# Patient Record
Sex: Male | Born: 1955 | Race: White | Hispanic: No | Marital: Married | Smoking: Former smoker
Health system: Southern US, Community
[De-identification: ages and names within clinical notes are randomized; demographics above are authoritative.]

## PROBLEM LIST (undated history)

## (undated) DIAGNOSIS — Q398 Other congenital malformations of esophagus: Secondary | ICD-10-CM

## (undated) DIAGNOSIS — Q399 Congenital malformation of esophagus, unspecified: Secondary | ICD-10-CM

## (undated) DIAGNOSIS — K2289 Other specified disease of esophagus: Secondary | ICD-10-CM

## (undated) DIAGNOSIS — M76821 Posterior tibial tendinitis, right leg: Secondary | ICD-10-CM

## (undated) DIAGNOSIS — K228 Other specified diseases of esophagus: Secondary | ICD-10-CM

## (undated) DIAGNOSIS — M069 Rheumatoid arthritis, unspecified: Secondary | ICD-10-CM

## (undated) DIAGNOSIS — J302 Other seasonal allergic rhinitis: Secondary | ICD-10-CM

## (undated) DIAGNOSIS — R945 Abnormal results of liver function studies: Secondary | ICD-10-CM

## (undated) DIAGNOSIS — J90 Pleural effusion, not elsewhere classified: Secondary | ICD-10-CM

## (undated) DIAGNOSIS — Z720 Tobacco use: Secondary | ICD-10-CM

## (undated) DIAGNOSIS — R7989 Other specified abnormal findings of blood chemistry: Secondary | ICD-10-CM

## (undated) HISTORY — DX: Other specified abnormal findings of blood chemistry: R79.89

## (undated) HISTORY — PX: CHOLECYSTECTOMY: SHX55

## (undated) HISTORY — DX: Congenital malformation of esophagus, unspecified: Q39.9

## (undated) HISTORY — PX: TONSILLECTOMY: SUR1361

## (undated) HISTORY — DX: Rheumatoid arthritis, unspecified: M06.9

## (undated) HISTORY — DX: Other seasonal allergic rhinitis: J30.2

## (undated) HISTORY — DX: Tobacco use: Z72.0

## (undated) HISTORY — DX: Other specified disease of esophagus: K22.89

## (undated) HISTORY — DX: Other specified diseases of esophagus: K22.8

## (undated) HISTORY — DX: Pleural effusion, not elsewhere classified: J90

## (undated) HISTORY — DX: Posterior tibial tendinitis, right leg: M76.821

## (undated) HISTORY — DX: Other congenital malformations of esophagus: Q39.8

## (undated) HISTORY — DX: Abnormal results of liver function studies: R94.5

---

## 2002-11-12 ENCOUNTER — Ambulatory Visit (HOSPITAL_COMMUNITY): Admission: RE | Admit: 2002-11-12 | Discharge: 2002-11-12 | Payer: Self-pay | Admitting: Family Medicine

## 2005-10-17 ENCOUNTER — Ambulatory Visit: Payer: Self-pay | Admitting: Internal Medicine

## 2005-11-02 ENCOUNTER — Ambulatory Visit: Payer: Self-pay | Admitting: Internal Medicine

## 2005-11-29 ENCOUNTER — Ambulatory Visit: Payer: Self-pay | Admitting: Internal Medicine

## 2005-12-13 ENCOUNTER — Encounter (INDEPENDENT_AMBULATORY_CARE_PROVIDER_SITE_OTHER): Payer: Self-pay | Admitting: Specialist

## 2005-12-13 ENCOUNTER — Ambulatory Visit: Payer: Self-pay | Admitting: Internal Medicine

## 2005-12-13 ENCOUNTER — Other Ambulatory Visit: Admission: RE | Admit: 2005-12-13 | Discharge: 2005-12-13 | Payer: Self-pay | Admitting: Internal Medicine

## 2005-12-20 ENCOUNTER — Ambulatory Visit: Payer: Self-pay | Admitting: Internal Medicine

## 2006-02-08 ENCOUNTER — Ambulatory Visit: Payer: Self-pay | Admitting: Internal Medicine

## 2006-02-09 ENCOUNTER — Emergency Department (HOSPITAL_COMMUNITY): Admission: EM | Admit: 2006-02-09 | Discharge: 2006-02-09 | Payer: Self-pay | Admitting: Emergency Medicine

## 2006-03-24 ENCOUNTER — Ambulatory Visit: Payer: Self-pay | Admitting: Internal Medicine

## 2006-03-28 ENCOUNTER — Ambulatory Visit (HOSPITAL_COMMUNITY): Admission: RE | Admit: 2006-03-28 | Discharge: 2006-03-28 | Payer: Self-pay | Admitting: General Surgery

## 2006-03-28 ENCOUNTER — Encounter (INDEPENDENT_AMBULATORY_CARE_PROVIDER_SITE_OTHER): Payer: Self-pay | Admitting: *Deleted

## 2006-06-05 ENCOUNTER — Ambulatory Visit: Payer: Self-pay | Admitting: Internal Medicine

## 2006-08-14 ENCOUNTER — Ambulatory Visit: Payer: Self-pay | Admitting: Internal Medicine

## 2006-08-28 ENCOUNTER — Ambulatory Visit: Payer: Self-pay | Admitting: Internal Medicine

## 2006-11-07 ENCOUNTER — Ambulatory Visit: Payer: Self-pay | Admitting: Internal Medicine

## 2006-12-12 DIAGNOSIS — J90 Pleural effusion, not elsewhere classified: Secondary | ICD-10-CM | POA: Insufficient documentation

## 2006-12-12 DIAGNOSIS — M069 Rheumatoid arthritis, unspecified: Secondary | ICD-10-CM | POA: Insufficient documentation

## 2007-02-22 ENCOUNTER — Ambulatory Visit: Payer: Self-pay | Admitting: Internal Medicine

## 2007-04-10 ENCOUNTER — Encounter: Payer: Self-pay | Admitting: Internal Medicine

## 2007-06-13 ENCOUNTER — Encounter: Payer: Self-pay | Admitting: Internal Medicine

## 2007-09-11 ENCOUNTER — Encounter: Payer: Self-pay | Admitting: Internal Medicine

## 2008-01-15 ENCOUNTER — Encounter: Payer: Self-pay | Admitting: Internal Medicine

## 2008-04-02 ENCOUNTER — Ambulatory Visit: Payer: Self-pay | Admitting: Internal Medicine

## 2008-06-03 ENCOUNTER — Ambulatory Visit: Payer: Self-pay | Admitting: Internal Medicine

## 2008-10-14 ENCOUNTER — Encounter: Payer: Self-pay | Admitting: Internal Medicine

## 2010-06-15 NOTE — Assessment & Plan Note (Signed)
[facility]                             PULMONARY OFFICE NOTE   REVIS, WHALIN                       [name]:          [id number]  DATE:08/14/2006                            DOB:          12-12-1955    HISTORY:  A 55 year old white male, intermittent smoker, with a left  pleural effusion dating back to November of 2007 with classic RA changes  by left thoracentesis December 13, 2005.  Since that time, he has had  occasional low-grade fevers and vague left chest discomfort, on 10 mg of  prednisone daily that came chronically but became acutely worse a weeks  ago with marked increased fever with chills and a hacking cough,  congested-sounding cough with increased left pleuritic pain.  His  prednisone was maintained the same at 10 mg per day, where he was given  Avelox for 7 days and returns today stating that he is feeling much  better.  He denies any significant dyspnea, myalgias, arthralgia and  overall feels his arthritis is well controlled on the 10 mg per day  Enbrel, per Dr. Katina Degree office.   PHYSICAL EXAMINATION:  GENERAL:  This is a pleasant ambulatory white  male in no acute distress.  VITAL SIGNS:  Appears to have normal vital signs.  HEENT:  Unremarkable.  Oropharynx clear.  Dentition is intact.  NECK:  Supple without cervical adenopathy or tenderness.  Trachea is  midline, no thyromegaly.  LUNGS:  Fields reveal dullness at the left base, but overall air  movement is adequate.  There are no bronchial changes, rales or wheezes.  HEART:  Regular rate and rhythm without murmur, gallop or rub.  ABDOMEN:  Soft, benign.  EXTREMITIES:  Without calf tenderness, cyanosis or clubbing or edema.   02 saturation 95% on room air.   Chest x-ray was reviewed from July 9 and shows no change and is small to  moderate left pleural effusion.   IMPRESSION:  He has a classic rheumatoid effusion that leaves his left  lung not fully aerated and at risk of  pneumonia, especially since he is  intermittently smoking, affecting mucociliary dysfunction.  Given the  fact that he has suppression in his immune system, both Enbrel and  prednisone, it is absolutely critical that he maintain better host  response from the perspective of eliminating cigarette and optimizing  mucociliary function.   The other issue is whether or not he needs to be tap dry again, to see  if the effusion will respond to more aggressive treatment (most  authorities on rheumatoid effusions feel tha prednisone is the best  choice, and it may well be that following either a sed rate or serial  chest x-ray will allow a more specific way to titrate the prednisone  than by symptoms alone.  Note  that the control of his non-pulmonary  disease appears to be quite satisfactory to him,  so adjusting the  prednisone may need a floor of 7.5 but a ceiling of perhaps as much as  40mg /day.   I have arranged to see him back in 2 weeks to  consider tapping him dry  and then starting over again with a baseline sed rate and serial chest x-  ray at that point.     Charlaine Dalton. Sherene Sires, MD, [facility]  Electronically Signed    MBW/MedQ  DD: 08/14/2006  DT: 08/15/2006  Job #: [id number]   cc:   Demetria Pore. Coral Spikes, M.D.

## 2010-06-15 NOTE — Assessment & Plan Note (Signed)
[facility]                             PULMONARY OFFICE NOTE   Grant Todd, Grant Todd                       [name]:          [id number]  DATE:08/28/2006                            DOB:          January 05, 1956    PULMONARY/FOLLOW-UP OFFICE VISIT:   HISTORY:  A 55 year old white male with longstanding rheumatoid  arthritis with documented rheumatoid effusion by thoracentesis December 13, 2005, with recent suspected pneumonia in the left base.  He returns  now still on prednisone at 10 mg a day with no symptoms at all except  for minimal chest congestion that occurs in the morning with minimal  mucoid sputum that has worsened since he resumed smoking.  He denies any  pleuritic pain, fevers, chills, sweats, and states his myalgias and  arthralgias have been eliminated on his present regimen with no  significant dyspnea or orthopnea or leg swelling.   For a full inventory of medications, please see face sheet column dated  August 28, 2006.   PHYSICAL EXAMINATION:  He is a robust, pleasant, ambulatory white male  in no acute distress.  Stable vital signs.  HEENT:  Unremarkable.  Pharynx clear.  There is minimum increase in breath sounds and dullness at the left  base.  There is a regular rate and rhythm without murmur, gallop, or rub.  ABDOMEN:  Soft, benign.  EXTREMITIES:  Warm without calf tenderness, cyanosis, clubbing or edema.   Chest x-ray shows only a very small left pleural effusion, which in  retrospect comparing x-rays is only minimally increased since his post-  thoracentesis x-ray.   IMPRESSION:  Small residual left pleural effusion, most likely  rheumatoid in nature.  Although we can probably tap several hundred  milliliters off of it, I do not believe this is going to make any  difference in the long run.   The main issues are as follows:  1. He absolutely needs to stop smoking.  In the meantime, he can treat      his cough with Mucinex or  Mucinex DM one to two q.12h.  2. Continue to control his systemic disease per Dr. Coral Spikes.  I would      not use the pleural effusion at this point as an indicator for      designing a lower floor for prednisone but rather his general      systemic rheumatologic activity.  If his effusion flares, on the      other hand, as his prednisone is tapered, I would like to see him      back here and then have an opportunity to tap him dry (it would be      an easier target then anyway) and then obtain a baseline      sedimentation rate perhaps and then titrate prednisone to both      sedimentation rate and recurrent pleural effusion severity.  This,      of course, would be a relatively coarse way to follow the problem      but probably the best option from a pulmonary perspective,  notwithstanding any changes in chronic systemic management that Dr.      Coral Spikes might consider in the meantime.     Charlaine Dalton. Sherene Sires, MD, [facility]  Electronically Signed    MBW/MedQ  DD: 08/28/2006  DT: 08/29/2006  Job #: [id number]   cc:   Demetria Pore. Coral Spikes, M.D.

## 2010-06-15 NOTE — Assessment & Plan Note (Signed)
[name]                             PULMONARY OFFICE NOTE   Grant Todd, Grant Todd                       [name]:          [id number]  DATE:11/07/2006                            DOB:          [birth year]    HISTORY:  A 55 year old white male, still actively smoking, in for  followup evaluation of a chronic pleural effusion that was documented to  be consistent with rheumatoid arthritis in November 2007.  He recently  had a flare up of arthritis and his prednisone was increased to 10 mg  daily.  He denies any increase in dyspnea or significant cough, chest  pain, fevers, chills, sweats, orthopnea, PND or leg swelling or  pleuritic pain.   PHYSICAL EXAMINATION:  He is a stoic, ambulatory white male in no acute  distress.  He is afebrile with normal vital signs.  HEENT:  Unremarkable.  Oropharynx clear.  Lung fields clear bilaterally to auscultation and percussion except for  minimum dullness at the left base.  There is regular rate and rhythm without murmurs, gallops, or rubs.  ABDOMEN:  Soft, benign.  EXTREMITIES:  Warm without calf tenderness, cyanosis, clubbing, or  edema.   Chest x-ray is pending.   IMPRESSION:  No evidence of clinically significant recurrent pleural  effusion.  At this point, I will recommend that his prednisone be  titrated to control arthritis, and if his effusion exacerbates on a dose  of prednisone that controls the rheumatoid symptoms, I would consider  tapping him dry and repeating the studies, as it would be very unusual  for rheumatoid effusion to exacerbate while the systemic disease is well  controlled.   I note that Dr. Coral Spikes is considering using methotrexate, but is  concerned about the effusion in terms of methotrexate pharmacokinetics.  I suppose in this circumstance one could consider tapping him dry at the  onset of using methotrexate with the presumed result that the  combination of prednisone and  methotrexate would prevent the effusion  from recurring.  The alternative would be to do a VATS pleurodesis,  which I think would be a very aggressive but probably effective way to  eliminate this problem completely (to my knowledge, we have not done  VATS pleurodesis in rheumatoid effusions here in [place], but  certainly could be considered if needed as a last resort).     Charlaine Dalton. Sherene Sires, MD, [facility]  Electronically Signed    MBW/MedQ  DD: 11/07/2006  DT: 11/08/2006  Job #: [id number]   cc:   Demetria Pore. Coral Spikes, M.D.

## 2010-06-18 NOTE — Assessment & Plan Note (Signed)
 [facility]                             PULMONARY OFFICE NOTE   Grant Todd                       [name]:          [id number]  DATE:02/08/2006                            DOB:          [birth year]    HISTORY:  This is a 55 year old white male with rheumatoid arthritis and  a large left pleural effusion of unknown chronicity (he says it has been  a long time since a chest x-ray was obtained before Dr. Coral Todd did an  initial evaluation for rheumatoid and found the effusion back in August  of 2007).   Subsequent left thoracentesis performed on December 13, 2005, showed  classic rheumatoid effusion changes and since then he has been on  prednisone 20 mg daily in addition to Enbrel. Dr. Coral Todd was concerned  about using methotrexate because of the pharmacokinetics of the drug in  patients with effusion. In any case, he comes back today feeling fine  with no pleuritic pain or significant increased symptoms of rheumatism.   His medications are reviewed in detail with him in the column dated  February 08, 2005. Note he is taking 5 mg tablets of prednisone, 2 in the  morning and 2 at night.   PHYSICAL EXAMINATION:  He is a pleasant, ambulatory, white male in no  acute distress. He has stable vital signs. Weight is 206 pounds, which  is up about 10 pounds from baseline.  HEENT: Is unremarkable. Oropharynx is clear.  LUNGS: Lung fields reveal minimum decrease in breath sounds with  crackles in the bases, left greater than right, dullness. Overall air  movement however is excellent.  No cough on inspiration.  He has a regular rate and rhythm without murmur, gallop or rub.  ABDOMEN: Soft, benign.  EXTREMITIES: Warm without calf tenderness, cyanosis, clubbing.   Chest x-ray shows only a very small left pleural effusion which is  improved compared to baseline.   IMPRESSION:  Classic rheumatoid effusion that is resolved with  prednisone. At this point, I  will recommend prednisone be tapered over  the next 6 weeks toward 10 mg per day and give the green light to Dr.  Coral Todd to treat Grant Todd underlying systemic inflammation in any way  he thinks is appropriate (the very small amount of effusion that is  present should not effect methotrexate clearance in any significant  way).   Followup will be in 6 weeks with a chest x-ray.    Charlaine Dalton. Sherene Sires, MD, [facility]  Electronically Signed   MBW/MedQ  DD: 02/08/2006  DT: 02/08/2006  Job #: [id number]   cc:   Grant Todd, M.D.

## 2010-06-18 NOTE — Assessment & Plan Note (Signed)
[facility]                               PULMONARY OFFICE NOTE   Grant Todd, Grant Todd                       [name]:          [id number]  DATE:11/29/2005                            DOB:          [birth year]    HISTORY:  Grant Todd is a 55 year old white male with active rheumatoid  arthritis with increasing symptoms of aches and pains associated with left  pleural effusion which is not pleuritic in nature.  He comes in for followup  today with no new symptoms for followup chest x-ray.  He denies any  orthopnea, PND, leg swelling, or dyspnea, sputum or cough.   MEDICATIONS:  Please see face sheet dated November 29, 2005.  He has been off  methotrexate now for over six weeks with increasing arthritic symptoms.   PHYSICAL EXAMINATION:  GENERAL:  He is a pleasant, ambulatory, white male in  no acute distress.  VITAL SIGNS:  Stable.  HEENT:  Unremarkable.  Oropharynx is clear.  CHEST:  Lung fields reveal diminished breath sounds with dullness to the  left base.  Overall air movement is adequate.  HEART:  Regular rhythm without murmur, gallop or rub.  ABDOMEN:  Soft, benign.  EXTREMITIES:  Warm without calf tenderness, cyanosis or clubbing or edema.   Chest x-ray is repeated and shows a moderate left pleural effusion.   IMPRESSION:  Probable rheumatoid effusion and is asymptomatic and relatively  stable.  This has persisted off methotrexate and, therefore, is not likely  related to methotrexate pleural disease.   Because he is a smoker, there is always a concern of course about  bronchogenic carcinoma and to tell them apart, I believe a pleural  thoracentesis could be considered.  The patient, however, agreed to a two-  week course of prednisone at 10 mg b.i.d. and the return for chest x-ray.  If the effusion responds to prednisone, a thoracentesis could be deferred.  Otherwise I have explained to the patient, the reasoning behind the  procedure and will  perform this at his next office visit in two weeks.    ______________________________  Charlaine Dalton Sherene Sires, MD, [facility]    MBW/MedQ  DD: 11/29/2005  DT: 11/30/2005  Job #: [id number]

## 2010-06-18 NOTE — Assessment & Plan Note (Signed)
Grant Todd                             PULMONARY OFFICE NOTE   Grant Todd, Grant Todd                       [name]:          [id number]  DATE:06/05/2006                            DOB:          1955-03-01    HISTORY:  A 55 year old white male with documented left pleural  effusions felt to be secondary to rheumatoid arthritis with classic  rheumatoid arthritis changes on previous thoracentesis on November 2007.  Acutely worse for the last week with pleuritic pain on the left for  several days associated with a hacking cough productive of slightly  discolored sputum. He denies any dyspnea over baseline.   The patient has stopped smoking effective December 2007 but is gaining  significant weight in the abdominal compartment. He denies any chronic  pleuritic pain or significant limiting dyspnea however.   For a full inventory of medications please see face sheet, dated Jun 05, 2006. He is still maintained on Enbrel and prednisone at 10 mg now daily  per Dr. Coral Todd.   PHYSICAL EXAMINATION:  GENERAL:  He is a moderately obese, pleasant,  ambulatory, white male in no acute distress.  VITAL SIGNS:  He had stable vital signs.  HEENT:  Adequate dentition. Oropharynx is clear.  NECK:  Supple without cervical adenopathy or tenderness. The trachea is  midline, no thyromegaly.  LUNGS:  Lung fields reveal minimal dullness at the left base only.  Overall air movement is adequate with minimal rhonchi.  HEART:  He has a regular rate and rhythm without murmur, gallop or rub.  ABDOMEN:  Obese, benign.  EXTREMITIES:  Warm without calf tenderness, cyanosis, clubbing or edema.   Chest x-ray shows minimal increase in left pleural effusion.   IMPRESSION:  1. Acute tracheobronchitis associated with vaguely pleuritic left-      sided chest discomfort a week ago. He still has slightly discolored      sputum but no evidence of pneumonia clinically or radiographically.  I recommended doxicycline 100 mg b.i.d. for what I perceived to be      significant underlying bronchitis related to chronic obstructive      pulmonary disease and his previous smoking history but do not      believe anything further needs to be done in this regard.  2. The pleural effusion is not significantly worse but there is      definitely a risk of  exacerbation with tapering of prednisone any      further, unless alternative antiinflammatories can control the      inflammation (the teaching on pleural effusions associated with      prednisone is that      prednisone is the drug of choice but with no real good alternative      agents in any trials that I      have seen to date). Followup can be every 3 months unless he has      symptomatic worsening.     Grant Todd. Grant Sires, MD, [facility]  Electronically Signed    MBW/MedQ  DD: 06/05/2006  DT: 06/06/2006  Job #: [id number]

## 2010-06-18 NOTE — Assessment & Plan Note (Signed)
[facility]                               PULMONARY OFFICE NOTE   Grant Todd, Grant Todd                       [name]:          [id number]  DATE:10/17/2005                            DOB:          [birth year]    REFERRING PHYSICIAN:  Demetria Pore. Coral Spikes, M.D.   REASON FOR CONSULTATION:  Pleural effusion.   HISTORY:  This is a very nice, 55 year old white male who has had rheumatoid  arthritis for 5 to 6 years and has been seeing multiple rheumatologist  because of insurance issues but comes in now with newly diagnosed right  pleural effusion.  He denies symptoms related to the effusion.  He does have  a sensation of chest congestion.  The extent has been present for several  years associated with minimal sputum production in the morning, no purulent  sputum or hemoptysis, pleuritic pain, significant dyspnea, orthopnea, PND,  or leg swelling.   Overall, he says his arthritis is about the same as usual.  Methotrexate  was stopped because of the concern it might be contributing to the effusion  but has not had any impact yet, which they stopped 3 weeks ago because of  the effusion but has not had any impact yet in terms of his rheumatoid  arthritis from his perspective.   PAST MEDICAL HISTORY:  Significant for the absence of additional complaints.   ALLERGIES:  SULFA CAUSES HIVES.   MEDICATIONS:  Methotrexate, Enbrel, prednisone, Sudafed, folic acid, fish  oil, and vitamins along with ibuprofen p.r.n.   SOCIAL HISTORY:  He continues to smoke a pack per day.  He has 1 drink a  month.  He denies any unusual travel, pet, or hobby exposure.  He is able  to work in [employer] up and down steps without any difficulty.   FAMILY HISTORY:  Significant for cancer in both parents.   REVIEW OF SYSTEMS:  Taken in detail and significant only for the problems  outlined above.   PHYSICAL EXAMINATION:  This is a robust, pleasant, ambulatory white male in  no acute  distress.  He had stable vital signs.  HEENT:  Unremarkable.  Pharynx is clear.  Dentition is intact.  NECK:  Supple without cervical adenopathy or tenderness.  Trachea is  midline.  No thyromegaly.  Lung fields reveal diminished breath sounds with dullness at the left base  only.  Overall airway is excellent.  There is a regular rhythm without murmur, gallop, or rub present.  ABDOMEN:  Soft benign without palpable organomegaly or masses or tenderness.  EXTREMITIES:  Warm without calf tenderness, cyanosis, clubbing, or edema.  He had typical rheumatoid changes without significant joint deformity in his  hands.   Hemoglobin saturation is 94% on room air.   Chest x-ray from August 30 shows a classic left pleural effusion with  layering on the lateral view.   IMPRESSION:  1. Chronic left pleural effusion presently asymptomatic in a patient with      longstanding rheumatoid arthritis.  The differential diagnoses includes      rheumatoid arthritis, methotrexate side effect (which typically  is      bilateral and not so asymmetric), a postpneumonic process or malignancy      process, which I think is very less likely.  2. The cough and congestion he is experiencing probably is directly      related to smoking and not interstitial lung disease from rheumatism      nor related to the effusion.  I recommended that he consider stopping      smoking but I do not believe he is committed in this regard yet.   RECOMMENDATION:  I would like to see the patient back in 2 weeks with a  followup chest x-ray to see what the progression of this effusion has been  on or off of methotrexate.  Clearly, if it worsens off methotrexate, it  mostly is rheumatoid arthritis, and if it improves, it was a methotrexate  side effect.  I think we will find the former is true and that a  thoracentesis should be diagnostic of a pleural effusion and will be  considered on his next visit in 2 weeks.                                    Charlaine Dalton. Sherene Sires, MD, [facility]   MBW/MedQ  DD:  10/18/2005  DT:  10/18/2005  Job #:  [id number]

## 2010-06-18 NOTE — Assessment & Plan Note (Signed)
[facility]                               PULMONARY OFFICE NOTE   DAMANTE, SPRAGG                       [name]:          [id number]  DATE:11/02/2005                            DOB:          [birth year]    HISTORY OF PRESENT ILLNESS:  The patient is a 55 year old white male with a  minimally symptomatic left pleural effusion and underlying rheumatoid  arthritis. He is also an active smoker. He comes in today for followup with  no significant specific symptoms other than congestion in his chest which  he says has been present for years and no recent change. He denies any  pleuritic pain, orthopnea or increased dyspnea over baseline.   He does state his arthritis is slightly worse since stopping methotrexate.   He denies any orthopnea, PND or leg swelling.   PHYSICAL EXAMINATION:  GENERAL:  He is a pleasant ambulatory white male in  no acute distress.  VITAL SIGNS:  Stable. Saturation 96% on room air.  HEENT:  Unremarkable, oropharynx clear. Dentition intact.  NECK:  Supple without cervical adenopathy, tenderness. Trachea is midline  ___________  LUNGS:  Lung fields reveal minimal dullness at the left base. Overall air  movement is adequate.  HEART:  Regular rhythm without murmur, gallop or rub.  ABDOMEN:  Soft.  EXTREMITIES:  Warm without calf tenderness, cyanosis, clubbing or edema.   Oxygen saturation 96% on room air.   Chest x-ray shows what appears to be a partially loculated left pleural  effusion with density projecting over the anterior portion of the chest and  also a very small left pleural effusion. I do not have any x-rays for apples  to apples comparison.   IMPRESSION:  Asymptomatic left pleural effusion. Probably represents a  rheumatoid effusion. In this setting I think it is fine to rechallenge with  methotrexate if needed for systemic control. If we see the effusion increase  while on methotrexate, the other possibility is  methotrexate-induced pleural  effusion.   I would like to recommend followup here every two weeks therefore or sooner  if he has increased symptoms of cough or dyspnea or pleuritic pain.   The differential diagnosis continues to include parapneumonic effusion and  malignancy (given the fact that he is a smoker), but the clinical pattern  appears quite benign.            ______________________________  Charlaine Dalton Sherene Sires, MD, [facility]      MBW/MedQ  DD:  11/02/2005  DT:  11/04/2005  Job #:  [id number]   cc:   Demetria Pore. Coral Spikes, M.D.

## 2010-06-18 NOTE — Assessment & Plan Note (Signed)
[facility]                             PULMONARY OFFICE NOTE   LINDLEY, HINEY                       [name]:          [id number]  DATE:03/24/2006                            DOB:          1955/12/18    HISTORY:  The patient is a 55 year old white male with rheumatoid  arthritis and a classic rheumatoid effusion by thoracentesis on December 13, 2005.  He appeared to have marked improvement in effusion following  thoracentesis that was sustained while on prednisone and now comes back,  having tapered prednisone down, feeling that his arthritis is a bit more  active, but overall doing fine with no pleuritic pain or dyspnea.   For full list of medications, please see face sheet, dated March 24, 2006, and note that he is on no pulmonary medicine.  He does complain of  mild URI with sensation of sinus congestion with slightly greenish nasal  discharge.   Note also, the patient is still smoking, although he is trying to  quit.   PHYSICAL EXAM:  He is a pleasant, ambulatory, white man, in no acute  distress.  Afebrile, normal vital signs.  HEENT:  Reveals moderate turbinate.  The oropharynx is clear.  No  evidence of excessive postnasal drainage or cobblestoning .  NECK:  Supple without cervical adenopathy or tenderness.  Trachea is  midline without thyromegaly.  LUNG FIELDS:  Reveal diminished breath sounds at the left base only with  minimal dullness to percussion.  There is regular rhythm without murmur,  gallop or rub.  No increase in P2.  ABDOMEN:  Soft and benign.  EXTREMITIES:  Warm without calf tenderness, cyanosis, or clubbing,  nodules or edema .   Hemoglobin saturation is 93% on room air.   Chest x-ray shows minimal left pleural effusion.   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  Since his pattern on thoracentesis is classic for rheumatoid  arthritis, I am going to defer management of this  issue, as well as,  obviously, his systemic rheumatism, to Dr. Katina Degree capable hands.  If  his arthritis and pleural effusion parallel one another in terms of  severity, there is nothing more to offer from a pulmonary perspective.  If, on the other hand, his rheumatoid symptoms get much better while the  effusion gets worse, then other possibilities need to be considered,  including neoplasm or drug effect, although I think this would be much  less likely.   Followup will be, therefore, in eight weeks with chest x-ray, sooner if  needed.    Charlaine Dalton. Sherene Sires, MD, [facility]  Electronically Signed   MBW/MedQ  DD: 03/24/2006  DT: 03/24/2006  Job #: [id number]

## 2010-06-18 NOTE — Op Note (Signed)
NAMEBRITTON, BERA                ACCOUNT NO.:  [id number]   MEDICAL RECORD NO.:  [id number]          PATIENT TYPE:  AMB   LOCATION:  DAY                          FACILITY:  [facility]   PHYSICIAN:  Timothy E. Earlene Plater, M.D. DATE OF BIRTH:  10-14-1955   DATE OF PROCEDURE:  03/28/2006  DATE OF DISCHARGE:                               OPERATIVE REPORT   PREOP DIAGNOSIS:  Cholecystolithiasis.Marland Kitchen   POSTOPERATIVE DIAGNOSIS:  Cholecystolithiasis.Marland Kitchen   PROCEDURE:  Laparoscopic cholecystectomy   SURGEON:  Timothy E. Earlene Plater, M.D.   ASSISTANT:  Leonie Man, M.D.   ANESTHESIA:  General.   HISTORY:  Grant Todd has had one severe attack of upper abdominal pain  with elevation of white count and liver function studies that has  resolved; and he has electively scheduled surgery at this time; and it  has been carefully explained.  He was seen, identified, and the permit  signed.   DESCRIPTION OF PROCEDURE:  He was taken to the operating room, placed  supine, and general endotracheal anesthesia was administered.  The  abdomen was clipped, prepped and draped in the usual fashion.  Then 1/4%  Marcaine with epinephrine was used prior to each incision.  An  infraumbilical incision made.  The fascia identified, opened in the  midline.  The peritoneum entered without complication.  The Hasson  catheter placed, tied in place with a #1 Vicryl.  The abdomen  insufflated.  The camera introduced and general peritoneoscopy was  unremarkable.  A second 10-mm trocar placed in the midepigastrium, and  two 5-mm trocars in the right upper quadrant.  The gallbladder was  thickened.  It had several omental adhesions; and these were taken down  bluntly.   The gallbladder was long, it was displayed by retraction careful  dissection at the base the gallbladder revealed an anterior artery over  the top of the fat; in the cystic duct area.  This was dissected free,  triply clipped, and divided.  Further dissection of the  fatty tissue  revealed a normal-appearing cystic duct entering the gallbladder.  This  was completely cleared, dissected out; a window created, and a clip  applied at the junction with the gallbladder.  This was opened; pure  clear bile ran freely.  A percutaneously passed catheter was introduced;  and after multiple attempts, the catheter could not be maintained within  the cystic duct so the operative cholangiogram was abandoned, the  remnant of the cystic duct doubly clipped; and it was divided.  To note,  his liver function studies today are normal.   Further dissection at the base of the gallbladder and junction with the  liver revealed a posterior artery which was doubly clipped and divided;  and the gallbladder was removed from the gallbladder bed without  complication or problem.  All areas irrigated, gallbladder placed in an  EndoCatch bag and then removed through the infraumbilical incision which  was then closed under direct vision.  All sites inspected, all irrigant,  CO2, instruments, and trocars  removed under direct vision.  Each skin wound inspected; and all closed  with  3-0 Monocryl.  All counts correct.  He tolerated it well.  Steri-  Strips, dry sterile bandages applied; and he was removed to the recovery  room in good condition.      Timothy E. Earlene Plater, M.D.  Electronically Signed     TED/MEDQ  D:  03/28/2006  T:  03/28/2006  Job:  [id number]

## 2010-06-18 NOTE — Assessment & Plan Note (Signed)
[facility]                               PULMONARY OFFICE NOTE   Grant Todd, Grant Todd                       [name]:          [id number]  DATE:12/20/2005                            DOB:          08-12-1955    HISTORY:  A 55 year old white male returns for followup evaluation of a left  pleural effusion a week ago with classic features consistent with an acute  rheumatoid effusion. That is, he had more neutrophils than lymphocytes with  marked decrease in pleural fluid glucose and marked increase in LDH. He was  placed empirically on 20 mg of prednisone a day and generally feels better  except that he feels a pressure when he takes a deep breath that he did  not have previously. He denies any frank pleuritic pain and he says his  arthritis is well too.   PHYSICAL EXAMINATION:  GENERAL:  He is a pleasant ambulatory white male in  no acute distress.  VITAL SIGNS:  He had stable vital signs.  HEENT:  Unremarkable. Pharynx clear.  LUNGS:  Lung fields reveal a rub at the left base.  HEART:  He has a regular rate and rhythm without murmur, gallop or rub.  ABDOMEN:  Soft and benign.  EXTREMITIES:  Warm without calf tenderness, cyanosis, clubbing or edema.   Chest x-ray shows only minimal left pleural effusion.   IMPRESSION:  Classic rheumatoid effusion acute in nature associated with low-  grade pleurisy that appears to have responded somewhat to prednisone therapy  although he still has a discomfort when he takes a deep breath. Note the  effusion really has not recurred since the pleuracentesis about a week ago.   My hope is that controlling the systemic rheumatoid inflammatory process  will control the effusion and for that purpose I believe methotrexate would  be appropriate if Dr. Coral Spikes wishes to restart it. However, I am going to  of course defer the management of his systemic rheumatoid arthritis to Dr.  Coral Spikes and see him back here in 4 weeks for  a final followup chest x-ray.  Will see him sooner if needed.     Charlaine Dalton. Sherene Sires, MD, [facility]  Electronically Signed    MBW/MedQ  DD: 12/20/2005  DT: 12/21/2005  Job #: [id number]   cc:   Thora Lance, M.D.  Demetria Pore. Coral Spikes, M.D.

## 2011-03-02 ENCOUNTER — Other Ambulatory Visit: Payer: Self-pay | Admitting: Internal Medicine

## 2011-03-02 DIAGNOSIS — F17200 Nicotine dependence, unspecified, uncomplicated: Secondary | ICD-10-CM

## 2011-03-02 DIAGNOSIS — Z129 Encounter for screening for malignant neoplasm, site unspecified: Secondary | ICD-10-CM

## 2011-03-08 ENCOUNTER — Ambulatory Visit
Admission: RE | Admit: 2011-03-08 | Discharge: 2011-03-08 | Disposition: A | Discharge status: Home / Self Care | Payer: No Typology Code available for payment source | Source: Ambulatory Visit | Attending: Internal Medicine | Admitting: Internal Medicine

## 2011-03-08 DIAGNOSIS — Z129 Encounter for screening for malignant neoplasm, site unspecified: Secondary | ICD-10-CM

## 2011-03-08 DIAGNOSIS — F17200 Nicotine dependence, unspecified, uncomplicated: Secondary | ICD-10-CM

## 2013-06-14 ENCOUNTER — Other Ambulatory Visit: Payer: Self-pay | Admitting: Internal Medicine

## 2013-06-14 DIAGNOSIS — E279 Disorder of adrenal gland, unspecified: Secondary | ICD-10-CM

## 2013-06-14 DIAGNOSIS — E278 Other specified disorders of adrenal gland: Secondary | ICD-10-CM

## 2013-06-14 DIAGNOSIS — R911 Solitary pulmonary nodule: Secondary | ICD-10-CM

## 2013-06-20 ENCOUNTER — Other Ambulatory Visit: Payer: Self-pay

## 2013-06-20 ENCOUNTER — Inpatient Hospital Stay: Admission: RE | Admit: 2013-06-20 | Payer: Self-pay | Source: Ambulatory Visit

## 2013-06-26 ENCOUNTER — Ambulatory Visit
Admission: RE | Admit: 2013-06-26 | Discharge: 2013-06-26 | Disposition: A | Discharge status: Home / Self Care | Payer: BC Managed Care – PPO | Source: Ambulatory Visit | Attending: Internal Medicine | Admitting: Internal Medicine

## 2013-06-26 DIAGNOSIS — E279 Disorder of adrenal gland, unspecified: Principal | ICD-10-CM

## 2013-06-26 DIAGNOSIS — E278 Other specified disorders of adrenal gland: Secondary | ICD-10-CM

## 2013-06-26 DIAGNOSIS — R911 Solitary pulmonary nodule: Secondary | ICD-10-CM

## 2015-07-01 ENCOUNTER — Other Ambulatory Visit: Payer: Self-pay | Admitting: Internal Medicine

## 2015-07-01 DIAGNOSIS — Z87891 Personal history of nicotine dependence: Secondary | ICD-10-CM

## 2015-07-08 ENCOUNTER — Inpatient Hospital Stay: Admission: RE | Admit: 2015-07-08 | Payer: Self-pay | Source: Ambulatory Visit

## 2015-08-12 ENCOUNTER — Other Ambulatory Visit: Payer: Self-pay | Admitting: Internal Medicine

## 2015-08-12 DIAGNOSIS — R911 Solitary pulmonary nodule: Secondary | ICD-10-CM

## 2015-08-13 ENCOUNTER — Ambulatory Visit
Admission: RE | Admit: 2015-08-13 | Discharge: 2015-08-13 | Disposition: A | Discharge status: Home / Self Care | Payer: PRIVATE HEALTH INSURANCE | Source: Ambulatory Visit | Attending: Internal Medicine | Admitting: Internal Medicine

## 2015-08-13 DIAGNOSIS — R911 Solitary pulmonary nodule: Secondary | ICD-10-CM

## 2016-12-15 ENCOUNTER — Other Ambulatory Visit: Payer: Self-pay | Admitting: Gastroenterology

## 2016-12-15 DIAGNOSIS — K2289 Other specified disease of esophagus: Secondary | ICD-10-CM

## 2016-12-15 DIAGNOSIS — K228 Other specified diseases of esophagus: Secondary | ICD-10-CM

## 2016-12-16 ENCOUNTER — Encounter: Payer: Self-pay | Admitting: Hematology

## 2016-12-16 ENCOUNTER — Ambulatory Visit
Admission: RE | Admit: 2016-12-16 | Discharge: 2016-12-16 | Disposition: A | Discharge status: Home / Self Care | Payer: PRIVATE HEALTH INSURANCE | Source: Ambulatory Visit | Attending: Gastroenterology | Admitting: Gastroenterology

## 2016-12-16 DIAGNOSIS — Q399 Congenital malformation of esophagus, unspecified: Secondary | ICD-10-CM | POA: Insufficient documentation

## 2016-12-16 DIAGNOSIS — K2289 Other specified disease of esophagus: Secondary | ICD-10-CM

## 2016-12-16 DIAGNOSIS — K228 Other specified diseases of esophagus: Secondary | ICD-10-CM

## 2016-12-16 MED ORDER — IOPAMIDOL (ISOVUE-300) INJECTION 61%
100.0000 mL | Freq: Once | INTRAVENOUS | Status: AC | PRN
  Administered 2016-12-16: 100 mL via INTRAVENOUS

## 2016-12-16 NOTE — Progress Notes (Signed)
  Oncology Nurse Navigator Documentation  Navigator Location: [facility] (12/16/16 1354)   )Navigator Encounter Type: Telephone (12/16/16 1354) Telephone: Incoming Call (12/16/16 1354)                     Treatment Phase: Abnormal Scans (12/16/16 1354) Barriers/Navigation Needs: Coordination of Care (12/16/16 1354)   Interventions: Coordination of Care (12/16/16 1354)  I received a telephone call from Dr. Servando Snare regarding this patient. Dr Everrett Coombe office had received an urgent request for patient to be seen after abnormal scans at [facility]. Dr. Laurann Montana is the patient's PCP. Dr. Servando Snare requested that Dr. Benay Spice take a look at this patient's case. I called Dr. Delene Ruffini office and left a message with Valetta Fuller, requesting that patient scans etc be sent over for Dr. Benay Spice to view. I will place a med/onc referral in Epic.          Acuity: Level 2 (12/16/16 1354)         Time Spent with Patient: 30 (12/16/16 1354)

## 2016-12-16 NOTE — Progress Notes (Signed)
  Oncology Nurse Navigator Documentation  Navigator Location: [facility] (12/16/16 1404)   )Navigator Encounter Type: Telephone (12/16/16 1404) Telephone: Outgoing Call (12/16/16 1404) Abnormal Finding Date: 12/15/16 (12/16/16 1404) Confirmed Diagnosis Date: 12/15/16 (12/16/16 1404)                   Barriers/Navigation Needs: Coordination of Care (12/16/16 1404)   Interventions: Referrals;Psycho-social support (12/16/16 1404)  I called patient to introduce myself and my role as GI Navigator. Patient states that he had a CT scan and endoscopy yesterday and understands that he has abnormal results.  I explained that I would be placing a referral for an Medical/Oncology appointment with Dr. Benay Spice and that someone would be calling him to get that scheduled. I also called [facility] and requested that the Endoscopy results be faxed to [place].           Acuity: Level 2 (12/16/16 1404)         Time Spent with Patient: 30 (12/16/16 1404)

## 2016-12-19 ENCOUNTER — Other Ambulatory Visit: Payer: Self-pay | Admitting: Radiation Oncology

## 2016-12-19 DIAGNOSIS — K2289 Other specified disease of esophagus: Secondary | ICD-10-CM

## 2016-12-19 DIAGNOSIS — K228 Other specified diseases of esophagus: Secondary | ICD-10-CM

## 2016-12-20 ENCOUNTER — Encounter: Payer: Self-pay | Admitting: *Deleted

## 2016-12-20 ENCOUNTER — Encounter: Payer: Self-pay | Admitting: Radiation Oncology

## 2016-12-20 ENCOUNTER — Ambulatory Visit
Admission: RE | Admit: 2016-12-20 | Discharge: 2016-12-20 | Disposition: A | Discharge status: Home / Self Care | Payer: PRIVATE HEALTH INSURANCE | Source: Ambulatory Visit | Attending: Internal Medicine | Admitting: Internal Medicine

## 2016-12-20 ENCOUNTER — Other Ambulatory Visit: Payer: Self-pay

## 2016-12-20 ENCOUNTER — Institutional Professional Consult (permissible substitution): Payer: PRIVATE HEALTH INSURANCE | Admitting: Cardiothoracic Surgery

## 2016-12-20 ENCOUNTER — Encounter: Payer: Self-pay | Admitting: Cardiothoracic Surgery

## 2016-12-20 ENCOUNTER — Other Ambulatory Visit: Payer: Self-pay | Admitting: Internal Medicine

## 2016-12-20 ENCOUNTER — Ambulatory Visit: Payer: PRIVATE HEALTH INSURANCE | Admitting: Hematology

## 2016-12-20 VITALS — BP 126/87 | HR 98 | Resp 16 | Ht 68.0 in | Wt 157.0 lb

## 2016-12-20 DIAGNOSIS — M545 Low back pain, unspecified: Secondary | ICD-10-CM

## 2016-12-20 DIAGNOSIS — K228 Other specified diseases of esophagus: Secondary | ICD-10-CM

## 2016-12-20 DIAGNOSIS — M069 Rheumatoid arthritis, unspecified: Secondary | ICD-10-CM | POA: Insufficient documentation

## 2016-12-20 DIAGNOSIS — K229 Disease of esophagus, unspecified: Secondary | ICD-10-CM

## 2016-12-20 DIAGNOSIS — K2289 Other specified disease of esophagus: Secondary | ICD-10-CM

## 2016-12-20 MED ORDER — GADOBENATE DIMEGLUMINE 529 MG/ML IV SOLN
14.0000 mL | Freq: Once | INTRAVENOUS | Status: AC | PRN
  Administered 2016-12-20: 14 mL via INTRAVENOUS

## 2016-12-20 NOTE — Progress Notes (Signed)
[address]       [place] [address]             [phone]                    [name] Grant Todd #[id number] Date of Birth: [birth year]  Referring: [name], MD Primary Care: [name], MD  Chief Complaint:    Chief Complaint  Patient presents with  . Esophageal Lesion,mass    endoscopy with bx 12/15/16, cta c/a/p 12/16/16, mri back today     History of Present Illness:    Grant Todd 61 y.o. male is seen in the office  today for evaluation of esophageal mass.  The patient notes least for this past 6 months low energy fatigue and shortness of breath.  The past several months he is noted increasing difficulty swallowing and painful swallowing, antireflux medication did not help.. On 1115 patient underwent upper GI endoscopy by Dr. 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  A CT scan of the chest was also done.  PET scan is pending. Patient also has noted lower back pain particularly to the right over the past several weeks.    Patient notes approximately 8 pound weight loss.  He denies any previous long-standing history of reflux.  He does have a history of rheumatoid arthritis has been on Enbrel for 10-12 years, prednisone for 18 years, Arava for 6 years      Current Activity/ Functional Status:  Patient is independent with mobility/ambulation, transfers, ADL's, IADL's.   Zubrod Score: At the time of surgery this patient's most appropriate activity status/level should be described as: [x]     0    Normal activity, no symptoms []     1    Restricted in physical strenuous activity but ambulatory, able to do out light work []     2    Ambulatory and capable of self care, unable to do work activities, up and about               >50 % of waking hours                              []     3    Only limited  self care, in bed greater than 50% of waking hours []     4    Completely disabled, no self care, confined to bed or chair []     5    Moribund   Past Medical History:  Diagnosis Date  . Elevated LFTs   . Esophageal mass   . Pleural effusion on left   . Posterior tibial tendon dysfunction, right   . Rheumatoid arthritis ([place])   . Seasonal allergic rhinitis   . Severe esophageal dysplasia   . Tobacco use     Past Surgical History:  Procedure Laterality Date  . CHOLECYSTECTOMY    . TONSILLECTOMY      Family History  Problem Relation Age of Onset  . Uterine cancer Mother   . Hyperlipidemia Father   . CVA Father   . Head & neck cancer Father   . Testicular cancer Son     Social History   Socioeconomic History  . Marital status: Married    Spouse name: Not  on file  . Number of children: Not on file  . Years of education: Not on file  . Highest education level: Not on file  Social Needs  . Financial resource strain: Not on file  . Food insecurity - worry: Not on file  . Food insecurity - inability: Not on file  . Transportation needs - medical: Not on file  . Transportation needs - non-medical: Not on file  Occupational History  . Occupation: [employer] Services operational officer  Tobacco Use  . Smoking status: Former Smoker    Types: Cigarettes    Last attempt to quit: 03/03/2001    Years since quitting: 15.8  . Smokeless tobacco: Never Used  Substance and Sexual Activity  . Alcohol use: Yes    Comment: social  . Drug use: No  . Sexual activity: Yes    Partners: Female    Comment: married  Other Topics Concern  . Not on file  Social History Narrative  . Not on file    Social History   Tobacco Use  Smoking Status Former Smoker  . Types: Cigarettes  . Last attempt to quit: 03/03/2001  . Years since quitting: 15.8  Smokeless Tobacco Never Used    Social History   Substance and Sexual Activity  Alcohol Use Yes   Comment: social     Allergies  Allergen Reactions  . Chantix  [Varenicline] Other (See Comments)    Vivid dreams and lethargic  . Shellfish Allergy Hives  . Sulfonamide Derivatives Rash    Current Outpatient Medications  Medication Sig Dispense Refill  . acetaminophen (TYLENOL 8 HOUR) 650 MG CR tablet Take 1,300 mg by mouth every 8 (eight) hours as needed for pain.    Marland Kitchen CARAFATE 1 GM/10ML suspension Take 1 g by mouth 2 (two) times daily.     Marland Kitchen HYDROcodone-acetaminophen (NORCO/VICODIN) 5-325 MG tablet Take 1 tablet by mouth every 6 (six) hours as needed for moderate pain.    Marland Kitchen leflunomide (ARAVA) 20 MG tablet Take 1 tablet by mouth daily.    . Multiple Vitamins-Minerals (MULTIVITAMIN ADULT PO) Take 1 tablet by mouth daily.    . Omega-3 Fatty Acids (FISH OIL) 1200 MG CAPS Take 2 capsules by mouth 2 (two) times daily.    . predniSONE (DELTASONE) 5 MG tablet Take 1 tablet by mouth daily.    . cyclobenzaprine (FLEXERIL) 5 MG tablet Take 1 tablet (5 mg total) by mouth 3 (three) times daily as needed for muscle spasms. 50 tablet 0  . dexamethasone (DECADRON) 4 MG tablet Take 2 tabs twice a day 20 tablet 0  . morphine (MS CONTIN) 30 MG 12 hr tablet Take 1 tablet (30 mg total) by mouth every 12 (twelve) hours. 60 tablet 0  . oxyCODONE (OXY IR/ROXICODONE) 5 MG immediate release tablet Take 1 tablet (5 mg total) by mouth every 4 (four) hours as needed for severe pain (Take 1-2 Tablets by mouth very 4 hours as needed for severe pain). 75 tablet 0   No current facility-administered medications for this visit.     Pertinent items are noted in HPI.   Review of Systems:     Cardiac Review of Systems: Y or N  Chest Pain [ n   ]  Resting SOB [ n  ] Exertional SOB  Blue.Reese ]  Orthopnea [ n]   Pedal Edema [ n]    Palpitations Florencio.Farrier ] Syncope  [ n ]   Presyncope [ n  ]  General Review of Systems: [Y] =  yes [  ]=no Constitional: recent weight change Blue.Reese  ];  Wt loss over the last 3 months [ 8 lb  ] anorexia [  ]; fatigue [ y ]; nausea [n ]; night sweats [n  ]; fever [n  ]; or  chills [n  ];          Dental: poor dentition[  ]; Last Dentist visit:   Eye : blurred vision [  ]; diplopia [   ]; vision changes [  ];  Amaurosis fugax[  ]; Resp: cough [  ];  wheezing[  ];  hemoptysis[  ]; shortness of breath[  ]; paroxysmal nocturnal dyspnea[  ]; dyspnea on exertion[  ]; or orthopnea[  ];  GI:  gallstones[  ], vomiting[y  ];  dysphagia[y ]; melena[  n];  hematochezia [n]; heartburn[  y];   Hx of  Colonoscopy[  y]; GU: kidney stones [  ]; hematuria[  ];   dysuria [  ];  nocturia[  ];  history of     obstruction [  ]; urinary frequency [  ]             Skin: rash, swelling[  ];, hair loss[  ];  peripheral edema[  ];  or itching[  ]; Musculosketetal: myalgias[  ];  joint swelling[  ];  joint erythema[  ];  joint pain[  ];  back pain[  ];  Heme/Lymph: bruising[  ];  bleeding[  ];  anemia[  ];  Neuro: TIA[ n];  headaches[  ];  stroke[n  ];  vertigo[  ];  seizures[  ];   paresthesias[  ];  difficulty walking[  ];  Psych:depression[  ]; anxiety[ n ];  Endocrine: diabetes[  ];  thyroid dysfunction[  ];  Immunizations: Flu up to date [ y ]; Pneumococcal up to date [ n ];  Other:  Physical Exam: BP 126/87 (BP Location: Left Arm, Patient Position: Sitting, Cuff Size: Normal)   Pulse 98 Comment: ON RA  Resp 16   Ht 5\' 8"  (1.727 m)   Wt 157 lb (71.2 kg)   SpO2 98% Comment: ON RA  BMI 23.87 kg/m   PHYSICAL EXAMINATION: General appearance: alert, cooperative and appears older than stated age Head: Normocephalic, without obvious abnormality, atraumatic Neck: no adenopathy, no carotid bruit, no JVD, supple, symmetrical, trachea midline and thyroid not enlarged, symmetric, no tenderness/mass/nodules Lymph nodes: Cervical, supraclavicular, and axillary nodes normal. Resp: clear to auscultation bilaterally Back: symmetric, no curvature. ROM normal. No CVA tenderness. Cardio: regular rate and rhythm, S1, S2 normal, no murmur, click, rub or gallop GI: soft, non-tender; bowel sounds  normal; no masses,  no organomegaly Extremities: extremities normal, atraumatic, no cyanosis or edema and Homans sign is negative, no sign of DVT Neurologic: Grossly normal Mild changes of the, fingers and hands consistent with rheumatoid arthritis  Diagnostic Studies & Laboratory data:     Recent Radiology Findings:   Mr Lumbar Spine W Wo Contrast  Result Date: 12/20/2016 CLINICAL DATA:  61 year old male recently diagnosed with esophageal cancer. Severe low back pain greater on the right and radiating to the right leg with weakness for 2 months. No known injury. EXAM: MRI LUMBAR SPINE WITHOUT AND WITH CONTRAST TECHNIQUE: Multiplanar and multiecho pulse sequences of the lumbar spine were obtained without and with intravenous contrast. CONTRAST:  74mL MULTIHANCE GADOBENATE DIMEGLUMINE 529 MG/ML IV SOLN COMPARISON:  CT chest abdomen and pelvis 12/16/2016 FINDINGS: Segmentation:  Normal as demonstrated on the recent CT. Alignment: Stable  vertebral height and alignment. Relatively maintained lumbar lordosis except for mild grade 1 anterolisthesis of L4 on L5 and trace retrolisthesis of L5 on S1. Vertebrae: In the central S1 vertebral body there is a small 6-7 mm round focus of decreased T1/T2 signal with enhancement and increased STIR signal. This was not evident on the recent CT. No similar bone lesion is identified elsewhere. However, there is bulky malignant retroperitoneal and prevertebral lymphadenopathy throughout the lower thoracic and lumbar spine. There is a superimposed 17 mm spiculated metastasis along the posterior margin of the left psoas muscle at the L5 level (series 15, image 18 and series 18, image 35. Nearby a left lateral endplate marrow edema at L5-S1 is favored to be degenerative in nature. There is similar degenerative appearing right lateral endplate marrow edema at L4-L5. Conus medullaris and cauda equina: Conus extends to the L1 level. No abnormal intradural enhancement. No nerve root  thickening. Conus and cauda equina appear normal. Paraspinal and other soft tissues: Bulky abdominal and pelvic retroperitoneal/ prevertebral lymphadenopathy as stated earlier. Small left lower so [place] muscle metastasis. No other paraspinal muscle metastasis identified. Disc levels: Lumbar spine degeneration is most pronounced: L4-L5: Mild anterolisthesis. Right eccentric circumferential disc bulge with endplate spurring and broad-based posterior component. Moderate facet and ligament flavum hypertrophy with trace left facet joint fluid. Multifactorial moderate to severe right L4 neural foraminal stenosis and mild right lateral recess stenosis. No significant spinal stenosis. L5-S1: Disc space loss with left eccentric circumferential disc bulge and endplate spurring. Mild facet hypertrophy. Moderate to severe left L5 neural foraminal stenosis. IMPRESSION: 1. Small 7 mm enhancing lesion in the central S1 vertebral body is nonspecific but suspicious for early osseous metastatic disease in this clinical setting. No other spinal or bone metastasis metastasis identified. 2. Bulky retroperitoneal and prevertebral malignant lymphadenopathy throughout the lower thoracic and lumbar spine. 3. Small posterior left psoas muscle metastasis measuring 17 mm at the left L5 level. No other paraspinal muscle metastasis identified. 4. L4-L5 and L5-S1 lumbar spine degeneration with mild spondylolisthesis, severe right L4 and left L5 neural foraminal stenosis, but no significant spinal stenosis. Electronically Signed   By: Genevie Ann M.D.   On: 12/20/2016 16:36   Ct Abdomen Pelvis W Contrast/ Ct Chest W Contrast  Result Date: 12/16/2016 CLINICAL DATA:  61 year old male with esophageal mass identified on endoscopy yesterday. Recent weight loss. EXAM: CT CHEST, ABDOMEN, AND PELVIS WITH CONTRAST TECHNIQUE: Multidetector CT imaging of the chest, abdomen and pelvis was performed following the standard protocol during bolus administration of  intravenous contrast. CONTRAST:  185mL ISOVUE-300 IOPAMIDOL (ISOVUE-300) INJECTION 61% COMPARISON:  08/13/2015 and prior CTs. FINDINGS: CT CHEST FINDINGS Cardiovascular: Upper limits normal heart size noted. Mild coronary artery calcifications identified. No evidence of thoracic aortic aneurysm or pericardial effusion. Mediastinum/Nodes: Circumferential wall thickening of the distal esophagus is noted. Multiple large lymph nodes are identified throughout the mediastinum and right hilum with index nodes as follows (series 3): A 3.5 cm right peritracheal node (image 24) 83 cm right hilar node (image 31) A 3.5 cm subcarinal node (image 33). Lungs/Pleura: Mild airspace/ground-glass opacity within the inferior right lower lobe likely represents a small area of infection or possibly aspiration. Mild bibasilar atelectasis/ scarring noted. A small loculated left pleural effusion and associated mild left atelectasis is unchanged. Centrilobular emphysema is again noted. Musculoskeletal: The no acute bony abnormalities or suspicious bony lesions identified. CT ABDOMEN PELVIS FINDINGS Hepatobiliary: A 3.5 x 4.5 cm inferior hepatic mass (junction of the right  and left hepatic lobes, image 71 series 3) and a 1.5 cm inferior right hepatic mass (image 76, series 3) likely represent metastases. Remainder of the liver is unremarkable. The patient is status post cholecystectomy. No biliary dilatation. Pancreas: Unremarkable Spleen: Unremarkable Adrenals/Urinary Tract: The kidneys, adrenal glands and bladder are unremarkable. Stomach/Bowel: There is no evidence of bowel obstruction. Colonic diverticulosis noted without evidence of diverticulitis. Distal esophageal wall thickening is again noted without other bowel wall thickening identified. The appendix is normal. Vascular/Lymphatic: Multiple enlarged lymph nodes in the upper abdomen, retroperitoneum and mesenteric noted with index nodes as follows (series 3): A 3.1 x 5.5 cm portal  caval nodal mass (image 63) A 2.5 cm left periaortic node (image 71) A 2.8 cm mesenteric node (image 87) Aortic atherosclerotic calcifications are noted without aneurysm. Reproductive: Prostate enlargement noted. Other: No ascites, pneumoperitoneum or abscess. Small bilateral inguinal hernias containing fat are present. Musculoskeletal: No acute bony abnormalities or suspicious bony lesions identified. Mild degenerative changes in the lumbar spine are present. IMPRESSION: 1. Diffuse circumferential wall thickening of the distal esophagus likely representing the mass visualized on recent endoscopy and highly suspicious for malignancy. 2. Extensive chest and abdominal lymphadenopathy with 2 hepatic masses, compatible with metastatic disease. 3. Small area of airspace disease/ground-glass opacity within the right upper lobe, likely representing aspiration or pneumonia. 4. Aortic Atherosclerosis (ICD10-I70.0) and Emphysema (ICD10-J43.9). These results will be called to the ordering clinician or representative by the Radiologist Assistant, and communication documented in the PACS or zVision Dashboard. Electronically Signed   By: Margarette Canada M.D.   On: 12/16/2016 14:14     I have independently reviewed the above radiologic studies.  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 With clinical stage IV esophageal cancer, surgical resection is not a reasonable approach, currently the patient is able to take a p.o. diet reasonably well. He is to see Dr. Benay Spice in the very near future to arrange radiation and chemotherapy treatment.      I  spent 30 minutes counseling the patient face to face and 50%  or more the  time was spent in counseling and coordination of care. The total time spent in the appointment was 45 minutes.  Grace Isaac MD      [address] [place] [address] Office [phone]   Beeper [phone]  12/26/2016 5:29 PM

## 2016-12-21 ENCOUNTER — Telehealth: Payer: Self-pay | Admitting: Oncology

## 2016-12-21 ENCOUNTER — Other Ambulatory Visit: Payer: Self-pay | Admitting: Emergency Medicine

## 2016-12-21 ENCOUNTER — Ambulatory Visit (HOSPITAL_COMMUNITY)
Admission: RE | Admit: 2016-12-21 | Discharge: 2016-12-21 | Disposition: A | Discharge status: Home / Self Care | Payer: PRIVATE HEALTH INSURANCE | Source: Ambulatory Visit | Attending: Oncology | Admitting: Oncology

## 2016-12-21 ENCOUNTER — Ambulatory Visit (HOSPITAL_BASED_OUTPATIENT_CLINIC_OR_DEPARTMENT_OTHER): Payer: PRIVATE HEALTH INSURANCE | Admitting: Oncology

## 2016-12-21 VITALS — BP 125/83 | HR 91 | Temp 97.8°F | Resp 18 | Ht 68.0 in | Wt 160.9 lb

## 2016-12-21 DIAGNOSIS — Q399 Congenital malformation of esophagus, unspecified: Secondary | ICD-10-CM

## 2016-12-21 DIAGNOSIS — C155 Malignant neoplasm of lower third of esophagus: Secondary | ICD-10-CM

## 2016-12-21 DIAGNOSIS — M1611 Unilateral primary osteoarthritis, right hip: Secondary | ICD-10-CM | POA: Insufficient documentation

## 2016-12-21 DIAGNOSIS — J90 Pleural effusion, not elsewhere classified: Secondary | ICD-10-CM | POA: Diagnosis not present

## 2016-12-21 DIAGNOSIS — M069 Rheumatoid arthritis, unspecified: Secondary | ICD-10-CM

## 2016-12-21 DIAGNOSIS — G893 Neoplasm related pain (acute) (chronic): Secondary | ICD-10-CM

## 2016-12-21 MED ORDER — OXYCODONE-ACETAMINOPHEN 5-325 MG PO TABS
2.0000 | ORAL_TABLET | Freq: Once | ORAL | Status: AC
  Administered 2016-12-21: 2 via ORAL

## 2016-12-21 MED ORDER — OXYCODONE-ACETAMINOPHEN 5-325 MG PO TABS
ORAL_TABLET | ORAL | Status: AC
  Filled 2016-12-21: qty 2

## 2016-12-21 MED ORDER — OXYCODONE HCL 5 MG PO TABS: 5.0000 mg | ORAL_TABLET | ORAL | 0 refills | Status: DC | PRN

## 2016-12-21 NOTE — Progress Notes (Signed)
Grant Todd Patient Consult   Referring MD: [name], Md [address], [place] [address]   Grant Todd 61 y.o.  11/10/1955    Reason for Referral: Esophagus cancer   HPI: Grant Todd reports progressive dysphagia over several months.  He saw Grant Todd.  Antiacid therapy did not help.  He was referred to Grant Todd and was taken to an upper endoscopy procedure 12/15/2016.  A mass was noted in the lower third of the esophagus at 40-45 cm from the incisors.  The mass was partially obstructing.  Biopsies were obtained. The pathology [id number]) revealed invasive poorly differentiated adenocarcinoma.  HER-2 testing is pending.  CTs of the chest, abdomen, and pelvis on 12/16/2016 revealed circumferential wall thickening of the distal esophagus.  Extensive chest and abdominal lymphadenopathy including mediastinal, right hilar, abdominal, and retroperitoneal nodes.  2 liver masses.  No bone lesions.   He reports the onset of severe low back pain beginning 12/17/2016.  The pain is not relieved with hydrocodone.  Pain has been present at the right "hip "and upper leg for the past few days.  He has noted weakness of the right leg versus guarding secondary to pain with movement of the right leg.  An MRI of the lumbar spine 12/20/2016 revealed a 7 mm and at the S1 vertebral body, bulky retroperitoneal and prevertebral malignant lymphadenopathy, a 17 mm lesion at the left psoas.  Severe right L4 and left L5 neural foraminal stenosis.    Past Medical History:  Diagnosis Date  . Elevated LFTs   . Esophageal mass  November 2018  . Pleural effusion on left   . Posterior tibial tendon dysfunction, right   . Rheumatoid arthritis ([place])   . Seasonal allergic rhinitis   . Severe esophageal dysplasia   . Tobacco use    Past surgical history: 1.  Cholecystectomy 2.  Tonsillectomy   Medications: Reviewed  Allergies:  Allergies  Allergen  Reactions  . Chantix [Varenicline] Other (See Comments)    Vivid dreams and lethargic  . Shellfish Allergy Hives  . Sulfonamide Derivatives Rash    Family history: Had fallopian tube carcinoma, his father had salivary gland carcinoma, his son had testicular cancer  Social History:   He lives in [place].  He works as a Actor.  He quit smoking cigarettes 6 years ago.  Rare alcohol use.  No transfusion history.  No risk factor for HIV or hepatitis.  ROS:   Positives include: Dysphasia, 8 pound weight loss, nocturia x1, constipation since taking narcotics-relieved with MiraLAX and Senokot, joint swelling and stiffness in humid weather, severe low back pain for several months-sudden worsening 12/17/2016, right leg pain and right leg weakness beginning 12/17/2016  A complete ROS was otherwise negative.  Physical Exam:  Blood pressure 125/83, pulse 91, temperature 97.8 F (36.6 C), temperature source Oral, resp. rate 18, height _0  (1.727 m), weight 160 lb 14.4 oz (73 kg), SpO2 97 %.  HEENT: Neck without mass Lungs: Clear bilaterally, no respiratory distress Cardiac: Regular rate and rhythm Abdomen: No hepatosplenomegaly, no mass, nontender GU: Testes without mass Vascular: No leg edema Lymph nodes: No cervical, supraclavicular, left axillary, or inguinal nodes.  "Shotty "right axillary nodes Neurologic: Alert and oriented, the motor exam appears intact in the upper and lower extremities bilaterally.  Diminished deep tendon reflex at the right knee Skin: No rash Musculoskeletal: No spine tenderness, mild tenderness at the right trochanter and right iliac.  No pain with motion of the right hip     Imaging:  Mr Lumbar Spine W Wo Contrast  Result Date: 12/20/2016 CLINICAL DATA:  61 year old male recently diagnosed with esophageal cancer. Severe low back pain greater on the right and radiating to the right leg with weakness for 2 months. No known injury.  EXAM: MRI LUMBAR SPINE WITHOUT AND WITH CONTRAST TECHNIQUE: Multiplanar and multiecho pulse sequences of the lumbar spine were obtained without and with intravenous contrast. CONTRAST:  36m MULTIHANCE GADOBENATE DIMEGLUMINE 529 MG/ML IV SOLN COMPARISON:  CT chest abdomen and pelvis 12/16/2016 FINDINGS: Segmentation:  Normal as demonstrated on the recent CT. Alignment: Stable vertebral height and alignment. Relatively maintained lumbar lordosis except for mild grade 1 anterolisthesis of L4 on L5 and trace retrolisthesis of L5 on S1. Vertebrae: In the central S1 vertebral body there is a small 6-7 mm round focus of decreased T1/T2 signal with enhancement and increased STIR signal. This was not evident on the recent CT. No similar bone lesion is identified elsewhere. However, there is bulky malignant retroperitoneal and prevertebral lymphadenopathy throughout the lower thoracic and lumbar spine. There is a superimposed 17 mm spiculated metastasis along the posterior margin of the left psoas muscle at the L5 level (series 15, image 18 and series 18, image 35. Nearby a left lateral endplate marrow edema at L5-S1 is favored to be degenerative in nature. There is similar degenerative appearing right lateral endplate marrow edema at L4-L5. Conus medullaris and cauda equina: Conus extends to the L1 level. No abnormal intradural enhancement. No nerve root thickening. Conus and cauda equina appear normal. Paraspinal and other soft tissues: Bulky abdominal and pelvic retroperitoneal/ prevertebral lymphadenopathy as stated earlier. Small left lower so uKoreamuscle metastasis. No other paraspinal muscle metastasis identified. Disc levels: Lumbar spine degeneration is most pronounced: L4-L5: Mild anterolisthesis. Right eccentric circumferential disc bulge with endplate spurring and broad-based posterior component. Moderate facet and ligament flavum hypertrophy with trace left facet joint fluid. Multifactorial moderate to severe  right L4 neural foraminal stenosis and mild right lateral recess stenosis. No significant spinal stenosis. L5-S1: Disc space loss with left eccentric circumferential disc bulge and endplate spurring. Mild facet hypertrophy. Moderate to severe left L5 neural foraminal stenosis. IMPRESSION: 1. Small 7 mm enhancing lesion in the central S1 vertebral body is nonspecific but suspicious for early osseous metastatic disease in this clinical setting. No other spinal or bone metastasis metastasis identified. 2. Bulky retroperitoneal and prevertebral malignant lymphadenopathy throughout the lower thoracic and lumbar spine. 3. Small posterior left psoas muscle metastasis measuring 17 mm at the left L5 level. No other paraspinal muscle metastasis identified. 4. L4-L5 and L5-S1 lumbar spine degeneration with mild spondylolisthesis, severe right L4 and left L5 neural foraminal stenosis, but no significant spinal stenosis. Electronically Signed   By: HGenevie AnnM.D.   On: 12/20/2016 16:36   CT images from 12/16/2016-reviewed   Assessment/Plan:   1. Metastatic esophagus cancer  Distal esophagus mass at upper endoscopy 12/15/2016, biopsy confirmed invasive poorly differentiated adenocarcinoma  CTs of 02/16/2016-distal esophagus thickening, mediastinal/right hilar lymphadenopathy, abdominal/retroperitoneal lymphadenopathy, liver metastases  MRI lumbar spine 12/20/2016-7 mm S1 lesion suspicious for metastasis, bulky retroperitoneal and prevertebral malignant lymphadenopathy, left so as muscle metastasis, degenerative disease causing severe neural foraminal stenosis at right L4 and left L5 2. Pain secondary to metastatic esophagus cancer- the pain may be related to retroperitoneal lymphadenopathy or bone metastases  3.  Rheumatoid arthritis  4.  Dysphagia/weight loss secondary to #1  5.  Chronic left pleural effusion  Disposition:   Grant Todd has been diagnosed with esophagus cancer.  He appears to have metastatic  disease with extensive chest/abdominal lymphadenopathy and liver metastases.  I discussed the prognosis and treatment options with Grant Todd and his wife.  We reviewed the CT and MRI images.  He will be referred for biopsy of a liver lesion to confirm metastatic disease and obtain additional tissue for molecular testing.  I recommend initial systemic treatment with FOLFOX chemotherapy.  We reviewed the treatment schedule with the FOLFOX regimen and need for placement of a Port-A-Cath.  He has severe pain today.  It is unclear whether the pain is related to retroperitoneal lymphadenopathy or unrecognized bone metastases.  The pain was relieved with oxycodone in the office today.  He was prescribed oxycodone.  We will refer him for a plain x-ray of the right hip today.  He has been scheduled for a PET scan on 01/06/2017.  I will refer him for a bone scan if we cannot get the PET scan scheduled for an earlier date.  He may be candidate for palliative radiation if he is found to have a symptomatic bone metastasis.  It is unclear whether immune modulating treatment for rheumatoid arthritis increased his risk for developing esophagus cancer.  These agents are most commonly associated with hematopoietic malignancies.  He will continue prednisone and discontinue Enbrel and Arava.  He will be referred for Port-A-Cath placement and liver biopsy for soon as possible.  He will return for an office visit 12/23/2016.  The goal is to initiate systemic therapy during the week of 12/26/2016.  60 minutes were spent with the patient today.  The majority of the time was used for counseling and coordination of care.    Betsy Coder, MD  12/21/2016, 11:57 AM

## 2016-12-21 NOTE — Progress Notes (Signed)
  Oncology Nurse Navigator Documentation Met with patient and his wife during his initial med/onc appointment with Dr. Benay Spice. IPET scan scheduled for 01/06/17 @ 12 PM. Encouragement and support provided.      )

## 2016-12-21 NOTE — Telephone Encounter (Signed)
Gave avs and calendar for November and December  °

## 2016-12-23 ENCOUNTER — Ambulatory Visit (HOSPITAL_BASED_OUTPATIENT_CLINIC_OR_DEPARTMENT_OTHER): Payer: PRIVATE HEALTH INSURANCE | Admitting: Nurse Practitioner

## 2016-12-23 ENCOUNTER — Encounter: Payer: Self-pay | Admitting: Nurse Practitioner

## 2016-12-23 ENCOUNTER — Other Ambulatory Visit: Payer: Self-pay | Admitting: Nurse Practitioner

## 2016-12-23 ENCOUNTER — Telehealth: Payer: Self-pay | Admitting: Oncology

## 2016-12-23 VITALS — BP 103/61 | HR 115 | Temp 98.1°F | Resp 18 | Ht 68.0 in | Wt 163.7 lb

## 2016-12-23 DIAGNOSIS — J90 Pleural effusion, not elsewhere classified: Secondary | ICD-10-CM

## 2016-12-23 DIAGNOSIS — M5126 Other intervertebral disc displacement, lumbar region: Secondary | ICD-10-CM

## 2016-12-23 DIAGNOSIS — C155 Malignant neoplasm of lower third of esophagus: Secondary | ICD-10-CM | POA: Diagnosis not present

## 2016-12-23 DIAGNOSIS — Z7189 Other specified counseling: Secondary | ICD-10-CM | POA: Insufficient documentation

## 2016-12-23 DIAGNOSIS — M069 Rheumatoid arthritis, unspecified: Secondary | ICD-10-CM | POA: Diagnosis not present

## 2016-12-23 DIAGNOSIS — G893 Neoplasm related pain (acute) (chronic): Secondary | ICD-10-CM

## 2016-12-23 DIAGNOSIS — K228 Other specified diseases of esophagus: Secondary | ICD-10-CM

## 2016-12-23 DIAGNOSIS — K2289 Other specified disease of esophagus: Secondary | ICD-10-CM

## 2016-12-23 MED ORDER — CYCLOBENZAPRINE HCL 5 MG PO TABS: 5.0000 mg | ORAL_TABLET | Freq: Three times a day (TID) | ORAL | 0 refills | Status: DC | PRN

## 2016-12-23 MED ORDER — MORPHINE SULFATE ER 30 MG PO TBCR: 30.0000 mg | EXTENDED_RELEASE_TABLET | Freq: Two times a day (BID) | ORAL | 0 refills | Status: DC

## 2016-12-23 MED ORDER — DEXAMETHASONE 4 MG PO TABS: ORAL_TABLET | ORAL | 0 refills | Status: DC

## 2016-12-23 NOTE — Progress Notes (Signed)
START ON PATHWAY REGIMEN - Gastroesophageal     A cycle is every 14 days:     Oxaliplatin      Leucovorin      5-Fluorouracil      5-Fluorouracil   **Always confirm dose/schedule in your pharmacy ordering system**    Patient Characteristics: Distant Metastases (cM1/pM1) / Locally Recurrent Disease, Adenocarcinoma - Esophageal, GE Junction, and Gastric, First Line, HER2 Negative / Unknown Histology: Adenocarcinoma Disease Classification: Esophageal Therapeutic Status: Distant Metastases (No Additional Staging) Would you be surprised if this patient died  in the next year<= I would NOT be surprised if this patient died in the next year Line of Therapy: First Line HER2 Status: Awaiting Test Results Intent of Therapy: Non-Curative / Palliative Intent, Discussed with Patient

## 2016-12-23 NOTE — Progress Notes (Addendum)
[facility] OFFICE PROGRESS NOTE   Diagnosis: Esophagus cancer  INTERVAL HISTORY:   Grant Todd returns as scheduled.  He continues to have right low back/hip pain.  Oxycodone is partially effective.  He tried Flexeril and noted more improvement.  He thinks the right leg is weak.  No numbness.  No bowel or bladder dysfunction.  Objective:  Vital signs in last 24 hours:  Blood pressure 103/61, pulse (!) 115, temperature 98.1 F (36.7 C), temperature source Oral, resp. rate 18, height 5\' 8"  (1.727 m), weight 163 lb 11.2 oz (74.3 kg), SpO2 97 %.    HEENT: No thrush or ulcers. Resp: Lungs clear bilaterally. Cardio: Regular rate and rhythm. GI: No hepatosplenomegaly.  No mass. Vascular: No leg edema. Neuro: Weakness right proximal leg.  Unable to elicit right knee DTR. Skin: No rash.   Lab Results:  No results found for: WBC, HGB, HCT, MCV, PLT, NEUTROABS  Imaging:  Dg Hip Unilat With Pelvis 2-3 Views Right  Result Date: 12/22/2016 CLINICAL DATA:  Pain for 8 weeks.  Esophageal carcinoma. EXAM: DG HIP (WITH OR WITHOUT PELVIS) 2-3V RIGHT COMPARISON:  None. FINDINGS: Frontal pelvis as well as frontal and lateral right hip images were obtained. No fracture or dislocation. There is slight subchondral cystic change in the right hip joint. There is no appreciable joint space narrowing or erosion. No blastic or lytic bone lesions. Sacroiliac joints appear symmetric and normal bilaterally. IMPRESSION: Slight osteoarthritic change in the right hip joint. No fracture or dislocation. No blastic or lytic bone lesions evident. Electronically Signed   By: Lowella Grip III M.D.   On: 12/22/2016 09:45    Medications: I have reviewed the patient's current medications.  Assessment/Plan: 1. Metastatic esophagus cancer ? Distal esophagus mass at upper endoscopy 12/15/2016, biopsy confirmed invasive poorly differentiated adenocarcinoma ? CTs of 02/16/2016-distal esophagus  thickening, mediastinal/right hilar lymphadenopathy, abdominal/retroperitoneal lymphadenopathy, liver metastases ? MRI lumbar spine 12/20/2016-7 mm S1 lesion suspicious for metastasis, bulky retroperitoneal and prevertebral malignant lymphadenopathy, left psoas muscle metastasis, degenerative disease causing severe neural foraminal stenosis at right L4 and left L5 2. Pain secondary to metastatic esophagus cancer- the pain may be related to retroperitoneal lymphadenopathy or bone metastases; plain x-ray right hip 12/21/2016 with no blastic or lytic bone lesions.  No fracture or dislocation.  3.  Rheumatoid arthritis  4.  Dysphagia/weight loss secondary to #1  5.  Chronic left pleural effusion   Disposition: Grant Todd continues to have significant right low back pain.  Oxycodone provides partial relief.  He will begin MS Contin 30 mg every 12 hours and Flexeril 5 mg 3 times a day as needed.  He will also begin dexamethasone 8 mg twice daily.  His PET scan has been moved up to 12/27/2016.  We will see him in follow-up on 12/28/2016 to review the results.  He will contact the office in the interim with any problems.  We specifically discussed worsening pain, progressive right leg weakness, bowel/bladder dysfunction.  Patient seen with Dr. Benay Spice.  25 minutes were spent face-to-face at today's visit with the majority of that time involved in counseling/coordination of care.  Ned Card ANP/GNP-BC   12/23/2016  9:58 AM  This was a shared visit with Ned Card.  Grant Todd was the plain x-ray of the right hip did not reveal a metastatic lesion.  I reviewed the lumbar MRI with a radiologist.  The right L4 disc herniation appears to be responsible for the right leg pain and  weakness.  We will make a neurosurgical referral if he does not improve on Decadron.  He will return for an office visit with the plan to begin chemotherapy next week.  We will try to expedite the liver biopsy and port  placement.  Julieanne Manson, MD

## 2016-12-23 NOTE — Telephone Encounter (Signed)
Gave avs and calendar for December and waiting for 11/28

## 2016-12-26 ENCOUNTER — Encounter: Payer: Self-pay | Admitting: Oncology

## 2016-12-26 ENCOUNTER — Telehealth: Payer: Self-pay | Admitting: Nurse Practitioner

## 2016-12-26 NOTE — Progress Notes (Addendum)
GI Location of Tumor / Histology: Esophagus distal  Grant Todd presented  months ago with symptoms of: right low back pain, ,dysphasia  Biopsies of  (if applicable) revealed: 09/01/21: Esophagus distal bx: Invasive poorly differentiated adenocarcinoma,her-2 testing to be sent to cbl path./lab  Past/Anticipated interventions by surgeon, if any: U/S biopy and power port placement 01/04/17  Past/Anticipated interventions by medical oncology, if any:  Dr. Benay Spice appt 12/23/16:dysphasia/weight loss , f chemo education 12/27/16; follow up 12/28/16  Assessment/Plan: 1. Metastatic esophagus cancer ? Distal esophagus mass at upper endoscopy 12/15/2016, biopsy confirmed invasive poorly differentiated adenocarcinoma ? CTs of 02/16/2016-distal esophagus thickening, mediastinal/right hilar lymphadenopathy, abdominal/retroperitoneal lymphadenopathy, liver metastases ? MRI lumbar spine 12/20/2016-7 mm S1 lesion suspicious for metastasis, bulky retroperitoneal and prevertebral malignant lymphadenopathy, left psoas muscle metastasis, degenerative disease causing severe neural foraminal stenosis at right L4 and left L5 2. Pain secondary to metastatic esophagus cancer- the pain may be related to retroperitoneal lymphadenopathy or bone metastases; plain x-ray right hip 12/21/2016 with no blastic or lytic bone lesions.  No fracture or dislocation.     Weight changes, if any: 8-10 lb weight loss, placed referral to [facility]   Bowel/Bladder complaints, if any: No  Nausea / Vomiting, if any: yes, now drank soda and 5 minutes later vomited,   Pain issues, if any: sacral right low back pain, herniated discs,   Any blood per rectum:  NO  SAFETY ISSUES: NO  Prior radiation? NO  Pacemaker/ICD? NO  Is the patient on methotrexate?  NO  Current Complaints/Details: Married,  BP (!) 144/97   Pulse 94   Temp 98.1 F (36.7 C) (Oral)   Resp 20   Ht '5\' 8"'$  (1.727 m)   Wt 162 lb 9.6 oz  (73.8 kg)   SpO2 94%   BMI 24.72 kg/m   Wt Readings from Last 3 Encounters:  12/28/16 162 lb 9.6 oz (73.8 kg)  12/28/16 161 lb 1.6 oz (73.1 kg)  12/23/16 163 lb 11.2 oz (74.3 kg)   Ct Sim today

## 2016-12-26 NOTE — Telephone Encounter (Signed)
Spoke to patient regarding upcoming November appointment updates per 11/26 sch message

## 2016-12-26 NOTE — Progress Notes (Signed)
Pt's Ms Contin was rejected by insurance.  Gave denial to the nurse.

## 2016-12-26 NOTE — Progress Notes (Signed)
Submitted auth request for Oxycodone today.  Status is pending.  °

## 2016-12-27 ENCOUNTER — Encounter: Payer: Self-pay | Admitting: Oncology

## 2016-12-27 ENCOUNTER — Ambulatory Visit (HOSPITAL_COMMUNITY)
Admission: RE | Admit: 2016-12-27 | Discharge: 2016-12-27 | Disposition: A | Discharge status: Home / Self Care | Payer: No Typology Code available for payment source | Source: Ambulatory Visit | Attending: Radiation Oncology | Admitting: Radiation Oncology

## 2016-12-27 ENCOUNTER — Encounter: Payer: Self-pay | Admitting: *Deleted

## 2016-12-27 ENCOUNTER — Other Ambulatory Visit: Payer: PRIVATE HEALTH INSURANCE

## 2016-12-27 DIAGNOSIS — K2289 Other specified disease of esophagus: Secondary | ICD-10-CM

## 2016-12-27 DIAGNOSIS — K228 Other specified diseases of esophagus: Secondary | ICD-10-CM

## 2016-12-27 DIAGNOSIS — R918 Other nonspecific abnormal finding of lung field: Secondary | ICD-10-CM | POA: Insufficient documentation

## 2016-12-27 DIAGNOSIS — C7951 Secondary malignant neoplasm of bone: Secondary | ICD-10-CM | POA: Insufficient documentation

## 2016-12-27 DIAGNOSIS — M79604 Pain in right leg: Secondary | ICD-10-CM | POA: Insufficient documentation

## 2016-12-27 DIAGNOSIS — C772 Secondary and unspecified malignant neoplasm of intra-abdominal lymph nodes: Secondary | ICD-10-CM | POA: Diagnosis not present

## 2016-12-27 DIAGNOSIS — C787 Secondary malignant neoplasm of liver and intrahepatic bile duct: Secondary | ICD-10-CM | POA: Diagnosis not present

## 2016-12-27 DIAGNOSIS — K229 Disease of esophagus, unspecified: Secondary | ICD-10-CM | POA: Diagnosis not present

## 2016-12-27 LAB — GLUCOSE, CAPILLARY: Glucose-Capillary: 110 mg/dL — ABNORMAL HIGH (ref 65–99)

## 2016-12-27 MED ORDER — FLUDEOXYGLUCOSE F - 18 (FDG) INJECTION
8.0700 | Freq: Once | INTRAVENOUS | Status: AC | PRN
  Administered 2016-12-27: 8.07 via INTRAVENOUS

## 2016-12-27 NOTE — Progress Notes (Signed)
Interestingly he does have a pet positive lesion without ct correlate in the right iliac bone

## 2016-12-27 NOTE — Progress Notes (Signed)
Pt's Oxycodone was approved through 12/26/17.

## 2016-12-28 ENCOUNTER — Ambulatory Visit
Admission: RE | Admit: 2016-12-28 | Discharge: 2016-12-28 | Disposition: A | Discharge status: Home / Self Care | Payer: PRIVATE HEALTH INSURANCE | Source: Ambulatory Visit | Attending: Radiation Oncology | Admitting: Radiation Oncology

## 2016-12-28 ENCOUNTER — Ambulatory Visit (HOSPITAL_BASED_OUTPATIENT_CLINIC_OR_DEPARTMENT_OTHER): Payer: PRIVATE HEALTH INSURANCE | Admitting: Nurse Practitioner

## 2016-12-28 ENCOUNTER — Telehealth: Payer: Self-pay | Admitting: Nurse Practitioner

## 2016-12-28 ENCOUNTER — Ambulatory Visit
Admission: RE | Admit: 2016-12-28 | Payer: PRIVATE HEALTH INSURANCE | Source: Ambulatory Visit | Admitting: Radiation Oncology

## 2016-12-28 ENCOUNTER — Encounter: Payer: Self-pay | Admitting: Radiation Oncology

## 2016-12-28 ENCOUNTER — Telehealth: Payer: Self-pay | Admitting: Oncology

## 2016-12-28 ENCOUNTER — Ambulatory Visit (HOSPITAL_BASED_OUTPATIENT_CLINIC_OR_DEPARTMENT_OTHER): Payer: PRIVATE HEALTH INSURANCE

## 2016-12-28 VITALS — BP 112/72 | HR 88 | Temp 97.9°F | Resp 18 | Ht 68.0 in | Wt 161.1 lb

## 2016-12-28 DIAGNOSIS — Z882 Allergy status to sulfonamides status: Secondary | ICD-10-CM | POA: Diagnosis not present

## 2016-12-28 DIAGNOSIS — M069 Rheumatoid arthritis, unspecified: Secondary | ICD-10-CM | POA: Insufficient documentation

## 2016-12-28 DIAGNOSIS — J90 Pleural effusion, not elsewhere classified: Secondary | ICD-10-CM

## 2016-12-28 DIAGNOSIS — C155 Malignant neoplasm of lower third of esophagus: Secondary | ICD-10-CM

## 2016-12-28 DIAGNOSIS — Z79899 Other long term (current) drug therapy: Secondary | ICD-10-CM | POA: Insufficient documentation

## 2016-12-28 DIAGNOSIS — C787 Secondary malignant neoplasm of liver and intrahepatic bile duct: Secondary | ICD-10-CM | POA: Diagnosis not present

## 2016-12-28 DIAGNOSIS — Z823 Family history of stroke: Secondary | ICD-10-CM | POA: Insufficient documentation

## 2016-12-28 DIAGNOSIS — Z808 Family history of malignant neoplasm of other organs or systems: Secondary | ICD-10-CM | POA: Diagnosis not present

## 2016-12-28 DIAGNOSIS — C772 Secondary and unspecified malignant neoplasm of intra-abdominal lymph nodes: Secondary | ICD-10-CM | POA: Diagnosis not present

## 2016-12-28 DIAGNOSIS — Z9049 Acquired absence of other specified parts of digestive tract: Secondary | ICD-10-CM | POA: Insufficient documentation

## 2016-12-28 DIAGNOSIS — Z8059 Family history of malignant neoplasm of other urinary tract organ: Secondary | ICD-10-CM | POA: Insufficient documentation

## 2016-12-28 DIAGNOSIS — Z9889 Other specified postprocedural states: Secondary | ICD-10-CM | POA: Insufficient documentation

## 2016-12-28 DIAGNOSIS — Z91013 Allergy to seafood: Secondary | ICD-10-CM | POA: Insufficient documentation

## 2016-12-28 DIAGNOSIS — Z7952 Long term (current) use of systemic steroids: Secondary | ICD-10-CM | POA: Diagnosis not present

## 2016-12-28 DIAGNOSIS — R599 Enlarged lymph nodes, unspecified: Secondary | ICD-10-CM | POA: Insufficient documentation

## 2016-12-28 DIAGNOSIS — Z8043 Family history of malignant neoplasm of testis: Secondary | ICD-10-CM | POA: Diagnosis not present

## 2016-12-28 DIAGNOSIS — Z79891 Long term (current) use of opiate analgesic: Secondary | ICD-10-CM | POA: Diagnosis not present

## 2016-12-28 DIAGNOSIS — Z87891 Personal history of nicotine dependence: Secondary | ICD-10-CM | POA: Insufficient documentation

## 2016-12-28 DIAGNOSIS — Z888 Allergy status to other drugs, medicaments and biological substances status: Secondary | ICD-10-CM | POA: Diagnosis not present

## 2016-12-28 LAB — COMPREHENSIVE METABOLIC PANEL
ALT: 74 U/L — ABNORMAL HIGH (ref 0–55)
ANION GAP: 11 meq/L (ref 3–11)
AST: 51 U/L — ABNORMAL HIGH (ref 5–34)
Albumin: 2.8 g/dL — ABNORMAL LOW (ref 3.5–5.0)
Alkaline Phosphatase: 291 U/L — ABNORMAL HIGH (ref 40–150)
BUN: 31.8 mg/dL — ABNORMAL HIGH (ref 7.0–26.0)
CALCIUM: 9.2 mg/dL (ref 8.4–10.4)
CHLORIDE: 102 meq/L (ref 98–109)
CO2: 24 mEq/L (ref 22–29)
CREATININE: 0.9 mg/dL (ref 0.7–1.3)
Glucose: 94 mg/dl (ref 70–140)
POTASSIUM: 4.3 meq/L (ref 3.5–5.1)
Sodium: 137 mEq/L (ref 136–145)
Total Bilirubin: 0.9 mg/dL (ref 0.20–1.20)
Total Protein: 6.3 g/dL — ABNORMAL LOW (ref 6.4–8.3)

## 2016-12-28 LAB — CBC WITH DIFFERENTIAL/PLATELET
BASO%: 0 % (ref 0.0–2.0)
Basophils Absolute: 0 10*3/uL (ref 0.0–0.1)
EOS ABS: 0 10*3/uL (ref 0.0–0.5)
EOS%: 0 % (ref 0.0–7.0)
HCT: 37.3 % — ABNORMAL LOW (ref 38.4–49.9)
HEMOGLOBIN: 12 g/dL — AB (ref 13.0–17.1)
LYMPH%: 7.4 % — ABNORMAL LOW (ref 14.0–49.0)
MCH: 28 pg (ref 27.2–33.4)
MCHC: 32.2 g/dL (ref 32.0–36.0)
MCV: 87.1 fL (ref 79.3–98.0)
MONO#: 0.4 10*3/uL (ref 0.1–0.9)
MONO%: 2 % (ref 0.0–14.0)
NEUT%: 90.6 % — ABNORMAL HIGH (ref 39.0–75.0)
NEUTROS ABS: 19.8 10*3/uL — AB (ref 1.5–6.5)
Platelets: 295 10*3/uL (ref 140–400)
RBC: 4.28 10*6/uL (ref 4.20–5.82)
RDW: 17.3 % — AB (ref 11.0–14.6)
WBC: 21.8 10*3/uL — AB (ref 4.0–10.3)
lymph#: 1.6 10*3/uL (ref 0.9–3.3)

## 2016-12-28 LAB — CEA (IN HOUSE-CHCC)

## 2016-12-28 MED ORDER — DEXAMETHASONE 4 MG PO TABS: 4.0000 mg | ORAL_TABLET | Freq: Every day | ORAL | 0 refills | Status: DC

## 2016-12-28 NOTE — Telephone Encounter (Signed)
Called [facility] to see if patient can be treated at [facility] had to leave a message

## 2016-12-28 NOTE — Progress Notes (Signed)
Please see the Nurse Progress Note in the MD Initial Consult Encounter for this patient. 

## 2016-12-28 NOTE — Telephone Encounter (Signed)
Gave avs and calendar for December  °

## 2016-12-28 NOTE — Progress Notes (Addendum)
[facility] OFFICE PROGRESS NOTE   Diagnosis: Esophagus cancer  INTERVAL HISTORY:   Mr. Ahr returns as scheduled.  Back and leg pain are better.  He continues MS Contin with oxycodone as needed.  He continues to note right leg weakness.  He continues to have dysphagia.  He is having some difficulty with liquids.  Objective:  Vital signs in last 24 hours:  Blood pressure 112/72, pulse 88, temperature 97.9 F (36.6 C), temperature source Oral, resp. rate 18, height 5\' 8"  (1.727 m), weight 161 lb 1.6 oz (73.1 kg), SpO2 92 %.    HEENT: No thrush or ulcers. Resp: Lungs clear bilaterally. Cardio: Regular rate and rhythm. GI: Abdomen soft and nontender.  No hepatomegaly. Vascular: Trace lower leg edema bilaterally.  Calf soft and nontender. Neuro: Proximal right leg weakness.  Unable to elicit right knee DTR.   Lab Results:  No results found for: WBC, HGB, HCT, MCV, PLT, NEUTROABS  Imaging:  Nm Pet Image Initial (pi) Skull Base To Thigh  Result Date: 12/27/2016 CLINICAL DATA:  Initial treatment strategy for esophageal carcinoma. Stage IV carcinoma suspected. RIGHT flank pain and leg weakness. EXAM: NUCLEAR MEDICINE PET SKULL BASE TO THIGH TECHNIQUE: 8.1 mCi F-18 FDG was injected intravenously. Full-ring PET imaging was performed from the skull base to thigh after the radiotracer. CT data was obtained and used for attenuation correction and anatomic localization. FASTING BLOOD GLUCOSE:  Value: 110 mg/dl COMPARISON:  CT 12/16/2016, MRI 12/20/2016 FINDINGS: NECK Hypermetabolic RIGHT supraclavicular lymph node SUV max equals 17.1. Node measures 16 mm just beneath the sternocleidomastoid muscle (image 42, series 4). CHEST Bulky hypermetabolic mediastinal lymph nodes. Lymph nodes in the paratracheal and subcarinal nodal stations. Example lymph node includes 30 mm short axis RIGHT lower paratracheal lymph node with SUV max equal 99.3 Hypermetabolic RIGHT hilar lymph node  additionally. A chain of hypermetabolic lymph nodes extend in the RIGHT infraclavicular nodal station along the clavicle. Hypermetabolic LEFT infraclavicular node additionally. There is a fine nodular pattern in the RIGHT upper lobe (image 75, series 4) with mild associated metabolic activity. No discrete hypermetabolic pulmonary nodules. There is hypermetabolic activity extending superiorly from the RIGHT hilum along the vasculature with SUV max equal 5.5. Additional hypermetabolic subcutaneous nodules in the thorax. One nodule along the lateral margin of the RIGHT pectoralis muscle in the RIGHT axilla. Similar nodule superficial to the LEFT shoulder musculature at the level the scapula with SUV max equals 5.6 (image 61). ABDOMEN/PELVIS Intensely hypermetabolic mass in distal esophagus at the gastroesophageal junction with focal wall thickening (SUV max equal 15.3). There are multiple enlarged hypermetabolic gastrohepatic ligament lymph nodes and bulky periportal lymph nodes. Periportal lymph node with SUV max equal 18.9. Three intensely hypermetabolic metastatic lesions in the RIGHT hepatic lobe. The largest measures 5.4 cm with SUV max equal 18.6. Bulky Hypermetabolic adenopathy extends in the retroperitoneum along the aorta and IVC. Hypermetabolic adenopathy extends superiorly to the retrocrural lymph nodes. Hypermetabolic adenopathy extends inferiorly to the level of the LEFT iliac vessels. There is hypermetabolic nodal metastasis within the mesenteries of the RIGHT small bowel with SUV max equal 14.5 SKELETON Hypermetabolic lesion in the LEFT sacral ala with SUV max equal 8.2. Subtle lesion on the CT portion exam measures 9 mm (image 159, series 4). Hypermetabolic lesion in the RIGHT iliac wing without clear CT lesion. Hypermetabolic lesion at the inferior tip of the LEFT scapula. Focal muscular metastasis in the LEFT gluteal musculature with SUV max equal 13. Soft tissue metastasis  adjacent to the posterior  LEFT sixth rib. Additional soft tissue retroperitoneal metastasis along the LEFT psoas muscle. No hypermetabolic lesion in the lumbar spine or spinal canal to explain patient's RIGHT -sided leg pain weakness. IMPRESSION: 1. Hypermetabolic mass at the gastroesophageal junction consistent esophageal carcinoma. Unfortunately, evidence of widespread metastatic disease to multiple organ systems. 2. Extensive hypermetabolic metastatic adenopathy involving the RIGHT supraclavicular nodal station, mediastinal lymph nodes, retrocrural node lymph nodes, periaortic lymph nodes, LEFT iliac lymph nodes, and central mesenteric lymph nodes. The RIGHT supraclavicular lymph node would be assessable to biopsy. 3. Fine nodularity in the RIGHT upper lobe with hypermetabolic thickening in the RIGHT suprahilar peribronchial structures. Differential would include a pulmonary infection or inflammation versus lymphangitic spread of carcinoma. 4. Hypermetabolic metastasis to the RIGHT hepatic lobe. 5. Several small skeletal metastasis including RIGHT iliac bone, LEFT sacrum, and LEFT scapula. 6. Multiple unusual soft tissue metastasis to the subcutaneous tissues adjacent to the LEFT scapular musculature, retroperitoneal nodal metastasis along the LEFT psoas muscle and metastatic lesion within the LEFT gluteal musculature. 7. No lesion in the lumbar spine or spinal canal to explain patient's acute RIGHT-sided leg pain. Electronically Signed   By: Suzy Bouchard M.D.   On: 12/27/2016 09:44    Medications: I have reviewed the patient's current medications.  Assessment/Plan: 1. Metastatic esophagus cancer ? Distal esophagus mass at upper endoscopy 12/15/2016, biopsy confirmed invasive poorly differentiated adenocarcinoma ? CTs of 02/16/2016-distal esophagus thickening, mediastinal/right hilar lymphadenopathy, abdominal/retroperitoneal lymphadenopathy, liver metastases ? MRI lumbar spine 12/20/2016-7 mm S1 lesion suspicious for  metastasis, bulky retroperitoneal and prevertebral malignant lymphadenopathy, left psoas muscle metastasis, degenerative disease causing severe neural foraminal stenosis at right L4 and left L5 ? PET scan 12/27/2016-hypermetabolic mass at the gastroesophageal junction; extensive hypermetabolic adenopathy involving the right supraclavicular nodal station, mediastinal lymph nodes, retrocrural lymph nodes, periaortic lymph nodes, left iliac lymph nodes and central mesenteric lymph nodes; fine nodularity in the right upper lobe with hypermetabolic thickening in the right suprahilar peribronchial structures; hypermetabolic metastasis to the right hepatic lobe; several small skeletal metastasis including right iliac bone, left sacrum and left scapula; multiple soft tissue metastases to the subcutaneous tissues adjacent to the left scapular musculature, retroperitoneal nodal metastasis along the left psoas muscle and metastatic lesion within the left gluteal musculature.  No lesion in the lumbar spine or spinal canal to explain the patient's acute right-sided leg pain. 2. Pain possibly due to disc herniation- the pain may be related to retroperitoneal lymphadenopathy or bone metastases; plain x-ray right hip 12/21/2016 with no blastic or lytic bone lesions.  No fracture or dislocation.  Review of the lumbar spine MRI shows right L4 disc herniation which could be responsible for the right leg pain and weakness.  Trial of dexamethasone initiated with improvement.  3.Rheumatoid arthritis  4.Dysphagia/weight loss secondary to #1  5.Chronic left pleural effusion   Disposition: Mr. Frasier appears improved.  The right leg pain has improved considerably with dexamethasone.  He and his wife understand the pain is likely related to acute disc herniation rather than cancer.  He will continue dexamethasone at a dose of 4 mg daily.  He will continue MS Contin with oxycodone as needed.  Dr. Benay Spice reviewed the PET  scan report/images with Mr. Gavigan and his wife at today's visit.  They understand there is evidence of extensive metastatic disease.  He is scheduled for a biopsy and Port-A-Cath placement next week.  The plan is to begin systemic therapy on the FOLFOX regimen on  01/05/2017.  We reviewed potential toxicities associated with chemotherapy including bone marrow toxicity, nausea, mouth sores, allergic reaction.  We discussed potential toxicities associated with 5 fluorouracil including mouth sores, diarrhea, hand-foot syndrome, skin hyperpigmentation, increased sensitivity to sun. We discussed the neurotoxicity associated with Oxaliplatin.  He agrees to proceed.  He has attended the chemotherapy education class.  He will return to the lab today for baseline labs.  He will be seen in follow-up on 01/05/2017 prior to proceeding with chemotherapy.  He will contact the office in the interim with any problems.  Patient seen with Dr. Benay Spice.  25 minutes were spent face-to-face at today's visit with the majority of that time involved in counseling/coordination of care.    Ned Card ANP/GNP-BC   12/28/2016  11:01 AM  This was a shared visit with Ned Card.  We reviewed the PET images with Mr. Lech and his wife.  He appears to have extensive metastatic esophagus cancer involving chest/abdominal lymph nodes, the liver, and bones.  There is no PET correlate to explain his pain.  The pain has improved significantly with Decadron and may be related to a lumbar disc herniation.  We will taper the Decadron dose.  He will be referred for a diagnostic biopsy of the liver or a metastatic lymph node to confirm metastatic disease and obtain additional tissue for molecular testing.  I recommend beginning FOLFOX chemotherapy when a Port-A-Cath is in place.  We reviewed the potential toxicities associated with the FOLFOX regimen.  He will attended chemotherapy teaching class.  He agrees to proceed.  Julieanne Manson,  MD

## 2016-12-29 NOTE — Progress Notes (Signed)
Radiation Oncology         [phone] ________________________________  Name: Grant Todd        [name]: [id number]  Date of Service: 12/28/2016 DOB: [birth year]  [name], [name], MD  Grace Isaac, MD     REFERRING PHYSICIAN: Grace Isaac, MD   DIAGNOSIS: The encounter diagnosis was Cancer of distal third of esophagus ([place]).   HISTORY OF PRESENT ILLNESS: Grant Todd is a 61 y.o. male seen at the request of Dr. Benay Spice for a new diagnosis of metastatic esophageal cancer. The patient reports fatigue for the last year without additional symptoms until about 2 months ago when he had an episode of dysphagia to solid food. He had another episode of this about two weeks ago and proceeded with evaluation with Dr. Jenetta Loges and EGD on 12/15/16 which revealed a mass in the lower 3rd of the esophagus 40-45 cm from the incisors. A biopsy confirmed invasive adenocarcinoma, and he proceeded with CT imaging on 12/16/16 which revealed extensive adenopathy in the chest, possible lymphangitic changes in bilateral lungs, at least 2 liver lesions, and abdominal and retroperitoneal adenopathy. MRI of the lumbar spine revealed a 7 mm lesion at S1 and bulky nodes in the retroperitoneal space, and a 17 mm lesion in the left psoas. He met with Dr. Benay Spice and discussed systemic chemotherapy and discontinuing immunomodulators for his RA. He also had plain films of his right hip because of pain which did not show any concerns for disease. A PET scan on 12/27/16 revealed hypermetabolism at the sites seen on CT with a possible findings of hypermetabolism in the right iliac wing, and left iliac wing, as well as throughout the spine. He comes today to discuss options of palliative radiotherapy.   PREVIOUS RADIATION THERAPY: No   PAST MEDICAL HISTORY:  Past Medical History:  Diagnosis Date  . Elevated LFTs   . Esophageal mass   . Pleural effusion on left   . Posterior tibial tendon dysfunction, right   .  Rheumatoid arthritis ([place])   . Seasonal allergic rhinitis   . Severe esophageal dysplasia   . Tobacco use        PAST SURGICAL HISTORY: Past Surgical History:  Procedure Laterality Date  . CHOLECYSTECTOMY    . TONSILLECTOMY       FAMILY HISTORY:  Family History  Problem Relation Age of Onset  . Uterine cancer Mother   . Hyperlipidemia Father   . CVA Father   . Head & neck cancer Father   . Testicular cancer Son      SOCIAL HISTORY:  reports that he quit smoking about 15 years ago. His smoking use included cigarettes. he has never used smokeless tobacco. He reports that he drinks alcohol. He reports that he does not use drugs.   ALLERGIES: Chantix [varenicline]; Methotrexate derivatives; Shellfish allergy; and Sulfonamide derivatives   MEDICATIONS:  Current Outpatient Medications  Medication Sig Dispense Refill  . acetaminophen (TYLENOL 8 HOUR) 650 MG CR tablet Take 1,300 mg by mouth every 8 (eight) hours as needed for pain.    Marland Kitchen CARAFATE 1 GM/10ML suspension Take 1 g by mouth 2 (two) times daily.     . cyclobenzaprine (FLEXERIL) 5 MG tablet Take 1 tablet (5 mg total) by mouth 3 (three) times daily as needed for muscle spasms. 50 tablet 0  . dexamethasone (DECADRON) 4 MG tablet Take 1 tablet (4 mg total) by mouth daily. 15 tablet 0  . HYDROcodone-acetaminophen (NORCO/VICODIN) 5-325 MG tablet  Take 1 tablet by mouth every 6 (six) hours as needed for moderate pain.    Marland Kitchen morphine (MS CONTIN) 30 MG 12 hr tablet Take 1 tablet (30 mg total) by mouth every 12 (twelve) hours. 60 tablet 0  . oxyCODONE (OXY IR/ROXICODONE) 5 MG immediate release tablet Take 1 tablet (5 mg total) by mouth every 4 (four) hours as needed for severe pain (Take 1-2 Tablets by mouth very 4 hours as needed for severe pain). 75 tablet 0  . Multiple Vitamins-Minerals (MULTIVITAMIN ADULT PO) Take 1 tablet by mouth daily.    . Omega-3 Fatty Acids (FISH OIL) 1200 MG CAPS Take 2 capsules by mouth 2 (two) times  daily.    . predniSONE (DELTASONE) 5 MG tablet Take 1 tablet by mouth daily.     No current facility-administered medications for this encounter.      REVIEW OF SYSTEMS: On review of systems, the patient reports that he is doing fair. He has lost about 8 pounds, and is still eating solid food but at times met with resistance when swallowing. He reports constant soreness in his throat. He also reports right hip and low back pain that is improved with MSContin and Oxycodone. He denies any chest pain, shortness of breath, cough, fevers, chills, night sweats. He denies any bladder disturbances, and denies abdominal pain. If he eats foods that don't agree with him, he regurgitates. He is hoping to avoid a PEG tube, and reports he's been having some difficulty with constipation. He denies any new musculoskeletal or joint aches or pains. A complete review of systems is obtained and is otherwise negative.     PHYSICAL EXAM:  Wt Readings from Last 3 Encounters:  12/28/16 162 lb 9.6 oz (73.8 kg)  12/28/16 161 lb 1.6 oz (73.1 kg)  12/23/16 163 lb 11.2 oz (74.3 kg)   Temp Readings from Last 3 Encounters:  12/28/16 98.1 F (36.7 C) (Oral)  12/28/16 97.9 F (36.6 C) (Oral)  12/23/16 98.1 F (36.7 C) (Oral)   BP Readings from Last 3 Encounters:  12/28/16 (!) 144/97  12/28/16 112/72  12/23/16 103/61   Pulse Readings from Last 3 Encounters:  12/28/16 94  12/28/16 88  12/23/16 (!) 115   Pain Assessment Pain Score: 0-No pain/10  In general this is a tired and thin appearing  Caucasian male in no acute distress. He is alert and oriented x4 and appropriate throughout the examination. Cardiopulmonary assessment is negative for acute distress and he exhibits normal effort.     ECOG = 1  0 - Asymptomatic (Fully active, able to carry on all predisease activities without restriction)  1 - Symptomatic but completely ambulatory (Restricted in physically strenuous activity but ambulatory and able to  carry out work of a light or sedentary nature. For example, light housework, office work)  2 - Symptomatic, <50% in bed during the day (Ambulatory and capable of all self care but unable to carry out any work activities. Up and about more than 50% of waking hours)  3 - Symptomatic, >50% in bed, but not bedbound (Capable of only limited self-care, confined to bed or chair 50% or more of waking hours)  4 - Bedbound (Completely disabled. Cannot carry on any self-care. Totally confined to bed or chair)  5 - Death   Eustace Pen MM, Creech RH, Tormey DC, et al. [phone]). "Toxicity and response criteria of the [facility]". [place] Oncol. 5 (6): 649-55    LABORATORY DATA:  Lab  Results  Component Value Date   WBC 21.8 (H) 12/28/2016   HGB 12.0 (L) 12/28/2016   HCT 37.3 (L) 12/28/2016   MCV 87.1 12/28/2016   PLT 295 12/28/2016   Lab Results  Component Value Date   NA 137 12/28/2016   K 4.3 12/28/2016   CO2 24 12/28/2016   Lab Results  Component Value Date   ALT 74 (H) 12/28/2016   AST 51 (H) 12/28/2016   ALKPHOS 291 (H) 12/28/2016   BILITOT 0.90 12/28/2016      RADIOGRAPHY: Ct Chest W Contrast  Result Date: 12/16/2016 CLINICAL DATA:  61 year old male with esophageal mass identified on endoscopy yesterday. Recent weight loss. EXAM: CT CHEST, ABDOMEN, AND PELVIS WITH CONTRAST TECHNIQUE: Multidetector CT imaging of the chest, abdomen and pelvis was performed following the standard protocol during bolus administration of intravenous contrast. CONTRAST:  130m ISOVUE-300 IOPAMIDOL (ISOVUE-300) INJECTION 61% COMPARISON:  08/13/2015 and prior CTs. FINDINGS: CT CHEST FINDINGS Cardiovascular: Upper limits normal heart size noted. Mild coronary artery calcifications identified. No evidence of thoracic aortic aneurysm or pericardial effusion. Mediastinum/Nodes: Circumferential wall thickening of the distal esophagus is noted. Multiple large lymph nodes are identified  throughout the mediastinum and right hilum with index nodes as follows (series 3): A 3.5 cm right peritracheal node (image 24) 83 cm right hilar node (image 31) A 3.5 cm subcarinal node (image 33). Lungs/Pleura: Mild airspace/ground-glass opacity within the inferior right lower lobe likely represents a small area of infection or possibly aspiration. Mild bibasilar atelectasis/ scarring noted. A small loculated left pleural effusion and associated mild left atelectasis is unchanged. Centrilobular emphysema is again noted. Musculoskeletal: The no acute bony abnormalities or suspicious bony lesions identified. CT ABDOMEN PELVIS FINDINGS Hepatobiliary: A 3.5 x 4.5 cm inferior hepatic mass (junction of the right and left hepatic lobes, image 71 series 3) and a 1.5 cm inferior right hepatic mass (image 76, series 3) likely represent metastases. Remainder of the liver is unremarkable. The patient is status post cholecystectomy. No biliary dilatation. Pancreas: Unremarkable Spleen: Unremarkable Adrenals/Urinary Tract: The kidneys, adrenal glands and bladder are unremarkable. Stomach/Bowel: There is no evidence of bowel obstruction. Colonic diverticulosis noted without evidence of diverticulitis. Distal esophageal wall thickening is again noted without other bowel wall thickening identified. The appendix is normal. Vascular/Lymphatic: Multiple enlarged lymph nodes in the upper abdomen, retroperitoneum and mesenteric noted with index nodes as follows (series 3): A 3.1 x 5.5 cm portal caval nodal mass (image 63) A 2.5 cm left periaortic node (image 71) A 2.8 cm mesenteric node (image 87) Aortic atherosclerotic calcifications are noted without aneurysm. Reproductive: Prostate enlargement noted. Other: No ascites, pneumoperitoneum or abscess. Small bilateral inguinal hernias containing fat are present. Musculoskeletal: No acute bony abnormalities or suspicious bony lesions identified. Mild degenerative changes in the lumbar  spine are present. IMPRESSION: 1. Diffuse circumferential wall thickening of the distal esophagus likely representing the mass visualized on recent endoscopy and highly suspicious for malignancy. 2. Extensive chest and abdominal lymphadenopathy with 2 hepatic masses, compatible with metastatic disease. 3. Small area of airspace disease/ground-glass opacity within the right upper lobe, likely representing aspiration or pneumonia. 4. Aortic Atherosclerosis (ICD10-I70.0) and Emphysema (ICD10-J43.9). These results will be called to the ordering clinician or representative by the Radiologist Assistant, and communication documented in the PACS or zVision Dashboard. Electronically Signed   By: JMargarette CanadaM.D.   On: 12/16/2016 14:14   Mr Lumbar Spine W Wo Contrast  Result Date: 12/20/2016 CLINICAL DATA:  61year old male recently  diagnosed with esophageal cancer. Severe low back pain greater on the right and radiating to the right leg with weakness for 2 months. No known injury. EXAM: MRI LUMBAR SPINE WITHOUT AND WITH CONTRAST TECHNIQUE: Multiplanar and multiecho pulse sequences of the lumbar spine were obtained without and with intravenous contrast. CONTRAST:  2m MULTIHANCE GADOBENATE DIMEGLUMINE 529 MG/ML IV SOLN COMPARISON:  CT chest abdomen and pelvis 12/16/2016 FINDINGS: Segmentation:  Normal as demonstrated on the recent CT. Alignment: Stable vertebral height and alignment. Relatively maintained lumbar lordosis except for mild grade 1 anterolisthesis of L4 on L5 and trace retrolisthesis of L5 on S1. Vertebrae: In the central S1 vertebral body there is a small 6-7 mm round focus of decreased T1/T2 signal with enhancement and increased STIR signal. This was not evident on the recent CT. No similar bone lesion is identified elsewhere. However, there is bulky malignant retroperitoneal and prevertebral lymphadenopathy throughout the lower thoracic and lumbar spine. There is a superimposed 17 mm spiculated  metastasis along the posterior margin of the left psoas muscle at the L5 level (series 15, image 18 and series 18, image 35. Nearby a left lateral endplate marrow edema at L5-S1 is favored to be degenerative in nature. There is similar degenerative appearing right lateral endplate marrow edema at L4-L5. Conus medullaris and cauda equina: Conus extends to the L1 level. No abnormal intradural enhancement. No nerve root thickening. Conus and cauda equina appear normal. Paraspinal and other soft tissues: Bulky abdominal and pelvic retroperitoneal/ prevertebral lymphadenopathy as stated earlier. Small left lower so uKoreamuscle metastasis. No other paraspinal muscle metastasis identified. Disc levels: Lumbar spine degeneration is most pronounced: L4-L5: Mild anterolisthesis. Right eccentric circumferential disc bulge with endplate spurring and broad-based posterior component. Moderate facet and ligament flavum hypertrophy with trace left facet joint fluid. Multifactorial moderate to severe right L4 neural foraminal stenosis and mild right lateral recess stenosis. No significant spinal stenosis. L5-S1: Disc space loss with left eccentric circumferential disc bulge and endplate spurring. Mild facet hypertrophy. Moderate to severe left L5 neural foraminal stenosis. IMPRESSION: 1. Small 7 mm enhancing lesion in the central S1 vertebral body is nonspecific but suspicious for early osseous metastatic disease in this clinical setting. No other spinal or bone metastasis metastasis identified. 2. Bulky retroperitoneal and prevertebral malignant lymphadenopathy throughout the lower thoracic and lumbar spine. 3. Small posterior left psoas muscle metastasis measuring 17 mm at the left L5 level. No other paraspinal muscle metastasis identified. 4. L4-L5 and L5-S1 lumbar spine degeneration with mild spondylolisthesis, severe right L4 and left L5 neural foraminal stenosis, but no significant spinal stenosis. Electronically Signed   By: HGenevie AnnM.D.   On: 12/20/2016 16:36   Ct Abdomen Pelvis W Contrast  Result Date: 12/16/2016 CLINICAL DATA:  61year old male with esophageal mass identified on endoscopy yesterday. Recent weight loss. EXAM: CT CHEST, ABDOMEN, AND PELVIS WITH CONTRAST TECHNIQUE: Multidetector CT imaging of the chest, abdomen and pelvis was performed following the standard protocol during bolus administration of intravenous contrast. CONTRAST:  1043mISOVUE-300 IOPAMIDOL (ISOVUE-300) INJECTION 61% COMPARISON:  08/13/2015 and prior CTs. FINDINGS: CT CHEST FINDINGS Cardiovascular: Upper limits normal heart size noted. Mild coronary artery calcifications identified. No evidence of thoracic aortic aneurysm or pericardial effusion. Mediastinum/Nodes: Circumferential wall thickening of the distal esophagus is noted. Multiple large lymph nodes are identified throughout the mediastinum and right hilum with index nodes as follows (series 3): A 3.5 cm right peritracheal node (image 24) 83 cm right hilar node (image 31) A  3.5 cm subcarinal node (image 33). Lungs/Pleura: Mild airspace/ground-glass opacity within the inferior right lower lobe likely represents a small area of infection or possibly aspiration. Mild bibasilar atelectasis/ scarring noted. A small loculated left pleural effusion and associated mild left atelectasis is unchanged. Centrilobular emphysema is again noted. Musculoskeletal: The no acute bony abnormalities or suspicious bony lesions identified. CT ABDOMEN PELVIS FINDINGS Hepatobiliary: A 3.5 x 4.5 cm inferior hepatic mass (junction of the right and left hepatic lobes, image 71 series 3) and a 1.5 cm inferior right hepatic mass (image 76, series 3) likely represent metastases. Remainder of the liver is unremarkable. The patient is status post cholecystectomy. No biliary dilatation. Pancreas: Unremarkable Spleen: Unremarkable Adrenals/Urinary Tract: The kidneys, adrenal glands and bladder are unremarkable. Stomach/Bowel:  There is no evidence of bowel obstruction. Colonic diverticulosis noted without evidence of diverticulitis. Distal esophageal wall thickening is again noted without other bowel wall thickening identified. The appendix is normal. Vascular/Lymphatic: Multiple enlarged lymph nodes in the upper abdomen, retroperitoneum and mesenteric noted with index nodes as follows (series 3): A 3.1 x 5.5 cm portal caval nodal mass (image 63) A 2.5 cm left periaortic node (image 71) A 2.8 cm mesenteric node (image 87) Aortic atherosclerotic calcifications are noted without aneurysm. Reproductive: Prostate enlargement noted. Other: No ascites, pneumoperitoneum or abscess. Small bilateral inguinal hernias containing fat are present. Musculoskeletal: No acute bony abnormalities or suspicious bony lesions identified. Mild degenerative changes in the lumbar spine are present. IMPRESSION: 1. Diffuse circumferential wall thickening of the distal esophagus likely representing the mass visualized on recent endoscopy and highly suspicious for malignancy. 2. Extensive chest and abdominal lymphadenopathy with 2 hepatic masses, compatible with metastatic disease. 3. Small area of airspace disease/ground-glass opacity within the right upper lobe, likely representing aspiration or pneumonia. 4. Aortic Atherosclerosis (ICD10-I70.0) and Emphysema (ICD10-J43.9). These results will be called to the ordering clinician or representative by the Radiologist Assistant, and communication documented in the PACS or zVision Dashboard. Electronically Signed   By: Margarette Canada M.D.   On: 12/16/2016 14:14   Nm Pet Image Initial (pi) Skull Base To Thigh  Result Date: 12/27/2016 CLINICAL DATA:  Initial treatment strategy for esophageal carcinoma. Stage IV carcinoma suspected. RIGHT flank pain and leg weakness. EXAM: NUCLEAR MEDICINE PET SKULL BASE TO THIGH TECHNIQUE: 8.1 mCi F-18 FDG was injected intravenously. Full-ring PET imaging was performed from the skull  base to thigh after the radiotracer. CT data was obtained and used for attenuation correction and anatomic localization. FASTING BLOOD GLUCOSE:  Value: 110 mg/dl COMPARISON:  CT 12/16/2016, MRI 12/20/2016 FINDINGS: NECK Hypermetabolic RIGHT supraclavicular lymph node SUV max equals 17.1. Node measures 16 mm just beneath the sternocleidomastoid muscle (image 42, series 4). CHEST Bulky hypermetabolic mediastinal lymph nodes. Lymph nodes in the paratracheal and subcarinal nodal stations. Example lymph node includes 30 mm short axis RIGHT lower paratracheal lymph node with SUV max equal 62.9 Hypermetabolic RIGHT hilar lymph node additionally. A chain of hypermetabolic lymph nodes extend in the RIGHT infraclavicular nodal station along the clavicle. Hypermetabolic LEFT infraclavicular node additionally. There is a fine nodular pattern in the RIGHT upper lobe (image 75, series 4) with mild associated metabolic activity. No discrete hypermetabolic pulmonary nodules. There is hypermetabolic activity extending superiorly from the RIGHT hilum along the vasculature with SUV max equal 5.5. Additional hypermetabolic subcutaneous nodules in the thorax. One nodule along the lateral margin of the RIGHT pectoralis muscle in the RIGHT axilla. Similar nodule superficial to the LEFT shoulder musculature at the  level the scapula with SUV max equals 5.6 (image 61). ABDOMEN/PELVIS Intensely hypermetabolic mass in distal esophagus at the gastroesophageal junction with focal wall thickening (SUV max equal 15.3). There are multiple enlarged hypermetabolic gastrohepatic ligament lymph nodes and bulky periportal lymph nodes. Periportal lymph node with SUV max equal 18.9. Three intensely hypermetabolic metastatic lesions in the RIGHT hepatic lobe. The largest measures 5.4 cm with SUV max equal 18.6. Bulky Hypermetabolic adenopathy extends in the retroperitoneum along the aorta and IVC. Hypermetabolic adenopathy extends superiorly to the  retrocrural lymph nodes. Hypermetabolic adenopathy extends inferiorly to the level of the LEFT iliac vessels. There is hypermetabolic nodal metastasis within the mesenteries of the RIGHT small bowel with SUV max equal 14.5 SKELETON Hypermetabolic lesion in the LEFT sacral ala with SUV max equal 8.2. Subtle lesion on the CT portion exam measures 9 mm (image 159, series 4). Hypermetabolic lesion in the RIGHT iliac wing without clear CT lesion. Hypermetabolic lesion at the inferior tip of the LEFT scapula. Focal muscular metastasis in the LEFT gluteal musculature with SUV max equal 13. Soft tissue metastasis adjacent to the posterior LEFT sixth rib. Additional soft tissue retroperitoneal metastasis along the LEFT psoas muscle. No hypermetabolic lesion in the lumbar spine or spinal canal to explain patient's RIGHT -sided leg pain weakness. IMPRESSION: 1. Hypermetabolic mass at the gastroesophageal junction consistent esophageal carcinoma. Unfortunately, evidence of widespread metastatic disease to multiple organ systems. 2. Extensive hypermetabolic metastatic adenopathy involving the RIGHT supraclavicular nodal station, mediastinal lymph nodes, retrocrural node lymph nodes, periaortic lymph nodes, LEFT iliac lymph nodes, and central mesenteric lymph nodes. The RIGHT supraclavicular lymph node would be assessable to biopsy. 3. Fine nodularity in the RIGHT upper lobe with hypermetabolic thickening in the RIGHT suprahilar peribronchial structures. Differential would include a pulmonary infection or inflammation versus lymphangitic spread of carcinoma. 4. Hypermetabolic metastasis to the RIGHT hepatic lobe. 5. Several small skeletal metastasis including RIGHT iliac bone, LEFT sacrum, and LEFT scapula. 6. Multiple unusual soft tissue metastasis to the subcutaneous tissues adjacent to the LEFT scapular musculature, retroperitoneal nodal metastasis along the LEFT psoas muscle and metastatic lesion within the LEFT gluteal  musculature. 7. No lesion in the lumbar spine or spinal canal to explain patient's acute RIGHT-sided leg pain. Electronically Signed   By: Suzy Bouchard M.D.   On: 12/27/2016 09:44   Dg Hip Unilat With Pelvis 2-3 Views Right  Result Date: 12/22/2016 CLINICAL DATA:  Pain for 8 weeks.  Esophageal carcinoma. EXAM: DG HIP (WITH OR WITHOUT PELVIS) 2-3V RIGHT COMPARISON:  None. FINDINGS: Frontal pelvis as well as frontal and lateral right hip images were obtained. No fracture or dislocation. There is slight subchondral cystic change in the right hip joint. There is no appreciable joint space narrowing or erosion. No blastic or lytic bone lesions. Sacroiliac joints appear symmetric and normal bilaterally. IMPRESSION: Slight osteoarthritic change in the right hip joint. No fracture or dislocation. No blastic or lytic bone lesions evident. Electronically Signed   By: Lowella Grip III M.D.   On: 12/22/2016 09:45       IMPRESSION/PLAN: 1. Stage IV adenocarcinoma of the esophagus. Dr. Lisbeth Renshaw discusses the pathology findings and reviews the nature of advanced esophageal cancer. He reviews the findings form his PET scan and discusses with the patient that he doesn't think that the right iliac wing lesion would be causing the pain he's having, and while he's planning to start chemo next week, we will hold off on any radiotherapy for now. Regarding his swallowing,  this is also another site to consider, the low back is also a site to consider. At the point the patient wishes to hold off on radiation, but we will plan to review his case, and if he is not having improvement by his next interval scan, we will circle back to review these options. For purposes of making an informed decision, we discussed the risks, benefits, short, and long term effects of radiotherapy, and the  delivery and logistics of radiotherapy and if he was receiving chemo concurrently, Dr. Lisbeth Renshaw would consider and anticipate a course of 3 weeks.  We will follow up in about 2 months to see how he's fairing.   In a visit lasting 60 minutes, greater than 50% of the time was spent face to face discussing scenarios to consider adding radiation to his regimen, and coordinating the patient's care.  The above documentation reflects my direct findings during this shared patient visit. Please see the separate note by Dr. Lisbeth Renshaw on this date for the remainder of the patient's plan of care.    Carola Rhine, PAC

## 2017-01-01 ENCOUNTER — Other Ambulatory Visit: Payer: Self-pay | Admitting: Radiology

## 2017-01-01 ENCOUNTER — Other Ambulatory Visit: Payer: Self-pay | Admitting: Oncology

## 2017-01-02 ENCOUNTER — Telehealth: Payer: Self-pay | Admitting: *Deleted

## 2017-01-02 NOTE — Telephone Encounter (Signed)
Fax from [facility] call service: Pt's wife called to report N/V and diarrhea.  Called pt, he reports he feels better today after about 3 days of nausea vomiting and diarrhea. Pt reports this began when he stopped taking morphine.  He continues to feel weak but is eating and drinking small amounts. He does not want to come in to be seen sooner than scheduled appt. Reports he is starting to feel better. Encouraged him to call office if unable to tolerate POs. He agreed to do so. Message to MD for review.

## 2017-01-04 ENCOUNTER — Ambulatory Visit (HOSPITAL_COMMUNITY)
Admission: RE | Admit: 2017-01-04 | Discharge: 2017-01-04 | Disposition: A | Discharge status: Home / Self Care | Payer: No Typology Code available for payment source | Source: Ambulatory Visit | Attending: Radiation Oncology | Admitting: Radiation Oncology

## 2017-01-04 ENCOUNTER — Ambulatory Visit (HOSPITAL_COMMUNITY)
Admission: RE | Admit: 2017-01-04 | Discharge: 2017-01-04 | Disposition: A | Discharge status: Home / Self Care | Payer: No Typology Code available for payment source | Source: Ambulatory Visit | Attending: Oncology | Admitting: Oncology

## 2017-01-04 ENCOUNTER — Encounter: Payer: Self-pay | Admitting: Radiation Oncology

## 2017-01-04 ENCOUNTER — Other Ambulatory Visit: Payer: Self-pay | Admitting: Oncology

## 2017-01-04 ENCOUNTER — Telehealth: Payer: Self-pay | Admitting: *Deleted

## 2017-01-04 ENCOUNTER — Encounter (HOSPITAL_COMMUNITY): Payer: Self-pay

## 2017-01-04 DIAGNOSIS — C159 Malignant neoplasm of esophagus, unspecified: Secondary | ICD-10-CM | POA: Diagnosis not present

## 2017-01-04 DIAGNOSIS — C155 Malignant neoplasm of lower third of esophagus: Secondary | ICD-10-CM

## 2017-01-04 DIAGNOSIS — Z7952 Long term (current) use of systemic steroids: Secondary | ICD-10-CM | POA: Insufficient documentation

## 2017-01-04 DIAGNOSIS — Z882 Allergy status to sulfonamides status: Secondary | ICD-10-CM | POA: Diagnosis not present

## 2017-01-04 DIAGNOSIS — C787 Secondary malignant neoplasm of liver and intrahepatic bile duct: Secondary | ICD-10-CM | POA: Diagnosis not present

## 2017-01-04 DIAGNOSIS — Z87891 Personal history of nicotine dependence: Secondary | ICD-10-CM | POA: Insufficient documentation

## 2017-01-04 DIAGNOSIS — M069 Rheumatoid arthritis, unspecified: Secondary | ICD-10-CM | POA: Insufficient documentation

## 2017-01-04 HISTORY — PX: IR US GUIDE BX ASP/DRAIN: IMG2392

## 2017-01-04 HISTORY — PX: IR US GUIDE VASC ACCESS RIGHT: IMG2390

## 2017-01-04 HISTORY — PX: IR FLUORO GUIDE PORT INSERTION RIGHT: IMG5741

## 2017-01-04 LAB — COMPREHENSIVE METABOLIC PANEL
ALT: 42 U/L (ref 17–63)
AST: 50 U/L — AB (ref 15–41)
Albumin: 2.4 g/dL — ABNORMAL LOW (ref 3.5–5.0)
Alkaline Phosphatase: 254 U/L — ABNORMAL HIGH (ref 38–126)
Anion gap: 10 (ref 5–15)
BILIRUBIN TOTAL: 0.8 mg/dL (ref 0.3–1.2)
BUN: 29 mg/dL — AB (ref 6–20)
CALCIUM: 8.2 mg/dL — AB (ref 8.9–10.3)
CO2: 22 mmol/L (ref 22–32)
CREATININE: 0.64 mg/dL (ref 0.61–1.24)
Chloride: 105 mmol/L (ref 101–111)
GFR calc Af Amer: >60 mL/min (ref >60)
Glucose, Bld: 103 mg/dL — ABNORMAL HIGH (ref 65–99)
POTASSIUM: 4.1 mmol/L (ref 3.5–5.1)
Sodium: 137 mmol/L (ref 135–145)
TOTAL PROTEIN: 5.6 g/dL — AB (ref 6.5–8.1)

## 2017-01-04 LAB — CBC WITH DIFFERENTIAL/PLATELET
Basophils Absolute: 0 10*3/uL (ref 0.0–0.1)
Basophils Relative: 0 %
EOS PCT: 0 %
Eosinophils Absolute: 0 10*3/uL (ref 0.0–0.7)
HEMATOCRIT: 34.3 % — AB (ref 39.0–52.0)
Hemoglobin: 11.1 g/dL — ABNORMAL LOW (ref 13.0–17.0)
LYMPHS ABS: 0.8 10*3/uL (ref 0.7–4.0)
Lymphocytes Relative: 3 %
MCH: 27.8 pg (ref 26.0–34.0)
MCHC: 32.4 g/dL (ref 30.0–36.0)
MCV: 86 fL (ref 78.0–100.0)
MONOS PCT: 2 %
Monocytes Absolute: 0.5 10*3/uL (ref 0.1–1.0)
NEUTROS ABS: 23.8 10*3/uL — AB (ref 1.7–7.7)
Neutrophils Relative %: 95 %
Platelets: 255 10*3/uL (ref 150–400)
RBC: 3.99 MIL/uL — AB (ref 4.22–5.81)
RDW: 17.5 % — AB (ref 11.5–15.5)
WBC: 25.1 10*3/uL — AB (ref 4.0–10.5)

## 2017-01-04 LAB — PROTIME-INR
INR: 1.03
PROTHROMBIN TIME: 13.4 s (ref 11.4–15.2)

## 2017-01-04 MED ORDER — HEPARIN SOD (PORK) LOCK FLUSH 100 UNIT/ML IV SOLN
INTRAVENOUS | Status: AC
  Filled 2017-01-04: qty 5

## 2017-01-04 MED ORDER — GELATIN ABSORBABLE 12-7 MM EX MISC
CUTANEOUS | Status: AC
  Filled 2017-01-04: qty 1

## 2017-01-04 MED ORDER — CEFAZOLIN SODIUM-DEXTROSE 2-4 GM/100ML-% IV SOLN
INTRAVENOUS | Status: AC
  Administered 2017-01-04: 2 g via INTRAVENOUS
  Filled 2017-01-04: qty 100

## 2017-01-04 MED ORDER — LIDOCAINE HCL 1 % IJ SOLN
INTRAMUSCULAR | Status: AC | PRN
  Administered 2017-01-04: 20 mL
  Administered 2017-01-04: 10 mL

## 2017-01-04 MED ORDER — LIDOCAINE HCL 1 % IJ SOLN
INTRAMUSCULAR | Status: AC
  Filled 2017-01-04: qty 20

## 2017-01-04 MED ORDER — MIDAZOLAM HCL 2 MG/2ML IJ SOLN
INTRAMUSCULAR | Status: AC
  Filled 2017-01-04: qty 4

## 2017-01-04 MED ORDER — HEPARIN SOD (PORK) LOCK FLUSH 100 UNIT/ML IV SOLN
INTRAVENOUS | Status: AC | PRN
  Administered 2017-01-04: 500 [IU] via INTRAVENOUS

## 2017-01-04 MED ORDER — MIDAZOLAM HCL 2 MG/2ML IJ SOLN
INTRAMUSCULAR | Status: AC | PRN
  Administered 2017-01-04 (×4): 1 mg via INTRAVENOUS

## 2017-01-04 MED ORDER — SODIUM CHLORIDE 0.9 % IV SOLN
INTRAVENOUS | Status: DC
  Administered 2017-01-04: 12:00:00 via INTRAVENOUS

## 2017-01-04 MED ORDER — CEFAZOLIN SODIUM-DEXTROSE 2-4 GM/100ML-% IV SOLN
2.0000 g | INTRAVENOUS | Status: AC
  Administered 2017-01-04: 2 g via INTRAVENOUS

## 2017-01-04 MED ORDER — FENTANYL CITRATE (PF) 100 MCG/2ML IJ SOLN
INTRAMUSCULAR | Status: AC
  Filled 2017-01-04: qty 4

## 2017-01-04 MED ORDER — FENTANYL CITRATE (PF) 100 MCG/2ML IJ SOLN
INTRAMUSCULAR | Status: AC | PRN
  Administered 2017-01-04 (×4): 50 ug via INTRAVENOUS

## 2017-01-04 NOTE — Telephone Encounter (Signed)
Wife returned call. Informed her to call after port placement.

## 2017-01-04 NOTE — Progress Notes (Signed)
Rec'd [employer] form for RN/MD to complete. Given to nursing.

## 2017-01-04 NOTE — Discharge Instructions (Signed)
Moderate Conscious Sedation, Adult, Care After °These instructions provide you with information about caring for yourself after your procedure. Your health care provider may also give you more specific instructions. Your treatment has been planned according to current medical practices, but problems sometimes occur. Call your health care provider if you have any problems or questions after your procedure. °What can I expect after the procedure? °After your procedure, it is common: °· To feel sleepy for several hours. °· To feel clumsy and have poor balance for several hours. °· To have poor judgment for several hours. °· To vomit if you eat too soon. ° °Follow these instructions at home: °For at least 24 hours after the procedure: ° °· Do not: °? Participate in activities where you could fall or become injured. °? Drive. °? Use heavy machinery. °? Drink alcohol. °? Take sleeping pills or medicines that cause drowsiness. °? Make important decisions or sign legal documents. °? Take care of children on your own. °· Rest. °Eating and drinking °· Follow the diet recommended by your health care provider. °· If you vomit: °? Drink water, juice, or soup when you can drink without vomiting. °? Make sure you have little or no nausea before eating solid foods. °General instructions °· Have a responsible adult stay with you until you are awake and alert. °· Take over-the-counter and prescription medicines only as told by your health care provider. °· If you smoke, do not smoke without supervision. °· Keep all follow-up visits as told by your health care provider. This is important. °Contact a health care provider if: °· You keep feeling nauseous or you keep vomiting. °· You feel light-headed. °· You develop a rash. °· You have a fever. °Get help right away if: °· You have trouble breathing. °This information is not intended to replace advice given to you by your health care provider. Make sure you discuss any questions you have  with your health care provider. °Document Released: 11/07/2012 Document Revised: 06/22/2015 Document Reviewed: 05/09/2015 °Elsevier Interactive Patient Education © 2018 Else[employer] Inc[employer] °Implanted Port Home Guide °An implanted port is a type of central line that is placed under the skin. Central lines are used to provide IV access when treatment or nutrition needs to be given through a person’s veins. Implanted ports are used for long-term IV access. An implanted port may be placed because: °· You need IV medicine that would be irritating to the small veins in your hands or arms. °· You need long-term IV medicines, such as antibiotics. °· You need IV nutrition for a long period. °· You need frequent blood draws for lab tests. °· You need dialysis. ° °Implanted ports are usually placed in the chest area, but they can also be placed in the upper arm, the abdomen, or the leg. An implanted port has two main parts: °· Reservoir. The reservoir is round and will appear as a small, raised area under your skin. The reservoir is the part where a needle is inserted to give medicines or draw blood. °· Catheter. The catheter is a thin, flexible tube that extends from the reservoir. The catheter is placed into a large vein. Medicine that is inserted into the reservoir goes into the catheter and then into the vein. ° °How will I care for my incision site? °Do not get the incision site wet. Bathe or shower as directed by your health care provider. °How is my port accessed? °Special steps must be taken to access the port: °·   Before the port is accessed, a numbing cream can be placed on the skin. This helps numb the skin over the port site.  Your health care provider uses a sterile technique to access the port. ? Your health care provider must put on a mask and sterile gloves. ? The skin over your port is cleaned carefully with an antiseptic and allowed to dry. ? The port is gently pinched between sterile gloves, and a needle is  inserted into the port.  Only "non-coring" port needles should be used to access the port. Once the port is accessed, a blood return should be checked. This helps ensure that the port is in the vein and is not clogged.  If your port needs to remain accessed for a constant infusion, a clear (transparent) bandage will be placed over the needle site. The bandage and needle will need to be changed every week, or as directed by your health care provider.  Keep the bandage covering the needle clean and dry. Do not get it wet. Follow your health care providers instructions on how to take a shower or bath while the port is accessed.  If your port does not need to stay accessed, no bandage is needed over the port.  What is flushing? Flushing helps keep the port from getting clogged. Follow your health care providers instructions on how and when to flush the port. Ports are usually flushed with saline solution or a medicine called heparin. The need for flushing will depend on how the port is used.  If the port is used for intermittent medicines or blood draws, the port will need to be flushed: ? After medicines have been given. ? After blood has been drawn. ? As part of routine maintenance.  If a constant infusion is running, the port may not need to be flushed.  How long will my port stay implanted? The port can stay in for as long as your health care provider thinks it is needed. When it is time for the port to come out, surgery will be done to remove it. The procedure is similar to the one performed when the port was put in. When should I seek immediate medical care? When you have an implanted port, you should seek immediate medical care if:  You notice a bad smell coming from the incision site.  You have swelling, redness, or drainage at the incision site.  You have more swelling or pain at the port site or the surrounding area.  You have a fever that is not controlled with  medicine.  This information is not intended to replace advice given to you by your health care provider. Make sure you discuss any questions you have with your health care provider. Document Released: 01/17/2005 Document Revised: 06/25/2015 Document Reviewed: 09/24/2012 Elsevier Interactive Patient Education  2017 [employer] Inc[employer] Liver Biopsy The liver is a large organ in the upper right-hand side of your abdomen. A liver biopsy is a procedure in which a tissue sample is taken from the liver and examined under a microscope. The procedure is done to confirm a suspected problem. There are three types of liver biopsies:  Percutaneous. In this type, an incision is made in your abdomen. The sample is removed through the incision with a needle.  Laparoscopic. In this type, several incisions are made in the abdomen. A tiny camera is passed through one of the incisions to help guide the health care provider. The sample is removed through the other incision or  incisions.  Transjugular. In this type, an incision is made in the neck. A tube is passed through the incision to the liver. The sample is removed through the tube with a needle.  Tell a health care provider about:  Any allergies you have.  All medicines you are taking, including vitamins, herbs, eye drops, creams, and over-the-counter medicines.  Any problems you or family members have had with anesthetic medicines.  Any blood disorders you have.  Any surgeries you have had.  Any medical conditions you have.  Possibility of pregnancy, if this applies. What are the risks? Generally, this is a safe procedure. However, problems can occur and include:  Bleeding.  Infection.  Bruising.  Collapsed lung.  Leak of digestive juices (bile) from the liver or gallbladder.  Problems with heart rhythm.  Pain at the biopsy site or in the right shoulder.  Low blood pressure (hypotension).  Injury to nearby organs or tissues.  What  happens before the procedure?  Your health care provider may do some blood or urine tests. These will help your health care provider learn how well your kidneys and liver are working and how well your blood clots.  Ask your health care provider if you will be able to go home the day of the procedure. Arrange for someone to take you home and stay with you for at least 24 hours.  Do not eat or drink anything after midnight on the night before the procedure or as directed by your health care provider.  Ask your health care provider about: ? Changing or stopping your regular medicines. This is especially important if you are taking diabetes medicines or blood thinners. ? Taking medicines such as aspirin and ibuprofen. These medicines can thin your blood. Do not take these medicines before your procedure if your health care provider asks you not to. What happens during the procedure? Regardless of the type of biopsy that will be done, you will have an IV line placed. Through this line, you will receive fluids and medicine to relax you. If you will be having a laparoscopic biopsy, you may also receive medicine through this line to make you sleep during the procedure (general anesthetic). Percutaneous Liver Biopsy  You will positioned on your back, with your right hand over your head.  A health care provider will locate your liver by tapping and pressing on the right side of your abdomen or with the help of an ultrasound machine or CT scan.  An area at the bottom of your last right rib will be numbed.  An incision will be made in the numbed area.  The biopsy needle will be inserted into the incision.  Several samples of liver tissue will be taken with the biopsy needle. You will be asked to hold your breath as each sample is taken. Laparoscopic Liver Biopsy  You will be positioned on your back.  Several small incisions will be made in your abdomen.  Your doctor will pass a tiny camera  through one incision. The camera will allow the liver to be viewed on a TV monitor in the operating room.  Tools will be passed through the other incision or incisions. These tools will be used to remove samples of liver tissue. Transjugular Liver Biopsy  You will be positioned on your back on an X-ray table, with your head turned to your left.  An area on your neck just over your jugular vein will be numbed.  An incision will be made in  the numbed area.  A tiny tube will be inserted through the incision. It will be pushed through the jugular vein to a blood vessel in the liver called the hepatic vein.  Dye will be inserted through the tube, and X-rays will be taken. The dye will make the blood vessels in the liver light up on the X-rays.  The biopsy needle will be pushed through the tube until it reaches the liver.  Samples of liver tissue will be taken with the biopsy needle.  The needle and the tube will be removed. After the samples are obtained, the incision or incisions will be closed. What happens after the procedure?  You will be taken to a recovery area.  You may have to lie on your right side for 1-2 hours. This will prevent bleeding from the biopsy site.  Your progress will be watched. Your blood pressure, pulse, and the biopsy site will be checked often.  You may have some pain or feel sick. If this happens, tell your health care provider.  As you begin to feel better, you will be offered ice and beverages.  You may be allowed to go home when the medicines have worn off and you can walk, drink, eat, and use the bathroom. This information is not intended to replace advice given to you by your health care provider. Make sure you discuss any questions you have with your health care provider. Document Released: 04/09/2003 Document Revised: 06/22/2015 Document Reviewed: 03/15/2013 Elsevier Interactive Patient Education  [employer].

## 2017-01-04 NOTE — Discharge Instructions (Signed)
Liver Biopsy, Care After  These instructions give you information on caring for yourself after your procedure. Your doctor may also give you more specific instructions. Call your doctor if you have any problems or questions after your procedure.  Follow these instructions at home:  Rest at home for 1-2 days or as told by your doctor.  Have someone stay with you for at least 24 hours.  Do not do these things in the first 24 hours: ? Drive. ? Use machinery. ? Take care of other people. ? Sign legal documents. ? Take a bath or shower.  There are many different ways to close and cover a cut (incision). For example, a cut can be closed with stitches, skin glue, or adhesive strips. Follow your doctor's instructions on: ? Taking care of your cut. ? Changing and removing your bandage (dressing). ? Removing whatever was used to close your cut.  Do not drink alcohol in the first week.  Do not lift more than 5 pounds or play contact sports for the first 2 weeks.  Take medicines only as told by your doctor. For 1 week, do not take medicine that has aspirin in it or medicines like ibuprofen.  Get your test results.  Contact a doctor if:  A cut bleeds and leaves more than just a small spot of blood.  A cut is red, puffs up (swells), or hurts more than before.  Fluid or something else comes from a cut.  A cut smells bad.  You have a fever or chills.  Get help right away if:  You have swelling, bloating, or pain in your belly (abdomen).  You get dizzy or faint.  You have a rash.  You feel sick to your stomach (nauseous) or throw up (vomit).  You have trouble breathing, feel short of breath, or feel faint.  Your chest hurts.  You have problems talking or seeing.  You have trouble balancing or moving your arms or legs.  This information is not intended to replace advice given to you by your health care provider. Make sure you discuss any questions you have with your health  care provider. Document Released: 10/27/2007 Document Revised: 06/25/2015 Document Reviewed: 03/15/2013 Elsevier Interactive Patient Education  2018 [place] Insertion, Care After  Keep dressing on for 24 hours. You may bathe after 24 hours.   This sheet gives you information about how to care for yourself after your procedure. Your health care provider may also give you more specific instructions. If you have problems or questions, contact your health care provider. What can I expect after the procedure? After your procedure, it is common to have:  Discomfort at the port insertion site.  Bruising on the skin over the port. This should improve over 3-4 days.  Follow these instructions at home: [facility] care  After your port is placed, you will get a manufacturer's information card. The card has information about your port. Keep this card with you at all times.  Take care of the port as told by your health care provider. Ask your health care provider if you or a family member can get training for taking care of the port at home. A home health care nurse may also take care of the port.  Make sure to remember what type of port you have. Incision care  Follow instructions from your health care provider about how to take care of your port insertion site. Make sure you: ? Wash your  hands with soap and water before you change your bandage (dressing). If soap and water are not available, use hand sanitizer. ? Change your dressing as told by your health care provider. ? Leave stitches (sutures), skin glue, or adhesive strips in place. These skin closures may need to stay in place for 2 weeks or longer. If adhesive strip edges start to loosen and curl up, you may trim the loose edges. Do not remove adhesive strips completely unless your health care provider tells you to do that.  Check your port insertion site every day for signs of infection. Check for: ? More redness,  swelling, or pain. ? More fluid or blood. ? Warmth. ? Pus or a bad smell. General instructions  Do not take baths, swim, or use a hot tub until your health care provider approves.  Do not lift anything that is heavier than 10 lb (4.5 kg) for a week, or as told by your health care provider.  Ask your health care provider when it is okay to: ? Return to work or school. ? Resume usual physical activities or sports.  Do not drive for 24 hours if you were given a medicine to help you relax (sedative).  Take over-the-counter and prescription medicines only as told by your health care provider.  Wear a medical alert bracelet in case of an emergency. This will tell any health care providers that you have a port.  Keep all follow-up visits as told by your health care provider. This is important. Contact a health care provider if:  You cannot flush your port with saline as directed, or you cannot draw blood from the port.  You have a fever or chills.  You have more redness, swelling, or pain around your port insertion site.  You have more fluid or blood coming from your port insertion site.  Your port insertion site feels warm to the touch.  You have pus or a bad smell coming from the port insertion site. Get help right away if:  You have chest pain or shortness of breath.  You have bleeding from your port that you cannot control. Summary  Take care of the port as told by your health care provider.  Change your dressing as told by your health care provider.  Keep all follow-up visits as told by your health care provider. This information is not intended to replace advice given to you by your health care provider. Make sure you discuss any questions you have with your health care provider. Document Released: 11/07/2012 Document Revised: 12/09/2015 Document Reviewed: 12/09/2015 Elsevier Interactive Patient Education  2017 [employer].

## 2017-01-04 NOTE — Telephone Encounter (Signed)
Left message for wife to call after port placement.

## 2017-01-04 NOTE — Consult Note (Signed)
Chief Complaint: Patient was seen in consultation today for image guided liver lesion biopsy and Port-A-Cath placement.  Referring Physician(s): Ladell Pier  Supervising Physician: Marybelle Killings  Patient Status: [facility] - Out-pt  History of Present Illness: Grant Todd is a 61 y.o. 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  He presents today for image guided liver lesion biopsy to confirm metastatic disease and for molecular testing and Port-A-Cath placement for chemotherapy.  Past Medical History:  Diagnosis Date  . Elevated LFTs   . Esophageal mass   . Pleural effusion on left   . Posterior tibial tendon dysfunction, right   . Rheumatoid arthritis ([place])   . Seasonal allergic rhinitis   . Severe esophageal dysplasia   . Tobacco use     Past Surgical History:  Procedure Laterality Date  . CHOLECYSTECTOMY    . TONSILLECTOMY      Allergies: Chantix [varenicline]; Methotrexate derivatives; Shellfish allergy; and Sulfonamide derivatives  Medications: Prior to Admission medications   Medication Sig Start Date End Date Taking? Authorizing Provider  acetaminophen (TYLENOL 8 HOUR) 650 MG CR tablet Take 1,300 mg by mouth every 8 (eight) hours as needed for pain.   Yes [provider]  CARAFATE 1 GM/10ML suspension Take 1 g by mouth 2 (two) times daily.  12/14/16  Yes [provider]  cyclobenzaprine (FLEXERIL) 5 MG tablet Take 1 tablet (5 mg total) by mouth 3 (three) times daily as needed for muscle spasms. 12/23/16  Yes Owens Shark, NP  dexamethasone (DECADRON) 4 MG tablet Take 1 tablet (4 mg total) by mouth daily. 12/28/16  Yes Owens Shark, NP  HYDROcodone-acetaminophen (NORCO/VICODIN) 5-325 MG tablet Take 1 tablet by mouth every 6 (six) hours as needed for moderate pain.   Yes [provider]  morphine (MS  CONTIN) 30 MG 12 hr tablet Take 1 tablet (30 mg total) by mouth every 12 (twelve) hours. 12/23/16  Yes Owens Shark, NP  Multiple Vitamins-Minerals (MULTIVITAMIN ADULT PO) Take 1 tablet by mouth daily.   Yes [provider]  Omega-3 Fatty Acids (FISH OIL) 1200 MG CAPS Take 2 capsules by mouth 2 (two) times daily.   Yes [provider]  oxyCODONE (OXY IR/ROXICODONE) 5 MG immediate release tablet Take 1 tablet (5 mg total) by mouth every 4 (four) hours as needed for severe pain (Take 1-2 Tablets by mouth very 4 hours as needed for severe pain). 12/21/16  Yes Ladell Pier, MD  predniSONE (DELTASONE) 5 MG tablet Take 1 tablet by mouth daily. 11/21/16  Yes [provider]     Family History  Problem Relation Age of Onset  . Uterine cancer Mother   . Hyperlipidemia Father   . CVA Father   . Head & neck cancer Father   . Testicular cancer Son     Social History   Socioeconomic History  . Marital status: Married    Spouse name: None  . Number of children: None  . Years of education: None  . Highest education level: None  Social Needs  . Financial resource strain: None  . Food insecurity - worry: None  . Food insecurity - inability: None  . Transportation needs - medical: None  . Transportation needs - non-medical: None  Occupational History  . Occupation: Dipl[employer] Services operational officer  Tobacco Use  . Smoking status: Former Smoker    Types: Cigarettes  Last attempt to quit: 03/03/2001    Years since quitting: 15.8  . Smokeless tobacco: Never Used  Substance and Sexual Activity  . Alcohol use: Yes    Comment: social  . Drug use: No  . Sexual activity: Yes    Partners: Female    Comment: married  Other Topics Concern  . None  Social History Narrative  . None      Review of Systems currently denies fever, headache, chest pain, nausea, vomiting or bleeding.  He does have dysphagia, weight loss, dyspnea, occasional cough, back pain  Vital Signs: BP 118/85 (BP  Location: Right Arm)   Pulse 94   Temp 97.9 F (36.6 C) (Oral)   Resp 18   SpO2 100%   Physical Exam awake, alert.  Chest clear to auscultation bilaterally.  Heart with regular rate and rhythm.  Abdomen soft, positive bowel sounds, mild diffuse tenderness to palpation; no significant lower extremity edema.  Imaging: Ct Chest W Contrast  Result Date: 12/16/2016 CLINICAL DATA:  61 year old male with esophageal mass identified on endoscopy yesterday. Recent weight loss. EXAM: CT CHEST, ABDOMEN, AND PELVIS WITH CONTRAST TECHNIQUE: Multidetector CT imaging of the chest, abdomen and pelvis was performed following the standard protocol during bolus administration of intravenous contrast. CONTRAST:  154mL ISOVUE-300 IOPAMIDOL (ISOVUE-300) INJECTION 61% COMPARISON:  08/13/2015 and prior CTs. FINDINGS: CT CHEST FINDINGS Cardiovascular: Upper limits normal heart size noted. Mild coronary artery calcifications identified. No evidence of thoracic aortic aneurysm or pericardial effusion. Mediastinum/Nodes: Circumferential wall thickening of the distal esophagus is noted. Multiple large lymph nodes are identified throughout the mediastinum and right hilum with index nodes as follows (series 3): A 3.5 cm right peritracheal node (image 24) 83 cm right hilar node (image 31) A 3.5 cm subcarinal node (image 33). Lungs/Pleura: Mild airspace/ground-glass opacity within the inferior right lower lobe likely represents a small area of infection or possibly aspiration. Mild bibasilar atelectasis/ scarring noted. A small loculated left pleural effusion and associated mild left atelectasis is unchanged. Centrilobular emphysema is again noted. Musculoskeletal: The no acute bony abnormalities or suspicious bony lesions identified. CT ABDOMEN PELVIS FINDINGS Hepatobiliary: A 3.5 x 4.5 cm inferior hepatic mass (junction of the right and left hepatic lobes, image 71 series 3) and a 1.5 cm inferior right hepatic mass (image 76, series  3) likely represent metastases. Remainder of the liver is unremarkable. The patient is status post cholecystectomy. No biliary dilatation. Pancreas: Unremarkable Spleen: Unremarkable Adrenals/Urinary Tract: The kidneys, adrenal glands and bladder are unremarkable. Stomach/Bowel: There is no evidence of bowel obstruction. Colonic diverticulosis noted without evidence of diverticulitis. Distal esophageal wall thickening is again noted without other bowel wall thickening identified. The appendix is normal. Vascular/Lymphatic: Multiple enlarged lymph nodes in the upper abdomen, retroperitoneum and mesenteric noted with index nodes as follows (series 3): A 3.1 x 5.5 cm portal caval nodal mass (image 63) A 2.5 cm left periaortic node (image 71) A 2.8 cm mesenteric node (image 87) Aortic atherosclerotic calcifications are noted without aneurysm. Reproductive: Prostate enlargement noted. Other: No ascites, pneumoperitoneum or abscess. Small bilateral inguinal hernias containing fat are present. Musculoskeletal: No acute bony abnormalities or suspicious bony lesions identified. Mild degenerative changes in the lumbar spine are present. IMPRESSION: 1. Diffuse circumferential wall thickening of the distal esophagus likely representing the mass visualized on recent endoscopy and highly suspicious for malignancy. 2. Extensive chest and abdominal lymphadenopathy with 2 hepatic masses, compatible with metastatic disease. 3. Small area of airspace disease/ground-glass opacity within the right  upper lobe, likely representing aspiration or pneumonia. 4. Aortic Atherosclerosis (ICD10-I70.0) and Emphysema (ICD10-J43.9). These results will be called to the ordering clinician or representative by the Radiologist Assistant, and communication documented in the PACS or zVision Dashboard. Electronically Signed   By: Margarette Canada M.D.   On: 12/16/2016 14:14   Mr Lumbar Spine W Wo Contrast  Result Date: 12/20/2016 CLINICAL DATA:   61 year old male recently diagnosed with esophageal cancer. Severe low back pain greater on the right and radiating to the right leg with weakness for 2 months. No known injury. EXAM: MRI LUMBAR SPINE WITHOUT AND WITH CONTRAST TECHNIQUE: Multiplanar and multiecho pulse sequences of the lumbar spine were obtained without and with intravenous contrast. CONTRAST:  69mL MULTIHANCE GADOBENATE DIMEGLUMINE 529 MG/ML IV SOLN COMPARISON:  CT chest abdomen and pelvis 12/16/2016 FINDINGS: Segmentation:  Normal as demonstrated on the recent CT. Alignment: Stable vertebral height and alignment. Relatively maintained lumbar lordosis except for mild grade 1 anterolisthesis of L4 on L5 and trace retrolisthesis of L5 on S1. Vertebrae: In the central S1 vertebral body there is a small 6-7 mm round focus of decreased T1/T2 signal with enhancement and increased STIR signal. This was not evident on the recent CT. No similar bone lesion is identified elsewhere. However, there is bulky malignant retroperitoneal and prevertebral lymphadenopathy throughout the lower thoracic and lumbar spine. There is a superimposed 17 mm spiculated metastasis along the posterior margin of the left psoas muscle at the L5 level (series 15, image 18 and series 18, image 35. Nearby a left lateral endplate marrow edema at L5-S1 is favored to be degenerative in nature. There is similar degenerative appearing right lateral endplate marrow edema at L4-L5. Conus medullaris and cauda equina: Conus extends to the L1 level. No abnormal intradural enhancement. No nerve root thickening. Conus and cauda equina appear normal. Paraspinal and other soft tissues: Bulky abdominal and pelvic retroperitoneal/ prevertebral lymphadenopathy as stated earlier. Small left lower so [place] muscle metastasis. No other paraspinal muscle metastasis identified. Disc levels: Lumbar spine degeneration is most pronounced: L4-L5: Mild anterolisthesis. Right eccentric circumferential disc bulge  with endplate spurring and broad-based posterior component. Moderate facet and ligament flavum hypertrophy with trace left facet joint fluid. Multifactorial moderate to severe right L4 neural foraminal stenosis and mild right lateral recess stenosis. No significant spinal stenosis. L5-S1: Disc space loss with left eccentric circumferential disc bulge and endplate spurring. Mild facet hypertrophy. Moderate to severe left L5 neural foraminal stenosis. IMPRESSION: 1. Small 7 mm enhancing lesion in the central S1 vertebral body is nonspecific but suspicious for early osseous metastatic disease in this clinical setting. No other spinal or bone metastasis metastasis identified. 2. Bulky retroperitoneal and prevertebral malignant lymphadenopathy throughout the lower thoracic and lumbar spine. 3. Small posterior left psoas muscle metastasis measuring 17 mm at the left L5 level. No other paraspinal muscle metastasis identified. 4. L4-L5 and L5-S1 lumbar spine degeneration with mild spondylolisthesis, severe right L4 and left L5 neural foraminal stenosis, but no significant spinal stenosis. Electronically Signed   By: Genevie Ann M.D.   On: 12/20/2016 16:36   Ct Abdomen Pelvis W Contrast  Result Date: 12/16/2016 CLINICAL DATA:  61 year old male with esophageal mass identified on endoscopy yesterday. Recent weight loss. EXAM: CT CHEST, ABDOMEN, AND PELVIS WITH CONTRAST TECHNIQUE: Multidetector CT imaging of the chest, abdomen and pelvis was performed following the standard protocol during bolus administration of intravenous contrast. CONTRAST:  158mL ISOVUE-300 IOPAMIDOL (ISOVUE-300) INJECTION 61% COMPARISON:  08/13/2015 and prior CTs.  FINDINGS: CT CHEST FINDINGS Cardiovascular: Upper limits normal heart size noted. Mild coronary artery calcifications identified. No evidence of thoracic aortic aneurysm or pericardial effusion. Mediastinum/Nodes: Circumferential wall thickening of the distal esophagus is noted. Multiple large  lymph nodes are identified throughout the mediastinum and right hilum with index nodes as follows (series 3): A 3.5 cm right peritracheal node (image 24) 83 cm right hilar node (image 31) A 3.5 cm subcarinal node (image 33). Lungs/Pleura: Mild airspace/ground-glass opacity within the inferior right lower lobe likely represents a small area of infection or possibly aspiration. Mild bibasilar atelectasis/ scarring noted. A small loculated left pleural effusion and associated mild left atelectasis is unchanged. Centrilobular emphysema is again noted. Musculoskeletal: The no acute bony abnormalities or suspicious bony lesions identified. CT ABDOMEN PELVIS FINDINGS Hepatobiliary: A 3.5 x 4.5 cm inferior hepatic mass (junction of the right and left hepatic lobes, image 71 series 3) and a 1.5 cm inferior right hepatic mass (image 76, series 3) likely represent metastases. Remainder of the liver is unremarkable. The patient is status post cholecystectomy. No biliary dilatation. Pancreas: Unremarkable Spleen: Unremarkable Adrenals/Urinary Tract: The kidneys, adrenal glands and bladder are unremarkable. Stomach/Bowel: There is no evidence of bowel obstruction. Colonic diverticulosis noted without evidence of diverticulitis. Distal esophageal wall thickening is again noted without other bowel wall thickening identified. The appendix is normal. Vascular/Lymphatic: Multiple enlarged lymph nodes in the upper abdomen, retroperitoneum and mesenteric noted with index nodes as follows (series 3): A 3.1 x 5.5 cm portal caval nodal mass (image 63) A 2.5 cm left periaortic node (image 71) A 2.8 cm mesenteric node (image 87) Aortic atherosclerotic calcifications are noted without aneurysm. Reproductive: Prostate enlargement noted. Other: No ascites, pneumoperitoneum or abscess. Small bilateral inguinal hernias containing fat are present. Musculoskeletal: No acute bony abnormalities or suspicious bony lesions identified. Mild degenerative  changes in the lumbar spine are present. IMPRESSION: 1. Diffuse circumferential wall thickening of the distal esophagus likely representing the mass visualized on recent endoscopy and highly suspicious for malignancy. 2. Extensive chest and abdominal lymphadenopathy with 2 hepatic masses, compatible with metastatic disease. 3. Small area of airspace disease/ground-glass opacity within the right upper lobe, likely representing aspiration or pneumonia. 4. Aortic Atherosclerosis (ICD10-I70.0) and Emphysema (ICD10-J43.9). These results will be called to the ordering clinician or representative by the Radiologist Assistant, and communication documented in the PACS or zVision Dashboard. Electronically Signed   By: Margarette Canada M.D.   On: 12/16/2016 14:14   Nm Pet Image Initial (pi) Skull Base To Thigh  Result Date: 12/27/2016 CLINICAL DATA:  Initial treatment strategy for esophageal carcinoma. Stage IV carcinoma suspected. RIGHT flank pain and leg weakness. EXAM: NUCLEAR MEDICINE PET SKULL BASE TO THIGH TECHNIQUE: 8.1 mCi F-18 FDG was injected intravenously. Full-ring PET imaging was performed from the skull base to thigh after the radiotracer. CT data was obtained and used for attenuation correction and anatomic localization. FASTING BLOOD GLUCOSE:  Value: 110 mg/dl COMPARISON:  CT 12/16/2016, MRI 12/20/2016 FINDINGS: NECK Hypermetabolic RIGHT supraclavicular lymph node SUV max equals 17.1. Node measures 16 mm just beneath the sternocleidomastoid muscle (image 42, series 4). CHEST Bulky hypermetabolic mediastinal lymph nodes. Lymph nodes in the paratracheal and subcarinal nodal stations. Example lymph node includes 30 mm short axis RIGHT lower paratracheal lymph node with SUV max equal 40.3 Hypermetabolic RIGHT hilar lymph node additionally. A chain of hypermetabolic lymph nodes extend in the RIGHT infraclavicular nodal station along the clavicle. Hypermetabolic LEFT infraclavicular node additionally. There is a fine  nodular pattern in the RIGHT upper lobe (image 75, series 4) with mild associated metabolic activity. No discrete hypermetabolic pulmonary nodules. There is hypermetabolic activity extending superiorly from the RIGHT hilum along the vasculature with SUV max equal 5.5. Additional hypermetabolic subcutaneous nodules in the thorax. One nodule along the lateral margin of the RIGHT pectoralis muscle in the RIGHT axilla. Similar nodule superficial to the LEFT shoulder musculature at the level the scapula with SUV max equals 5.6 (image 61). ABDOMEN/PELVIS Intensely hypermetabolic mass in distal esophagus at the gastroesophageal junction with focal wall thickening (SUV max equal 15.3). There are multiple enlarged hypermetabolic gastrohepatic ligament lymph nodes and bulky periportal lymph nodes. Periportal lymph node with SUV max equal 18.9. Three intensely hypermetabolic metastatic lesions in the RIGHT hepatic lobe. The largest measures 5.4 cm with SUV max equal 18.6. Bulky Hypermetabolic adenopathy extends in the retroperitoneum along the aorta and IVC. Hypermetabolic adenopathy extends superiorly to the retrocrural lymph nodes. Hypermetabolic adenopathy extends inferiorly to the level of the LEFT iliac vessels. There is hypermetabolic nodal metastasis within the mesenteries of the RIGHT small bowel with SUV max equal 14.5 SKELETON Hypermetabolic lesion in the LEFT sacral ala with SUV max equal 8.2. Subtle lesion on the CT portion exam measures 9 mm (image 159, series 4). Hypermetabolic lesion in the RIGHT iliac wing without clear CT lesion. Hypermetabolic lesion at the inferior tip of the LEFT scapula. Focal muscular metastasis in the LEFT gluteal musculature with SUV max equal 13. Soft tissue metastasis adjacent to the posterior LEFT sixth rib. Additional soft tissue retroperitoneal metastasis along the LEFT psoas muscle. No hypermetabolic lesion in the lumbar spine or spinal canal to explain patient's RIGHT -sided leg  pain weakness. IMPRESSION: 1. Hypermetabolic mass at the gastroesophageal junction consistent esophageal carcinoma. Unfortunately, evidence of widespread metastatic disease to multiple organ systems. 2. Extensive hypermetabolic metastatic adenopathy involving the RIGHT supraclavicular nodal station, mediastinal lymph nodes, retrocrural node lymph nodes, periaortic lymph nodes, LEFT iliac lymph nodes, and central mesenteric lymph nodes. The RIGHT supraclavicular lymph node would be assessable to biopsy. 3. Fine nodularity in the RIGHT upper lobe with hypermetabolic thickening in the RIGHT suprahilar peribronchial structures. Differential would include a pulmonary infection or inflammation versus lymphangitic spread of carcinoma. 4. Hypermetabolic metastasis to the RIGHT hepatic lobe. 5. Several small skeletal metastasis including RIGHT iliac bone, LEFT sacrum, and LEFT scapula. 6. Multiple unusual soft tissue metastasis to the subcutaneous tissues adjacent to the LEFT scapular musculature, retroperitoneal nodal metastasis along the LEFT psoas muscle and metastatic lesion within the LEFT gluteal musculature. 7. No lesion in the lumbar spine or spinal canal to explain patient's acute RIGHT-sided leg pain. Electronically Signed   By: Suzy Bouchard M.D.   On: 12/27/2016 09:44   Dg Hip Unilat With Pelvis 2-3 Views Right  Result Date: 12/22/2016 CLINICAL DATA:  Pain for 8 weeks.  Esophageal carcinoma. EXAM: DG HIP (WITH OR WITHOUT PELVIS) 2-3V RIGHT COMPARISON:  None. FINDINGS: Frontal pelvis as well as frontal and lateral right hip images were obtained. No fracture or dislocation. There is slight subchondral cystic change in the right hip joint. There is no appreciable joint space narrowing or erosion. No blastic or lytic bone lesions. Sacroiliac joints appear symmetric and normal bilaterally. IMPRESSION: Slight osteoarthritic change in the right hip joint. No fracture or dislocation. No blastic or lytic bone  lesions evident. Electronically Signed   By: Lowella Grip III M.D.   On: 12/22/2016 09:45    Labs:  CBC: Recent Labs  12/28/16 1156  WBC 21.8*  HGB 12.0*  HCT 37.3*  PLT 295    COAGS: No results for input(s): INR, APTT in the last 8760 hours.  BMP: Recent Labs    12/28/16 1156  NA 137  K 4.3  CO2 24  GLUCOSE 94  BUN 31.8*  CALCIUM 9.2  CREATININE 0.9    LIVER FUNCTION TESTS: Recent Labs    12/28/16 1156  BILITOT 0.90  AST 51*  ALT 74*  ALKPHOS 291*  PROT 6.3*  ALBUMIN 2.8*    TUMOR MARKERS: No results for input(s): AFPTM, CEA, CA199, CHROMGRNA in the last 8760 hours.  Assessment and Plan: 61 y.o. 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  He presents today for image guided liver lesion biopsy to confirm metastatic disease and for molecular testing and Port-A-Cath placement for chemotherapy.Risks and benefits discussed with the patient/spouse including, but not limited to bleeding, infection, pneumothorax, or fibrin sheath development and need for additional procedures.All of the patient's questions were answered, patient is agreeable to proceed.Consent signed and in chart.LABS PEND.     Thank you for this interesting consult.  I greatly enjoyed meeting [facility] and look forward to participating in their care.  A copy of this report was sent to the requesting provider on this date.  Electronically Signed: D. Rowe Robert, PA-C 01/04/2017, 11:36 AM   I spent a total of 25 minutes  in face to face in clinical consultation, greater than 50% of which was counseling/coordinating care for image guided liver lesion biopsy and Port-A-Cath placement

## 2017-01-04 NOTE — Procedures (Signed)
RIJV PAC SVC RA  R lobe liver lesion 18 g core Bx times three  EBL 0 COMP 0

## 2017-01-05 ENCOUNTER — Ambulatory Visit (HOSPITAL_BASED_OUTPATIENT_CLINIC_OR_DEPARTMENT_OTHER): Payer: PRIVATE HEALTH INSURANCE | Admitting: Oncology

## 2017-01-05 ENCOUNTER — Other Ambulatory Visit: Payer: Self-pay | Admitting: *Deleted

## 2017-01-05 ENCOUNTER — Ambulatory Visit (HOSPITAL_BASED_OUTPATIENT_CLINIC_OR_DEPARTMENT_OTHER): Payer: PRIVATE HEALTH INSURANCE

## 2017-01-05 ENCOUNTER — Other Ambulatory Visit: Payer: Self-pay

## 2017-01-05 ENCOUNTER — Encounter: Payer: Self-pay | Admitting: Oncology

## 2017-01-05 VITALS — BP 106/62 | HR 106 | Temp 97.9°F | Resp 17 | Ht 68.0 in | Wt 152.6 lb

## 2017-01-05 DIAGNOSIS — J90 Pleural effusion, not elsewhere classified: Secondary | ICD-10-CM | POA: Diagnosis not present

## 2017-01-05 DIAGNOSIS — C155 Malignant neoplasm of lower third of esophagus: Secondary | ICD-10-CM

## 2017-01-05 DIAGNOSIS — Z5111 Encounter for antineoplastic chemotherapy: Secondary | ICD-10-CM

## 2017-01-05 DIAGNOSIS — M069 Rheumatoid arthritis, unspecified: Secondary | ICD-10-CM | POA: Diagnosis not present

## 2017-01-05 MED ORDER — OXALIPLATIN CHEMO INJECTION 100 MG/20ML
83.0000 mg/m2 | Freq: Once | INTRAVENOUS | Status: AC
  Administered 2017-01-05: 150 mg via INTRAVENOUS
  Filled 2017-01-05: qty 10

## 2017-01-05 MED ORDER — DEXAMETHASONE SODIUM PHOSPHATE 10 MG/ML IJ SOLN
INTRAMUSCULAR | Status: AC
  Filled 2017-01-05: qty 1

## 2017-01-05 MED ORDER — DEXAMETHASONE SODIUM PHOSPHATE 10 MG/ML IJ SOLN
10.0000 mg | Freq: Once | INTRAMUSCULAR | Status: AC
  Administered 2017-01-05: 10 mg via INTRAVENOUS

## 2017-01-05 MED ORDER — PANTOPRAZOLE SODIUM 40 MG PO TBEC: 40.0000 mg | DELAYED_RELEASE_TABLET | Freq: Every day | ORAL | 1 refills | Status: DC

## 2017-01-05 MED ORDER — SODIUM CHLORIDE 0.9 % IV SOLN
2400.0000 mg/m2 | INTRAVENOUS | Status: DC
  Administered 2017-01-05: 4350 mg via INTRAVENOUS
  Filled 2017-01-05: qty 87

## 2017-01-05 MED ORDER — PALONOSETRON HCL INJECTION 0.25 MG/5ML
INTRAVENOUS | Status: AC
  Filled 2017-01-05: qty 5

## 2017-01-05 MED ORDER — FLUCONAZOLE 100 MG PO TABS: 100.0000 mg | ORAL_TABLET | Freq: Every day | ORAL | 0 refills | Status: AC

## 2017-01-05 MED ORDER — PALONOSETRON HCL INJECTION 0.25 MG/5ML
0.2500 mg | Freq: Once | INTRAVENOUS | Status: AC
  Administered 2017-01-05: 0.25 mg via INTRAVENOUS

## 2017-01-05 MED ORDER — DEXTROSE 5 % IV SOLN
Freq: Once | INTRAVENOUS | Status: AC
  Administered 2017-01-05: 13:00:00 via INTRAVENOUS

## 2017-01-05 MED ORDER — LEUCOVORIN CALCIUM INJECTION 350 MG
400.0000 mg/m2 | Freq: Once | INTRAMUSCULAR | Status: AC
  Administered 2017-01-05: 728 mg via INTRAVENOUS
  Filled 2017-01-05: qty 36.4

## 2017-01-05 NOTE — Progress Notes (Signed)
Nutrition Assessment  Reason for Assessment: RD consulted via phone call for weight loss   ASSESSMENT: 61 year old male diagnosed with metastatic cancer of esophagus is a patient of Dr. Benay Spice  PMH consists of tobacco use, severe back pain, rheumatoid arthritis   Spoke with pt and pt's wife during infusion.  Pt eating ~4 times daily, typical foods consist of soup, 1/2 sandwich, pudding, lots of eggs. RD recommended increasing frequency of meals. RD educated pt and pt's wife on increasing the calories and protein consumed at meal times in general but especially if unable to increase frequency.  Pt discontinued Ensure supplementation r/t diarrhea. RD provided additional samples of alternative options and coupons. RD also discussed other recipes to promote continued intake.  Pt denies any nutrition impact symptoms at this time except occasional constipation and gas, RD provided handout and educated pt on helpful methods to alleviate this.  If poor PO intake continues, weight continues to decline, and within goals of care pt may benefit from a feeding tube and supplemental nutrition.   Nutrition Focused Physical Exam: deferred   Medications: Decadron, Multivitamin, Protonix, Deltasone  Labs: Glucose 103, Albumin 2.4  Anthropometrics:   Height: 5' 8'' Weight: 152 lb (down from 162 lb on 11/23) BMI: 23.2  Pt with significant weight loss of 7% in 1 month.   Estimated Energy Needs  Kcals: 2100-2300 Protein: 105-115 grams Fluid: >/= 2 L/d  NUTRITION DIAGNOSIS: Unintentional weight loss related to cancer and cancer related treatments as evidenced by 7% weight loss in 1 month  INTERVENTION:  Educated pt and pt's wife on high-calorie, high-protein diet. Educated pt on gradually increasing meal frequency to 6 times per day.   Discussed nutrition impact symptom and ways to alleviate. Provided nutritional supplement samples and encouraged pt to try alternatives.  Contact information  provided. Fact sheets given.  Questions answered and teach back method used.   MONITORING, EVALUATION, GOAL: Pt will increase calorie and protein intake to minimize weight loss and promote healing  NEXT VISIT: To be scheduled with treatments  Parks Ranger, MS, RDN, LDN 01/05/2017 5:23 PM

## 2017-01-05 NOTE — Progress Notes (Signed)
[facility] OFFICE PROGRESS NOTE   Diagnosis: Esophagus cancer  INTERVAL HISTORY:   Grant Todd returns as scheduled.  He underwent an ultrasound-guided biopsy of a liver lesion and Port-A-Cath placement yesterday.  He reports tolerating the procedure well. He continues to feel "weak".  He has right leg pain and weakness.  He is ambulatory.  He saw neurosurgery and says they feel the pain is related to lumbar disc disease.  He is being scheduled for a lumbar steroid injection. He continues Decadron at a dose of 4 mg daily.  The pain has improved compared to when we first saw him.  He is no longer taking MS Contin or oxycodone.  He takes hydrocodone as needed for pain. He is tolerating liquids and some solids.  Objective:  Vital signs in last 24 hours:  Blood pressure 106/62, pulse (!) 106, temperature 97.9 F (36.6 C), temperature source Oral, resp. rate 17, height 5\' 8"  (1.727 m), weight 152 lb 9.6 oz (69.2 kg), SpO2 95 %.    HEENT: Thrush at the tongue and buccal mucosa Resp: Lungs clear bilaterally Cardio: Regular rate and rhythm GI: No hepatomegaly, nontender Vascular: No leg edema Neuro: Weakness with flexion at the right hip    Portacath/PICC-without erythema  Lab Results:  Lab Results  Component Value Date   WBC 25.1 (H) 01/04/2017   HGB 11.1 (L) 01/04/2017   HCT 34.3 (L) 01/04/2017   MCV 86.0 01/04/2017   PLT 255 01/04/2017   NEUTROABS 23.8 (H) 01/04/2017    CMP     Component Value Date/Time   NA 137 01/04/2017 1135   NA 137 12/28/2016 1156   K 4.1 01/04/2017 1135   K 4.3 12/28/2016 1156   CL 105 01/04/2017 1135   CO2 22 01/04/2017 1135   CO2 24 12/28/2016 1156   GLUCOSE 103 (H) 01/04/2017 1135   GLUCOSE 94 12/28/2016 1156   BUN 29 (H) 01/04/2017 1135   BUN 31.8 (H) 12/28/2016 1156   CREATININE 0.64 01/04/2017 1135   CREATININE 0.9 12/28/2016 1156   CALCIUM 8.2 (L) 01/04/2017 1135   CALCIUM 9.2 12/28/2016 1156   PROT 5.6 (L)  01/04/2017 1135   PROT 6.3 (L) 12/28/2016 1156   ALBUMIN 2.4 (L) 01/04/2017 1135   ALBUMIN 2.8 (L) 12/28/2016 1156   AST 50 (H) 01/04/2017 1135   AST 51 (H) 12/28/2016 1156   ALT 42 01/04/2017 1135   ALT 74 (H) 12/28/2016 1156   ALKPHOS 254 (H) 01/04/2017 1135   ALKPHOS 291 (H) 12/28/2016 1156   BILITOT 0.8 01/04/2017 1135   BILITOT 0.90 12/28/2016 1156   GFRNONAA >60 01/04/2017 1135   GFRAA >60 01/04/2017 1135    Lab Results  Component Value Date   CEA1 5,873.34 (H) 12/28/2016    Lab Results  Component Value Date   INR 1.03 01/04/2017    Imaging:  Ir US Guide Vasc Access Right  Result Date: 01/04/2017 CLINICAL DATA:  Esophageal mass EXAM: TUNNEL POWER PORT PLACEMENT WITH SUBCUTANEOUS POCKET UTILIZING ULTRASOUND & FLOUROSCOPY FLUOROSCOPY TIME:  18 seconds.  Eighteen mGy. MEDICATIONS AND MEDICAL HISTORY: Versed to mg, Fentanyl 100 mcg. Additional Medications: Ancef 2 g. Antibiotics were given within 2 hours of the procedure. ANESTHESIA/SEDATION: Moderate sedation time: 30 minutes. Nursing monitored the the patient during the procedure. PROCEDURE: After written informed consent was obtained, patient was placed in the supine position on angiographic table. The right neck and chest was prepped and draped in a sterile fashion. Lidocaine was utilized for  local anesthesia. The right jugular vein was noted to be patent initially with ultrasound. Under sonographic guidance, a micropuncture needle was inserted into the right IJ vein (Ultrasound and fluoroscopic image documentation was performed). The needle was removed over an 018 wire which was exchanged for a Amplatz. This was advanced into the IVC. An 8-French dilator was advanced over the Amplatz. A small incision was made in the right upper chest over the anterior right second rib. Utilizing blunt dissection, a subcutaneous pocket was created in the caudal direction. The pocket was irrigated with a copious amount of sterile normal saline.  The port catheter was tunneled from the chest incision, and out the neck incision. The reservoir was inserted into the subcutaneous pocket and secured with two 3-0 Ethilon stitches. A peel-away sheath was advanced over the Amplatz wire. The port catheter was cut to measure length and inserted through the peel-away sheath. The peel-away sheath was removed. The chest incision was closed with 3-0 Vicryl interrupted stitches for the subcutaneous tissue and a running of 4-0 Vicryl subcuticular stitch for the skin. The neck incision was closed with a 4-0 Vicryl subcuticular stitch. Derma-bond was applied to both surgical incisions. The port reservoir was flushed and instilled with heparinized saline. No complications. FINDINGS: A right IJ vein Port-A-Cath is in place with its tip at the cavoatrial junction. COMPLICATIONS: None IMPRESSION: Successful 8 French right internal jugular vein power port placement with its tip at the SVC/RA junction. Electronically Signed   By: Marybelle Killings M.D.   On: 01/04/2017 14:38   Ir US Guide Bx Asp/drain  Result Date: 01/04/2017 INDICATION: Liver lesion EXAM: ULTRASOUND-GUIDED CORE BIOPSY OF THE LIVER LESION MEDICATIONS: None. ANESTHESIA/SEDATION: Moderate (conscious) sedation was employed during this procedure. A total of Versed to mg and Fentanyl 100 mcg was administered intravenously. Moderate Sedation Time: 10 minutes. The patient's level of consciousness and vital signs were monitored continuously by radiology nursing throughout the procedure under my direct supervision. FLUOROSCOPY TIME:  None COMPLICATIONS: None immediate. PROCEDURE: Informed written consent was obtained from the patient after a thorough discussion of the procedural risks, benefits and alternatives. All questions were addressed. Maximal Sterile Barrier Technique was utilized including caps, mask, sterile gowns, sterile gloves, sterile drape, hand hygiene and skin antiseptic. A timeout was performed prior to the  initiation of the procedure. The right flank was prepped and draped in a sterile fashion. 1% lidocaine was utilized for local anesthesia. Under sonographic guidance, a 17 gauge needle was advanced into the lesion within the right lobe of the liver. Three 18 gauge core specimens were obtained. Gel-Foam slurry was injected into the needle tract. FINDINGS: Images document needle placement in the lesion in the right lobe of the liver. Post biopsy images demonstrate no evidence of hemorrhage. IMPRESSION: Successful ultrasound-guided core biopsy of a lesion in the right lobe of the liver. Electronically Signed   By: Marybelle Killings M.D.   On: 01/04/2017 14:41   Ir Fluoro Guide Port Insertion Right  Result Date: 01/04/2017 CLINICAL DATA:  Esophageal mass EXAM: TUNNEL POWER PORT PLACEMENT WITH SUBCUTANEOUS POCKET UTILIZING ULTRASOUND & FLOUROSCOPY FLUOROSCOPY TIME:  18 seconds.  Eighteen mGy. MEDICATIONS AND MEDICAL HISTORY: Versed to mg, Fentanyl 100 mcg. Additional Medications: Ancef 2 g. Antibiotics were given within 2 hours of the procedure. ANESTHESIA/SEDATION: Moderate sedation time: 30 minutes. Nursing monitored the the patient during the procedure. PROCEDURE: After written informed consent was obtained, patient was placed in the supine position on angiographic table. The right neck and  chest was prepped and draped in a sterile fashion. Lidocaine was utilized for local anesthesia. The right jugular vein was noted to be patent initially with ultrasound. Under sonographic guidance, a micropuncture needle was inserted into the right IJ vein (Ultrasound and fluoroscopic image documentation was performed). The needle was removed over an 018 wire which was exchanged for a Amplatz. This was advanced into the IVC. An 8-French dilator was advanced over the Amplatz. A small incision was made in the right upper chest over the anterior right second rib. Utilizing blunt dissection, a subcutaneous pocket was created in the caudal  direction. The pocket was irrigated with a copious amount of sterile normal saline. The port catheter was tunneled from the chest incision, and out the neck incision. The reservoir was inserted into the subcutaneous pocket and secured with two 3-0 Ethilon stitches. A peel-away sheath was advanced over the Amplatz wire. The port catheter was cut to measure length and inserted through the peel-away sheath. The peel-away sheath was removed. The chest incision was closed with 3-0 Vicryl interrupted stitches for the subcutaneous tissue and a running of 4-0 Vicryl subcuticular stitch for the skin. The neck incision was closed with a 4-0 Vicryl subcuticular stitch. Derma-bond was applied to both surgical incisions. The port reservoir was flushed and instilled with heparinized saline. No complications. FINDINGS: A right IJ vein Port-A-Cath is in place with its tip at the cavoatrial junction. COMPLICATIONS: None IMPRESSION: Successful 8 French right internal jugular vein power port placement with its tip at the SVC/RA junction. Electronically Signed   By: Marybelle Killings M.D.   On: 01/04/2017 14:38    Medications: I have reviewed the patient's current medications.  Assessment/Plan: 1. Metastatic esophagus cancer ? Distal esophagus mass at upper endoscopy 12/15/2016, biopsy confirmed invasive poorly differentiated adenocarcinoma ? CTs of 02/16/2016-distal esophagus thickening, mediastinal/right hilar lymphadenopathy, abdominal/retroperitoneal lymphadenopathy, liver metastases ? MRI lumbar spine 12/20/2016-7 mm S1 lesion suspicious for metastasis, bulky retroperitoneal and prevertebral malignant lymphadenopathy, leftpsoasmuscle metastasis, degenerative disease causing severe neural foraminal stenosis at right L4 and left L5 ? PET scan 12/27/2016-hypermetabolic mass at the gastroesophageal junction; extensive hypermetabolic adenopathy involving the right supraclavicular nodal station, mediastinal lymph nodes,  retrocrural lymph nodes, periaortic lymph nodes, left iliac lymph nodes and central mesenteric lymph nodes; fine nodularity in the right upper lobe with hypermetabolic thickening in the right suprahilar peribronchial structures; hypermetabolic metastasis to the right hepatic lobe; several small skeletal metastasis including right iliac bone, left sacrum and left scapula; multiple soft tissue metastases to the subcutaneous tissues adjacent to the left scapular musculature, retroperitoneal nodal metastasis along the left psoas muscle and metastatic lesion within the left gluteal musculature.  No lesion in the lumbar spine or spinal canal to explain the patient's acute right-sided leg pain. ? Ultrasound-guided biopsy of a right liver lesion 01/04/2017 2. Pain possibly due to disc herniation-the pain may be related to retroperitoneal lymphadenopathy or bone metastases;plain x-ray right hip 12/21/2016 with no blastic or lytic bone lesions. No fracture or dislocation.  Review of the lumbar spine MRI shows right L4 disc herniation which could be responsible for the right leg pain and weakness.  Trial of dexamethasone initiated with improvement.  3.Rheumatoid arthritis  4.Dysphagia/weight loss secondary to #1  5.Chronic left pleural effusion  6.  Port-A-Cath placement 01/04/2017    Disposition:  Grant Todd appears unchanged.  He continues to have pain in the right leg, likely related to lumbar disc disease.  He is scheduled for a steroid injection by  neurosurgery.  He will continue Decadron until after the steroid injection.  He will then resume his chronic prednisone dose.  He has metastatic esophagus cancer.  The plan is to begin FOLFOX chemotherapy today.  He understands the increased risk of toxicity secondary to his poor performance status.  He would like to proceed.  We will arrange for an appointment with the Cancer center nutritionist.  He will contact us if his performance status has  not improved by early next week and we will arrange for an office visit.  He will be scheduled for an office visit and the next cycle of chemotherapy 01/18/2017.  30 minutes were spent with the patient today.  The majority of the time was used for counseling and coordination of care.  Betsy Coder, MD  01/05/2017  11:48 AM

## 2017-01-05 NOTE — Patient Instructions (Signed)
[facility] Discharge Instructions for Patients Receiving Chemotherapy  Today you received the following chemotherapy agents Oxaliplatin, 5FU, Leukovorin  To help prevent nausea and vomiting after your treatment, we encourage you to take your nausea medication as directed.  If you develop nausea and vomiting that is not controlled by your nausea medication, call the clinic.   BELOW ARE SYMPTOMS THAT SHOULD BE REPORTED IMMEDIATELY:  *FEVER GREATER THAN 100.5 F  *CHILLS WITH OR WITHOUT FEVER  NAUSEA AND VOMITING THAT IS NOT CONTROLLED WITH YOUR NAUSEA MEDICATION  *UNUSUAL SHORTNESS OF BREATH  *UNUSUAL BRUISING OR BLEEDING  TENDERNESS IN MOUTH AND THROAT WITH OR WITHOUT PRESENCE OF ULCERS  *URINARY PROBLEMS  *BOWEL PROBLEMS  UNUSUAL RASH Items with * indicate a potential emergency and should be followed up as soon as possible.  Feel free to call the clinic should you have any questions or concerns. The clinic phone number is [phone].  Please show the [facility] at check-in to the Emergency Department and triage nurse.

## 2017-01-05 NOTE — Progress Notes (Signed)
  Oncology Nurse Navigator Documentation  Navigator Location: [facility] (01/05/17 1323)   )Navigator Encounter Type: Treatment (01/05/17 1323)                   Treatment Initiated Date: 01/05/17 (01/05/17 1323)   Treatment Phase: First Chemo Tx (01/05/17 1323)     Interventions: Psycho-social support (01/05/17 1323)  Sat with patient and his wife during his first chemo therapy treatment.  Port care, signs and symptoms of infection reviewed/reinforced. Patient verbalized understanding that he can call with questions or concerns.          Acuity: Level 2 (01/05/17 1323)   Acuity Level 2: Ongoing guidance and education throughout treatment as needed;Educational needs (01/05/17 1323)     Time Spent with Patient: 30 (01/05/17 1323)

## 2017-01-06 ENCOUNTER — Ambulatory Visit (HOSPITAL_COMMUNITY): Payer: PRIVATE HEALTH INSURANCE

## 2017-01-06 ENCOUNTER — Telehealth: Payer: Self-pay | Admitting: *Deleted

## 2017-01-06 NOTE — Telephone Encounter (Signed)
Spoke with pt's wife, informed her of pathology result per MD note below. She voiced understanding. Noted pt is not scheduled for next cycle of chemo. Message to schedulers. Called [facility], spoke with Varney Biles. Requested [facility] One and PD1 testing on [phone].

## 2017-01-07 ENCOUNTER — Ambulatory Visit (HOSPITAL_BASED_OUTPATIENT_CLINIC_OR_DEPARTMENT_OTHER): Payer: PRIVATE HEALTH INSURANCE

## 2017-01-07 ENCOUNTER — Encounter: Payer: Self-pay | Admitting: Nurse Practitioner

## 2017-01-07 VITALS — BP 106/80 | HR 93 | Temp 98.4°F | Resp 16

## 2017-01-07 DIAGNOSIS — C155 Malignant neoplasm of lower third of esophagus: Secondary | ICD-10-CM

## 2017-01-07 MED ORDER — SODIUM CHLORIDE 0.9% FLUSH
10.0000 mL | INTRAVENOUS | Status: DC | PRN
  Administered 2017-01-07: 10 mL
  Filled 2017-01-07: qty 10

## 2017-01-07 MED ORDER — HEPARIN SOD (PORK) LOCK FLUSH 100 UNIT/ML IV SOLN
500.0000 [IU] | Freq: Once | INTRAVENOUS | Status: AC | PRN
  Administered 2017-01-07: 500 [IU]
  Filled 2017-01-07: qty 5

## 2017-01-07 NOTE — Patient Instructions (Signed)
[facility] Discharge Instructions for Patients Receiving Chemotherapy  Today you received the following chemotherapy agents 5FU home infusion.  To help prevent nausea and vomiting after your treatment, we encourage you to take your nausea medication Other as ordered by Dr. Benay Spice.   If you develop nausea and vomiting that is not controlled by your nausea medication, call the clinic.   BELOW ARE SYMPTOMS THAT SHOULD BE REPORTED IMMEDIATELY:  *FEVER GREATER THAN 100.5 F  *CHILLS WITH OR WITHOUT FEVER  NAUSEA AND VOMITING THAT IS NOT CONTROLLED WITH YOUR NAUSEA MEDICATION  *UNUSUAL SHORTNESS OF BREATH  *UNUSUAL BRUISING OR BLEEDING  TENDERNESS IN MOUTH AND THROAT WITH OR WITHOUT PRESENCE OF ULCERS  *URINARY PROBLEMS  *BOWEL PROBLEMS  UNUSUAL RASH Items with * indicate a potential emergency and should be followed up as soon as possible.  Feel free to call the clinic should you have any questions or concerns. The clinic phone number is [phone].  Please show the [facility] at check-in to the Emergency Department and triage nurse.

## 2017-01-10 ENCOUNTER — Telehealth: Payer: Self-pay | Admitting: Oncology

## 2017-01-10 NOTE — Telephone Encounter (Signed)
01/10/17 @ 11:58 am spoke with Gwynn Burly [phone]) regarding her FMLA paperwok informing her they were ready and needed a fax number to fax them to.  She requested they be faxed to Lexine Baton @ [phone].  They were successfully faxed @ 12:02 pm

## 2017-01-10 NOTE — Telephone Encounter (Signed)
01/10/17 @ 12:18 pm spoke with [facility] @ [phone] informing him his Disability paperwork was successfully faxed to [facility] @ [phone] @ 9:49 am.  He requested a copy be e-mailed to him @ [email or web address]

## 2017-01-11 ENCOUNTER — Ambulatory Visit: Payer: PRIVATE HEALTH INSURANCE

## 2017-01-12 ENCOUNTER — Encounter: Payer: Self-pay | Admitting: Oncology

## 2017-01-13 ENCOUNTER — Telehealth: Payer: Self-pay | Admitting: Nurse Practitioner

## 2017-01-13 NOTE — Telephone Encounter (Signed)
Called patient regarding 12/19 he was ok with spilt days

## 2017-01-17 ENCOUNTER — Encounter: Payer: PRIVATE HEALTH INSURANCE | Admitting: Nutrition

## 2017-01-18 ENCOUNTER — Other Ambulatory Visit (HOSPITAL_BASED_OUTPATIENT_CLINIC_OR_DEPARTMENT_OTHER): Payer: PRIVATE HEALTH INSURANCE

## 2017-01-18 ENCOUNTER — Telehealth: Payer: Self-pay | Admitting: Oncology

## 2017-01-18 ENCOUNTER — Ambulatory Visit (HOSPITAL_BASED_OUTPATIENT_CLINIC_OR_DEPARTMENT_OTHER): Payer: PRIVATE HEALTH INSURANCE

## 2017-01-18 ENCOUNTER — Encounter: Payer: Self-pay | Admitting: Nurse Practitioner

## 2017-01-18 ENCOUNTER — Other Ambulatory Visit: Payer: Self-pay | Admitting: Medical Oncology

## 2017-01-18 ENCOUNTER — Telehealth: Payer: Self-pay | Admitting: Medical Oncology

## 2017-01-18 ENCOUNTER — Ambulatory Visit (HOSPITAL_BASED_OUTPATIENT_CLINIC_OR_DEPARTMENT_OTHER): Payer: PRIVATE HEALTH INSURANCE | Admitting: Nurse Practitioner

## 2017-01-18 VITALS — BP 92/57 | HR 101 | Temp 97.6°F | Resp 18 | Ht 68.0 in | Wt 142.9 lb

## 2017-01-18 DIAGNOSIS — Z95828 Presence of other vascular implants and grafts: Secondary | ICD-10-CM

## 2017-01-18 DIAGNOSIS — C155 Malignant neoplasm of lower third of esophagus: Secondary | ICD-10-CM

## 2017-01-18 DIAGNOSIS — J90 Pleural effusion, not elsewhere classified: Secondary | ICD-10-CM | POA: Diagnosis not present

## 2017-01-18 DIAGNOSIS — C787 Secondary malignant neoplasm of liver and intrahepatic bile duct: Secondary | ICD-10-CM

## 2017-01-18 DIAGNOSIS — M069 Rheumatoid arthritis, unspecified: Secondary | ICD-10-CM | POA: Diagnosis not present

## 2017-01-18 LAB — COMPREHENSIVE METABOLIC PANEL
ALT: 21 U/L (ref 0–55)
AST: 23 U/L (ref 5–34)
Albumin: 2 g/dL — ABNORMAL LOW (ref 3.5–5.0)
Alkaline Phosphatase: 223 U/L — ABNORMAL HIGH (ref 40–150)
Anion Gap: 10 mEq/L (ref 3–11)
BUN: 20.5 mg/dL (ref 7.0–26.0)
CALCIUM: 8.3 mg/dL — AB (ref 8.4–10.4)
CHLORIDE: 103 meq/L (ref 98–109)
CO2: 25 meq/L (ref 22–29)
CREATININE: 0.7 mg/dL (ref 0.7–1.3)
EGFR: >60 mL/min/{1.73_m2} (ref >60)
Glucose: 104 mg/dl (ref 70–140)
Potassium: 3.6 mEq/L (ref 3.5–5.1)
Sodium: 137 mEq/L (ref 136–145)
TOTAL PROTEIN: 5.6 g/dL — AB (ref 6.4–8.3)
Total Bilirubin: 0.49 mg/dL (ref 0.20–1.20)

## 2017-01-18 LAB — CBC WITH DIFFERENTIAL/PLATELET
BASO%: 0.5 % (ref 0.0–2.0)
BASOS ABS: 0 10*3/uL (ref 0.0–0.1)
EOS ABS: 0.1 10*3/uL (ref 0.0–0.5)
EOS%: 1 % (ref 0.0–7.0)
HCT: 32.3 % — ABNORMAL LOW (ref 38.4–49.9)
HGB: 10.5 g/dL — ABNORMAL LOW (ref 13.0–17.1)
LYMPH%: 5.2 % — AB (ref 14.0–49.0)
MCH: 28.2 pg (ref 27.2–33.4)
MCHC: 32.3 g/dL (ref 32.0–36.0)
MCV: 87.1 fL (ref 79.3–98.0)
MONO#: 0.8 10*3/uL (ref 0.1–0.9)
MONO%: 7.6 % (ref 0.0–14.0)
NEUT#: 8.5 10*3/uL — ABNORMAL HIGH (ref 1.5–6.5)
NEUT%: 85.7 % — ABNORMAL HIGH (ref 39.0–75.0)
Platelets: 230 10*3/uL (ref 140–400)
RBC: 3.71 10*6/uL — AB (ref 4.20–5.82)
RDW: 17.5 % — ABNORMAL HIGH (ref 11.0–14.6)
WBC: 9.9 10*3/uL (ref 4.0–10.3)
lymph#: 0.5 10*3/uL — ABNORMAL LOW (ref 0.9–3.3)

## 2017-01-18 MED ORDER — SODIUM CHLORIDE 0.9% FLUSH
10.0000 mL | Freq: Once | INTRAVENOUS | Status: AC
  Administered 2017-01-18: 10 mL
  Filled 2017-01-18: qty 10

## 2017-01-18 MED ORDER — FLUCONAZOLE 100 MG PO TABS: 100.0000 mg | ORAL_TABLET | Freq: Every day | ORAL | 0 refills | Status: DC

## 2017-01-18 MED ORDER — LIDOCAINE-PRILOCAINE 2.5-2.5 % EX CREA: 1.0000 "application " | TOPICAL_CREAM | CUTANEOUS | 0 refills | Status: AC | PRN

## 2017-01-18 MED ORDER — HEPARIN SOD (PORK) LOCK FLUSH 100 UNIT/ML IV SOLN
500.0000 [IU] | Freq: Once | INTRAVENOUS | Status: AC
  Administered 2017-01-18: 500 [IU]
  Filled 2017-01-18: qty 5

## 2017-01-18 NOTE — Telephone Encounter (Signed)
Gave avs and calendar for december and january

## 2017-01-18 NOTE — Telephone Encounter (Signed)
emla cream called to [facility]

## 2017-01-18 NOTE — Progress Notes (Addendum)
[facility] OFFICE PROGRESS NOTE   Diagnosis: Esophagus cancer  INTERVAL HISTORY:   Mr. Bose returns as scheduled.  He completed cycle 1 FOLFOX 01/05/2017.  He denies nausea/vomiting.  No mouth sores.  No diarrhea.  No cold sensitivity.  He continues to have back pain.  He had an injection which "did not help".  He has resumed hydrocodone.  He reports the dysphagia is better.  He estimates fluid intake over the past 24 hours at approximately 30 ounces.  He denies lightheadedness and dizziness.  Objective:  Vital signs in last 24 hours:  Blood pressure (!) 92/57, pulse (!) 101, temperature 97.6 F (36.4 C), temperature source Oral, resp. rate 18, height '5\' 8"'$  (1.727 m), weight 142 lb 14.4 oz (64.8 kg), SpO2 98 %.    HEENT: White patches over buccal mucosa. Resp: Lungs clear bilaterally. Cardio: Regular rate and rhythm. GI: Abdomen soft and nontender.  No hepatomegaly. Vascular: No leg edema. Port-A-Cath without erythema.  Lab Results:  Lab Results  Component Value Date   WBC 9.9 01/18/2017   HGB 10.5 (L) 01/18/2017   HCT 32.3 (L) 01/18/2017   MCV 87.1 01/18/2017   PLT 230 01/18/2017   NEUTROABS 8.5 (H) 01/18/2017    Imaging:  No results found.  Medications: I have reviewed the patient's current medications.  Assessment/Plan: 1. Metastatic esophagus cancer ? Distal esophagus mass at upper endoscopy 12/15/2016, biopsy confirmed invasive poorly differentiated adenocarcinoma ? CTs of 02/16/2016-distal esophagus thickening, mediastinal/right hilar lymphadenopathy, abdominal/retroperitoneal lymphadenopathy, liver metastases ? MRI lumbar spine 12/20/2016-7 mm S1 lesion suspicious for metastasis, bulky retroperitoneal and prevertebral malignant lymphadenopathy, leftpsoasmuscle metastasis, degenerative disease causing severe neural foraminal stenosis at right L4 and left L5 ? PET scan 12/27/2016-hypermetabolic mass at the gastroesophageal junction; extensive  hypermetabolic adenopathy involving the right supraclavicular nodal station, mediastinal lymph nodes, retrocrural lymph nodes, periaortic lymph nodes, left iliac lymph nodes and central mesenteric lymph nodes; fine nodularity in the right upper lobe with hypermetabolic thickening in the right suprahilar peribronchial structures; hypermetabolic metastasis to the right hepatic lobe; several small skeletal metastasis including right iliac bone, left sacrum and left scapula; multiple soft tissue metastases to the subcutaneous tissues adjacent to the left scapular musculature, retroperitoneal nodal metastasis along the left psoas muscle and metastatic lesion within the left gluteal musculature. No lesion in the lumbar spine or spinal canal to explain the patient's acute right-sided leg pain. ? Ultrasound-guided biopsy of a right liver lesion 01/04/2017-poorly differentiated adenocarcinoma ? PD-L1 expression 30% ? Cycle 1 FOLFOX 01/05/2017 ? Cycle 2 FOLFOX 01/19/2017  2. Painpossibly due to disc herniation-the pain may be related to retroperitoneal lymphadenopathy or bone metastases;plain x-ray right hip 12/21/2016 with no blastic or lytic bone lesions. No fracture or dislocation.Review of the lumbar spine MRI shows right L4 disc herniation which could be responsible for the right leg pain and weakness. Trial of dexamethasone initiatedwith improvement.  3.Rheumatoid arthritis  4.Dysphagia/weight loss secondary to #1  5.Chronic left pleural effusion  6.  Port-A-Cath placement 01/04/2017     Disposition: Mr. Schmid appears unchanged.  He has completed 1 cycle of FOLFOX which overall he seems to have tolerated well.  He continues to have a borderline performance status with continued weight loss.  Plan to proceed with cycle 2 FOLFOX as scheduled 01/19/2017 with dose reductions of the chemotherapy based on the weight loss.  He is orthostatic in the office today.  He is not symptomatic.  He  will increase fluid intake.  We will obtain  repeat orthostatic vital signs 01/19/2017.  We will see him in follow-up prior to cycle 3 FOLFOX in 2 weeks.  He will contact the office in the interim with any problems.  Patient seen with Dr. Benay Spice.  25 minutes were spent face-to-face at today's visit with the majority of that time involved in counseling/coordination of care.    Ned Card ANP/GNP-BC   01/18/2017  10:55 AM This was a shared visit with Ned Card.  Mr. Stage tolerated the first cycle of FOLFOX well.  His pain has improved, but he continues to have pain at the lower back and right leg, likely related to lumbar disc disease.  We encouraged him to increase his fluid intake and diet as tolerated.  He will complete cycle 2 FOLFOX 01/19/2017.  Julieanne Manson, MD

## 2017-01-19 ENCOUNTER — Ambulatory Visit (HOSPITAL_BASED_OUTPATIENT_CLINIC_OR_DEPARTMENT_OTHER): Payer: PRIVATE HEALTH INSURANCE

## 2017-01-19 VITALS — BP 99/74 | HR 92 | Temp 97.0°F | Resp 18

## 2017-01-19 DIAGNOSIS — C155 Malignant neoplasm of lower third of esophagus: Secondary | ICD-10-CM | POA: Diagnosis not present

## 2017-01-19 DIAGNOSIS — Z5111 Encounter for antineoplastic chemotherapy: Secondary | ICD-10-CM

## 2017-01-19 MED ORDER — SODIUM CHLORIDE 0.9 % IV SOLN
2400.0000 mg/m2 | INTRAVENOUS | Status: DC
  Administered 2017-01-19: 4200 mg via INTRAVENOUS
  Filled 2017-01-19: qty 84

## 2017-01-19 MED ORDER — PALONOSETRON HCL INJECTION 0.25 MG/5ML
INTRAVENOUS | Status: AC
  Filled 2017-01-19: qty 5

## 2017-01-19 MED ORDER — OXALIPLATIN CHEMO INJECTION 100 MG/20ML
85.0000 mg/m2 | Freq: Once | INTRAVENOUS | Status: AC
  Administered 2017-01-19: 150 mg via INTRAVENOUS
  Filled 2017-01-19: qty 20

## 2017-01-19 MED ORDER — DEXAMETHASONE SODIUM PHOSPHATE 10 MG/ML IJ SOLN
INTRAMUSCULAR | Status: AC
  Filled 2017-01-19: qty 1

## 2017-01-19 MED ORDER — DEXAMETHASONE SODIUM PHOSPHATE 10 MG/ML IJ SOLN
10.0000 mg | Freq: Once | INTRAMUSCULAR | Status: AC
  Administered 2017-01-19: 10 mg via INTRAVENOUS

## 2017-01-19 MED ORDER — LEUCOVORIN CALCIUM INJECTION 350 MG
400.0000 mg/m2 | Freq: Once | INTRAVENOUS | Status: AC
  Administered 2017-01-19: 704 mg via INTRAVENOUS
  Filled 2017-01-19: qty 35.2

## 2017-01-19 MED ORDER — DEXTROSE 5 % IV SOLN
Freq: Once | INTRAVENOUS | Status: AC
  Administered 2017-01-19: 14:00:00 via INTRAVENOUS

## 2017-01-19 MED ORDER — PALONOSETRON HCL INJECTION 0.25 MG/5ML
0.2500 mg | Freq: Once | INTRAVENOUS | Status: AC
  Administered 2017-01-19: 0.25 mg via INTRAVENOUS

## 2017-01-19 NOTE — Progress Notes (Signed)
Per Dr. Benay Spice okay, to waste remaining adrucil on pump D/C at 1pm 01/21/17.

## 2017-01-19 NOTE — Patient Instructions (Signed)
[facility] Discharge Instructions for Patients Receiving Chemotherapy  Today you received the following chemotherapy agents Oxaliplatin, Leucovorin, Adrucil  To help prevent nausea and vomiting after your treatment, we encourage you to take your nausea medication as directed   If you develop nausea and vomiting that is not controlled by your nausea medication, call the clinic.   BELOW ARE SYMPTOMS THAT SHOULD BE REPORTED IMMEDIATELY:  *FEVER GREATER THAN 100.5 F  *CHILLS WITH OR WITHOUT FEVER  NAUSEA AND VOMITING THAT IS NOT CONTROLLED WITH YOUR NAUSEA MEDICATION  *UNUSUAL SHORTNESS OF BREATH  *UNUSUAL BRUISING OR BLEEDING  TENDERNESS IN MOUTH AND THROAT WITH OR WITHOUT PRESENCE OF ULCERS  *URINARY PROBLEMS  *BOWEL PROBLEMS  UNUSUAL RASH Items with * indicate a potential emergency and should be followed up as soon as possible.  Feel free to call the clinic should you have any questions or concerns. The clinic phone number is [phone].  Please show the CHEMO ALERT CARD at check-in to the Emergency Department and triage nurse.   

## 2017-01-21 ENCOUNTER — Ambulatory Visit (HOSPITAL_BASED_OUTPATIENT_CLINIC_OR_DEPARTMENT_OTHER): Payer: PRIVATE HEALTH INSURANCE

## 2017-01-21 VITALS — BP 118/77 | HR 86 | Temp 98.0°F | Resp 18

## 2017-01-21 DIAGNOSIS — C155 Malignant neoplasm of lower third of esophagus: Secondary | ICD-10-CM

## 2017-01-21 MED ORDER — HEPARIN SOD (PORK) LOCK FLUSH 100 UNIT/ML IV SOLN
500.0000 [IU] | Freq: Once | INTRAVENOUS | Status: AC | PRN
  Administered 2017-01-21: 500 [IU]
  Filled 2017-01-21: qty 5

## 2017-01-21 MED ORDER — SODIUM CHLORIDE 0.9% FLUSH
10.0000 mL | INTRAVENOUS | Status: DC | PRN
  Administered 2017-01-21: 10 mL
  Filled 2017-01-21: qty 10

## 2017-01-29 ENCOUNTER — Other Ambulatory Visit: Payer: Self-pay | Admitting: Oncology

## 2017-01-30 ENCOUNTER — Other Ambulatory Visit: Payer: Self-pay | Admitting: Nurse Practitioner

## 2017-01-30 DIAGNOSIS — C155 Malignant neoplasm of lower third of esophagus: Secondary | ICD-10-CM

## 2017-02-01 ENCOUNTER — Inpatient Hospital Stay: Payer: No Typology Code available for payment source | Attending: Nurse Practitioner | Admitting: Nurse Practitioner

## 2017-02-01 ENCOUNTER — Ambulatory Visit: Payer: PRIVATE HEALTH INSURANCE | Admitting: Nutrition

## 2017-02-01 ENCOUNTER — Ambulatory Visit (HOSPITAL_BASED_OUTPATIENT_CLINIC_OR_DEPARTMENT_OTHER): Payer: PRIVATE HEALTH INSURANCE

## 2017-02-01 ENCOUNTER — Encounter: Payer: Self-pay | Admitting: Nurse Practitioner

## 2017-02-01 ENCOUNTER — Other Ambulatory Visit (HOSPITAL_BASED_OUTPATIENT_CLINIC_OR_DEPARTMENT_OTHER): Payer: PRIVATE HEALTH INSURANCE

## 2017-02-01 ENCOUNTER — Ambulatory Visit: Payer: PRIVATE HEALTH INSURANCE

## 2017-02-01 VITALS — HR 80

## 2017-02-01 VITALS — BP 92/60 | HR 103 | Temp 97.7°F | Resp 18 | Ht 68.0 in | Wt 148.0 lb

## 2017-02-01 DIAGNOSIS — C155 Malignant neoplasm of lower third of esophagus: Secondary | ICD-10-CM

## 2017-02-01 DIAGNOSIS — Z9221 Personal history of antineoplastic chemotherapy: Secondary | ICD-10-CM | POA: Insufficient documentation

## 2017-02-01 DIAGNOSIS — R634 Abnormal weight loss: Secondary | ICD-10-CM | POA: Insufficient documentation

## 2017-02-01 DIAGNOSIS — M5126 Other intervertebral disc displacement, lumbar region: Secondary | ICD-10-CM | POA: Insufficient documentation

## 2017-02-01 DIAGNOSIS — M549 Dorsalgia, unspecified: Secondary | ICD-10-CM | POA: Insufficient documentation

## 2017-02-01 DIAGNOSIS — Z5111 Encounter for antineoplastic chemotherapy: Secondary | ICD-10-CM | POA: Insufficient documentation

## 2017-02-01 DIAGNOSIS — C787 Secondary malignant neoplasm of liver and intrahepatic bile duct: Secondary | ICD-10-CM | POA: Insufficient documentation

## 2017-02-01 DIAGNOSIS — J90 Pleural effusion, not elsewhere classified: Secondary | ICD-10-CM

## 2017-02-01 DIAGNOSIS — R5383 Other fatigue: Secondary | ICD-10-CM | POA: Insufficient documentation

## 2017-02-01 DIAGNOSIS — C779 Secondary and unspecified malignant neoplasm of lymph node, unspecified: Secondary | ICD-10-CM | POA: Insufficient documentation

## 2017-02-01 DIAGNOSIS — M069 Rheumatoid arthritis, unspecified: Secondary | ICD-10-CM | POA: Insufficient documentation

## 2017-02-01 DIAGNOSIS — R1310 Dysphagia, unspecified: Secondary | ICD-10-CM | POA: Diagnosis not present

## 2017-02-01 DIAGNOSIS — Z95828 Presence of other vascular implants and grafts: Secondary | ICD-10-CM

## 2017-02-01 LAB — COMPREHENSIVE METABOLIC PANEL
ALT: 41 U/L (ref 0–55)
AST: 31 U/L (ref 5–34)
Albumin: 2.3 g/dL — ABNORMAL LOW (ref 3.5–5.0)
Alkaline Phosphatase: 308 U/L — ABNORMAL HIGH (ref 40–150)
Anion Gap: 9 mEq/L (ref 3–11)
BILIRUBIN TOTAL: 0.23 mg/dL (ref 0.20–1.20)
BUN: 18.3 mg/dL (ref 7.0–26.0)
CHLORIDE: 105 meq/L (ref 98–109)
CO2: 25 mEq/L (ref 22–29)
CREATININE: 0.7 mg/dL (ref 0.7–1.3)
Calcium: 8.8 mg/dL (ref 8.4–10.4)
EGFR: >60 mL/min/{1.73_m2} (ref >60)
GLUCOSE: 102 mg/dL (ref 70–140)
Potassium: 3.6 mEq/L (ref 3.5–5.1)
SODIUM: 139 meq/L (ref 136–145)
TOTAL PROTEIN: 6 g/dL — AB (ref 6.4–8.3)

## 2017-02-01 LAB — CBC WITH DIFFERENTIAL/PLATELET
BASO%: 0.9 % (ref 0.0–2.0)
Basophils Absolute: 0.1 10*3/uL (ref 0.0–0.1)
EOS ABS: 0.1 10*3/uL (ref 0.0–0.5)
EOS%: 0.6 % (ref 0.0–7.0)
HEMATOCRIT: 29.9 % — AB (ref 38.4–49.9)
HEMOGLOBIN: 9.8 g/dL — AB (ref 13.0–17.1)
LYMPH%: 6 % — ABNORMAL LOW (ref 14.0–49.0)
MCH: 28.3 pg (ref 27.2–33.4)
MCHC: 32.7 g/dL (ref 32.0–36.0)
MCV: 86.6 fL (ref 79.3–98.0)
MONO#: 0.7 10*3/uL (ref 0.1–0.9)
MONO%: 8.3 % (ref 0.0–14.0)
NEUT#: 7.3 10*3/uL — ABNORMAL HIGH (ref 1.5–6.5)
NEUT%: 84.2 % — AB (ref 39.0–75.0)
Platelets: 325 10*3/uL (ref 140–400)
RBC: 3.45 10*6/uL — ABNORMAL LOW (ref 4.20–5.82)
RDW: 18.7 % — ABNORMAL HIGH (ref 11.0–14.6)
WBC: 8.7 10*3/uL (ref 4.0–10.3)
lymph#: 0.5 10*3/uL — ABNORMAL LOW (ref 0.9–3.3)

## 2017-02-01 MED ORDER — LEUCOVORIN CALCIUM INJECTION 350 MG
400.0000 mg/m2 | Freq: Once | INTRAVENOUS | Status: AC
  Administered 2017-02-01: 704 mg via INTRAVENOUS
  Filled 2017-02-01: qty 35.2

## 2017-02-01 MED ORDER — SODIUM CHLORIDE 0.9% FLUSH
10.0000 mL | Freq: Once | INTRAVENOUS | Status: AC
  Administered 2017-02-01: 10 mL
  Filled 2017-02-01: qty 10

## 2017-02-01 MED ORDER — DEXAMETHASONE SODIUM PHOSPHATE 10 MG/ML IJ SOLN
10.0000 mg | Freq: Once | INTRAMUSCULAR | Status: AC
  Administered 2017-02-01: 10 mg via INTRAVENOUS

## 2017-02-01 MED ORDER — DEXAMETHASONE SODIUM PHOSPHATE 10 MG/ML IJ SOLN
INTRAMUSCULAR | Status: AC
  Filled 2017-02-01: qty 1

## 2017-02-01 MED ORDER — PALONOSETRON HCL INJECTION 0.25 MG/5ML
INTRAVENOUS | Status: AC
  Filled 2017-02-01: qty 5

## 2017-02-01 MED ORDER — DEXTROSE 5 % IV SOLN
Freq: Once | INTRAVENOUS | Status: AC
  Administered 2017-02-01: 10:00:00 via INTRAVENOUS

## 2017-02-01 MED ORDER — PALONOSETRON HCL INJECTION 0.25 MG/5ML
0.2500 mg | Freq: Once | INTRAVENOUS | Status: AC
  Administered 2017-02-01: 0.25 mg via INTRAVENOUS

## 2017-02-01 MED ORDER — OXALIPLATIN CHEMO INJECTION 100 MG/20ML
85.0000 mg/m2 | Freq: Once | INTRAVENOUS | Status: AC
  Administered 2017-02-01: 150 mg via INTRAVENOUS
  Filled 2017-02-01: qty 20

## 2017-02-01 MED ORDER — SODIUM CHLORIDE 0.9 % IV SOLN
2400.0000 mg/m2 | INTRAVENOUS | Status: DC
  Administered 2017-02-01: 4200 mg via INTRAVENOUS
  Filled 2017-02-01: qty 84

## 2017-02-01 NOTE — Progress Notes (Signed)
  [facility] OFFICE PROGRESS NOTE   Diagnosis: Esophagus cancer  INTERVAL HISTORY:   Grant Todd returns as scheduled.  He completed cycle 2 FOLFOX 01/19/2017.  He denies nausea/vomiting.  He had a single mouth sore which has resolved.  No cold sensitivity.  No significant diarrhea.  Dysphagia is better.  He reports a good appetite and weight gain.  Main complaint continues to be back pain.  Hydrocodone provides partial relief.  Objective:  Vital signs in last 24 hours:  Blood pressure 92/60, pulse (!) 103, temperature 97.7 F (36.5 C), temperature source Oral, resp. rate 18, height '5\' 8"'$  (1.727 m), weight 148 lb (67.1 kg), SpO2 100 %.    HEENT: No thrush or ulcers. Resp: Lungs clear bilaterally. Cardio: Regular rate and rhythm. GI: Abdomen soft and nontender.  No hepatomegaly. Vascular: No leg edema.  Calves soft and nontender. Skin: Palms without erythema. Port-A-Cath without erythema.   Lab Results:  Lab Results  Component Value Date   WBC 8.7 02/01/2017   HGB 9.8 (L) 02/01/2017   HCT 29.9 (L) 02/01/2017   MCV 86.6 02/01/2017   PLT 325 02/01/2017   NEUTROABS 7.3 (H) 02/01/2017    Imaging:  No results found.  Medications: I have reviewed the patient's current medications.  Assessment/Plan: 1. Metastatic esophagus cancer ? Distal esophagus mass at upper endoscopy 12/15/2016, biopsy confirmed invasive poorly differentiated adenocarcinoma ? CTs of 02/16/2016-distal esophagus thickening, mediastinal/right hilar lymphadenopathy, abdominal/retroperitoneal lymphadenopathy, liver metastases ? MRI lumbar spine 12/20/2016-7 mm S1 lesion suspicious for metastasis, bulky retroperitoneal and prevertebral malignant lymphadenopathy, leftpsoasmuscle metastasis, degenerative disease causing severe neural foraminal stenosis at right L4 and left L5 ? PET scan 12/27/2016-hypermetabolic mass at the gastroesophageal junction; extensive hypermetabolic adenopathy involving  the right supraclavicular nodal station, mediastinal lymph nodes, retrocrural lymph nodes, periaortic lymph nodes, left iliac lymph nodes and central mesenteric lymph nodes; fine nodularity in the right upper lobe with hypermetabolic thickening in the right suprahilar peribronchial structures; hypermetabolic metastasis to the right hepatic lobe; several small skeletal metastasis including right iliac bone, left sacrum and left scapula; multiple soft tissue metastases to the subcutaneous tissues adjacent to the left scapular musculature, retroperitoneal nodal metastasis along the left psoas muscle and metastatic lesion within the left gluteal musculature. No lesion in the lumbar spine or spinal canal to explain the patient's acute right-sided leg pain. ? Ultrasound-guided biopsy of a right liver lesion 01/04/2017-poorly differentiated adenocarcinoma ? PD-L1 expression 30% ? Cycle 1 FOLFOX 01/05/2017 ? Cycle 2 FOLFOX 01/19/2017 ? Cycle 3 FOLFOX 02/01/2017  2. Painpossibly due to disc herniation-the pain may be related to retroperitoneal lymphadenopathy or bone metastases;plain x-ray right hip 12/21/2016 with no blastic or lytic bone lesions. No fracture or dislocation.Review of the lumbar spine MRI shows right L4 disc herniation which could be responsible for the right leg pain and weakness. Trial of dexamethasone initiatedwith improvement.  3.Rheumatoid arthritis  4.Dysphagia/weight loss secondary to #1-improved 02/01/2017.  5.Chronic left pleural effusion  6.Port-A-Cath placement 01/04/2017     Disposition: Grant Todd appears improved.  He has completed 2 cycles of FOLFOX.  Plan to proceed with cycle 3 today as scheduled.  We will repeat the CEA when he returns in 2 weeks.  He will return for a follow-up visit and cycle 4 FOLFOX on 02/15/2017.  He will contact the office in the interim with any problems.    Ned Card ANP/GNP-BC   02/01/2017  9:48 AM

## 2017-02-01 NOTE — Patient Instructions (Signed)
[facility] Discharge Instructions for Patients Receiving Chemotherapy  Today you received the following chemotherapy agents: Oxaliplatin (Eloxatin) and Leucovorin.   To help prevent nausea and vomiting after your treatment, we encourage you to take your nausea medication as prescribed. Received Aloxi during treatment-->take Compazine for next 3 days.  If you develop nausea and vomiting that is not controlled by your nausea medication, call the clinic.   BELOW ARE SYMPTOMS THAT SHOULD BE REPORTED IMMEDIATELY:  *FEVER GREATER THAN 100.5 F  *CHILLS WITH OR WITHOUT FEVER  NAUSEA AND VOMITING THAT IS NOT CONTROLLED WITH YOUR NAUSEA MEDICATION  *UNUSUAL SHORTNESS OF BREATH  *UNUSUAL BRUISING OR BLEEDING  TENDERNESS IN MOUTH AND THROAT WITH OR WITHOUT PRESENCE OF ULCERS  *URINARY PROBLEMS  *BOWEL PROBLEMS  UNUSUAL RASH Items with * indicate a potential emergency and should be followed up as soon as possible.  Feel free to call the clinic should you have any questions or concerns. The clinic phone number is [phone].  Please show the [facility] at check-in to the Emergency Department and triage nurse.

## 2017-02-01 NOTE — Addendum Note (Signed)
Addended by: Purcell Nails on: 02/01/2017 10:36 AM   Modules accepted: Orders

## 2017-02-01 NOTE — Progress Notes (Signed)
Nutrition follow-up completed with patient receiving treatment for metastatic cancer of the esophagus. Patient reports he is eating better and feels weight loss was because of back pain. Current weight increased and documented as 148 pounds January 2 increased from 142.9 pounds December 19 Patient does not require oral nutrition supplements at this time. He denies nutrition impact symptoms.  Nutrition diagnosis: Unintentional weight loss improved.  Intervention:  Patient educated on small frequent meals with adequate calories and protein for weight maintenance. Encouraged patient to add oral nutrition supplements if he is unable to eat maintain his weight. Questions answered.  Teach back method used.  Monitoring, evaluation, goals: Patient will tolerate increased oral intake to promote weight gain/weight maintenance.  Next visit: No follow-up scheduled.  Patient has my contact information if he develops questions.  **Disclaimer: This note was dictated with voice recognition software. Similar sounding words can inadvertently be transcribed and this note may contain transcription errors which may not have been corrected upon publication of note.**

## 2017-02-03 ENCOUNTER — Ambulatory Visit (HOSPITAL_BASED_OUTPATIENT_CLINIC_OR_DEPARTMENT_OTHER): Payer: PRIVATE HEALTH INSURANCE

## 2017-02-03 VITALS — BP 109/70 | HR 88 | Temp 97.8°F | Resp 18

## 2017-02-03 DIAGNOSIS — Z452 Encounter for adjustment and management of vascular access device: Secondary | ICD-10-CM

## 2017-02-03 DIAGNOSIS — C155 Malignant neoplasm of lower third of esophagus: Secondary | ICD-10-CM

## 2017-02-03 MED ORDER — HEPARIN SOD (PORK) LOCK FLUSH 100 UNIT/ML IV SOLN
500.0000 [IU] | Freq: Once | INTRAVENOUS | Status: AC | PRN
  Administered 2017-02-03: 500 [IU]
  Filled 2017-02-03: qty 5

## 2017-02-03 MED ORDER — SODIUM CHLORIDE 0.9% FLUSH
10.0000 mL | INTRAVENOUS | Status: DC | PRN
  Administered 2017-02-03: 10 mL
  Filled 2017-02-03: qty 10

## 2017-02-07 ENCOUNTER — Encounter (HOSPITAL_COMMUNITY): Payer: Self-pay

## 2017-02-08 ENCOUNTER — Telehealth: Payer: Self-pay | Admitting: Oncology

## 2017-02-08 NOTE — Telephone Encounter (Signed)
Mailed patient calendar of upcoming January appointments.

## 2017-02-12 ENCOUNTER — Other Ambulatory Visit: Payer: Self-pay | Admitting: Oncology

## 2017-02-15 ENCOUNTER — Inpatient Hospital Stay (HOSPITAL_BASED_OUTPATIENT_CLINIC_OR_DEPARTMENT_OTHER): Payer: No Typology Code available for payment source | Admitting: Oncology

## 2017-02-15 ENCOUNTER — Encounter: Payer: Self-pay | Admitting: Oncology

## 2017-02-15 ENCOUNTER — Inpatient Hospital Stay: Payer: No Typology Code available for payment source

## 2017-02-15 ENCOUNTER — Telehealth: Payer: Self-pay | Admitting: Oncology

## 2017-02-15 VITALS — BP 112/75 | HR 100 | Temp 98.2°F | Resp 18 | Ht 68.0 in | Wt 153.0 lb

## 2017-02-15 DIAGNOSIS — C155 Malignant neoplasm of lower third of esophagus: Secondary | ICD-10-CM

## 2017-02-15 DIAGNOSIS — Z9221 Personal history of antineoplastic chemotherapy: Secondary | ICD-10-CM | POA: Diagnosis not present

## 2017-02-15 DIAGNOSIS — J90 Pleural effusion, not elsewhere classified: Secondary | ICD-10-CM | POA: Diagnosis not present

## 2017-02-15 DIAGNOSIS — C787 Secondary malignant neoplasm of liver and intrahepatic bile duct: Secondary | ICD-10-CM

## 2017-02-15 DIAGNOSIS — R5383 Other fatigue: Secondary | ICD-10-CM | POA: Diagnosis not present

## 2017-02-15 DIAGNOSIS — R634 Abnormal weight loss: Secondary | ICD-10-CM | POA: Diagnosis not present

## 2017-02-15 DIAGNOSIS — M5126 Other intervertebral disc displacement, lumbar region: Secondary | ICD-10-CM | POA: Diagnosis not present

## 2017-02-15 DIAGNOSIS — Z5111 Encounter for antineoplastic chemotherapy: Secondary | ICD-10-CM | POA: Diagnosis present

## 2017-02-15 DIAGNOSIS — R1310 Dysphagia, unspecified: Secondary | ICD-10-CM | POA: Diagnosis not present

## 2017-02-15 DIAGNOSIS — Z95828 Presence of other vascular implants and grafts: Secondary | ICD-10-CM

## 2017-02-15 DIAGNOSIS — M549 Dorsalgia, unspecified: Secondary | ICD-10-CM

## 2017-02-15 DIAGNOSIS — M069 Rheumatoid arthritis, unspecified: Secondary | ICD-10-CM | POA: Diagnosis not present

## 2017-02-15 DIAGNOSIS — C779 Secondary and unspecified malignant neoplasm of lymph node, unspecified: Secondary | ICD-10-CM | POA: Diagnosis not present

## 2017-02-15 LAB — COMPREHENSIVE METABOLIC PANEL
ALBUMIN: 2.7 g/dL — AB (ref 3.5–5.0)
ALK PHOS: 234 U/L — AB (ref 40–150)
ALT: 33 U/L (ref 0–55)
ANION GAP: 9 (ref 3–11)
AST: 34 U/L (ref 5–34)
BILIRUBIN TOTAL: 0.3 mg/dL (ref 0.2–1.2)
BUN: 16 mg/dL (ref 7–26)
CO2: 25 mmol/L (ref 22–29)
Calcium: 9.5 mg/dL (ref 8.4–10.4)
Chloride: 106 mmol/L (ref 98–109)
Creatinine, Ser: 0.76 mg/dL (ref 0.70–1.30)
GFR calc Af Amer: >60 mL/min (ref >60)
GFR calc non Af Amer: >60 mL/min (ref >60)
GLUCOSE: 100 mg/dL (ref 70–140)
Potassium: 3.9 mmol/L (ref 3.5–5.1)
SODIUM: 140 mmol/L (ref 136–145)
TOTAL PROTEIN: 6.3 g/dL — AB (ref 6.4–8.3)

## 2017-02-15 LAB — CBC WITH DIFFERENTIAL/PLATELET
Basophils Absolute: 0 10*3/uL (ref 0.0–0.1)
Basophils Relative: 0 %
Eosinophils Absolute: 0.2 10*3/uL (ref 0.0–0.5)
Eosinophils Relative: 2 %
HEMATOCRIT: 33.4 % — AB (ref 38.4–49.9)
Hemoglobin: 10.2 g/dL — ABNORMAL LOW (ref 13.0–17.1)
LYMPHS PCT: 17 %
Lymphs Abs: 1.7 10*3/uL (ref 0.9–3.3)
MCH: 27.6 pg (ref 27.2–33.4)
MCHC: 30.5 g/dL — AB (ref 32.0–36.0)
MCV: 90.3 fL (ref 79.3–98.0)
MONO ABS: 1 10*3/uL — AB (ref 0.1–0.9)
MONOS PCT: 10 %
NEUTROS ABS: 7.1 10*3/uL — AB (ref 1.5–6.5)
Neutrophils Relative %: 71 %
Platelets: 267 10*3/uL (ref 140–400)
RBC: 3.7 MIL/uL — ABNORMAL LOW (ref 4.20–5.82)
RDW: 20.5 % — AB (ref 11.0–15.6)
WBC: 10 10*3/uL (ref 4.0–10.3)

## 2017-02-15 LAB — CEA (IN HOUSE-CHCC): CEA (CHCC-In House): 1397.97 ng/mL — ABNORMAL HIGH (ref 0.00–5.00)

## 2017-02-15 MED ORDER — PALONOSETRON HCL INJECTION 0.25 MG/5ML
INTRAVENOUS | Status: AC
  Filled 2017-02-15: qty 5

## 2017-02-15 MED ORDER — DEXAMETHASONE SODIUM PHOSPHATE 10 MG/ML IJ SOLN
10.0000 mg | Freq: Once | INTRAMUSCULAR | Status: AC
  Administered 2017-02-15: 10 mg via INTRAVENOUS

## 2017-02-15 MED ORDER — PALONOSETRON HCL INJECTION 0.25 MG/5ML
0.2500 mg | Freq: Once | INTRAVENOUS | Status: AC
  Administered 2017-02-15: 0.25 mg via INTRAVENOUS

## 2017-02-15 MED ORDER — SODIUM CHLORIDE 0.9 % IV SOLN
2400.0000 mg/m2 | INTRAVENOUS | Status: DC
  Administered 2017-02-15: 4200 mg via INTRAVENOUS
  Filled 2017-02-15: qty 84

## 2017-02-15 MED ORDER — FLUOROURACIL CHEMO INJECTION 2.5 GM/50ML: 400.0000 mg/m2 | Freq: Once | INTRAVENOUS | Status: DC

## 2017-02-15 MED ORDER — OXALIPLATIN CHEMO INJECTION 100 MG/20ML
85.0000 mg/m2 | Freq: Once | INTRAVENOUS | Status: AC
  Administered 2017-02-15: 150 mg via INTRAVENOUS
  Filled 2017-02-15: qty 20

## 2017-02-15 MED ORDER — DEXAMETHASONE SODIUM PHOSPHATE 10 MG/ML IJ SOLN
INTRAMUSCULAR | Status: AC
  Filled 2017-02-15: qty 1

## 2017-02-15 MED ORDER — LEUCOVORIN CALCIUM INJECTION 350 MG
400.0000 mg/m2 | Freq: Once | INTRAVENOUS | Status: AC
  Administered 2017-02-15: 704 mg via INTRAVENOUS
  Filled 2017-02-15: qty 35.2

## 2017-02-15 MED ORDER — DEXTROSE 5 % IV SOLN
Freq: Once | INTRAVENOUS | Status: AC
  Administered 2017-02-15: 11:00:00 via INTRAVENOUS

## 2017-02-15 MED ORDER — SODIUM CHLORIDE 0.9% FLUSH
10.0000 mL | Freq: Once | INTRAVENOUS | Status: AC
  Administered 2017-02-15: 10 mL
  Filled 2017-02-15: qty 10

## 2017-02-15 NOTE — Telephone Encounter (Signed)
Scheduled appt per 1/16 los - - Patient to get an updated schedule in treatment area.

## 2017-02-15 NOTE — Patient Instructions (Signed)
[facility] Discharge Instructions for Patients Receiving Chemotherapy  Today you received the following chemotherapy agents: Oxaliplatin, leucovorin, and 5FU  To help prevent nausea and vomiting after your treatment, we encourage you to take your nausea medication as directed If you develop nausea and vomiting that is not controlled by your nausea medication, call the clinic.   BELOW ARE SYMPTOMS THAT SHOULD BE REPORTED IMMEDIATELY:  *FEVER GREATER THAN 100.5 F  *CHILLS WITH OR WITHOUT FEVER  NAUSEA AND VOMITING THAT IS NOT CONTROLLED WITH YOUR NAUSEA MEDICATION  *UNUSUAL SHORTNESS OF BREATH  *UNUSUAL BRUISING OR BLEEDING  TENDERNESS IN MOUTH AND THROAT WITH OR WITHOUT PRESENCE OF ULCERS  *URINARY PROBLEMS  *BOWEL PROBLEMS  UNUSUAL RASH Items with * indicate a potential emergency and should be followed up as soon as possible.  Feel free to call the clinic should you have any questions or concerns. The clinic phone number is [phone].  Please show the CHEMO ALERT CARD at check-in to the Emergency Department and triage nurse.   

## 2017-02-15 NOTE — Progress Notes (Signed)
[facility] OFFICE PROGRESS NOTE   Diagnosis: Esophagus cancer  INTERVAL HISTORY:   [name] returns as scheduled.  He completed another cycle of FOLFOX 02/01/2017.  No nausea, mouth sores, or diarrhea following chemotherapy.  He had cold sensitivity for a few days.  No other neuropathy symptoms.  No dysphasia.  He continues to have pain at the back.  He takes approximately 3 hydrocodone tablets per day.  Objective:  Vital signs in last 24 hours:  Blood pressure 112/75, pulse 100, temperature 98.2 F (36.8 C), temperature source Oral, resp. rate 18, height '5\' 8"'$  (1.727 m), weight 153 lb (69.4 kg).    HEENT: No thrush or ulcers Resp: Lungs clear bilaterally Cardio: Regular rate and rhythm GI: No hepatomegaly, nontender Vascular: No leg edema  Skin: Palms without erythema  Portacath/PICC-without erythema  Lab Results:  Lab Results  Component Value Date   WBC 10.0 02/15/2017   HGB 10.2 (L) 02/15/2017   HCT 33.4 (L) 02/15/2017   MCV 90.3 02/15/2017   PLT 267 02/15/2017   NEUTROABS 7.1 (H) 02/15/2017    CMP     Component Value Date/Time   NA 140 02/15/2017 0844   NA 139 02/01/2017 0853   K 3.9 02/15/2017 0844   K 3.6 02/01/2017 0853   CL 106 02/15/2017 0844   CO2 25 02/15/2017 0844   CO2 25 02/01/2017 0853   GLUCOSE 100 02/15/2017 0844   GLUCOSE 102 02/01/2017 0853   BUN 16 02/15/2017 0844   BUN 18.3 02/01/2017 0853   CREATININE 0.76 02/15/2017 0844   CREATININE 0.7 02/01/2017 0853   CALCIUM 9.5 02/15/2017 0844   CALCIUM 8.8 02/01/2017 0853   PROT 6.3 (L) 02/15/2017 0844   PROT 6.0 (L) 02/01/2017 0853   ALBUMIN 2.7 (L) 02/15/2017 0844   ALBUMIN 2.3 (L) 02/01/2017 0853   AST 34 02/15/2017 0844   AST 31 02/01/2017 0853   ALT 33 02/15/2017 0844   ALT 41 02/01/2017 0853   ALKPHOS 234 (H) 02/15/2017 0844   ALKPHOS 308 (H) 02/01/2017 0853   BILITOT 0.3 02/15/2017 0844   BILITOT 0.23 02/01/2017 0853   GFRNONAA >60 02/15/2017 0844   GFRAA >60  02/15/2017 0844    Lab Results  Component Value Date   CEA1 1,397.97 (H) 02/15/2017     Medications: I have reviewed the patient's current medications.   Assessment/Plan: 1. Metastatic esophagus cancer ? Distal esophagus mass at upper endoscopy 12/15/2016, biopsy confirmed invasive poorly differentiated adenocarcinoma ? CTs of 02/16/2016-distal esophagus thickening, mediastinal/right hilar lymphadenopathy, abdominal/retroperitoneal lymphadenopathy, liver metastases ? MRI lumbar spine 12/20/2016-7 mm S1 lesion suspicious for metastasis, bulky retroperitoneal and prevertebral malignant lymphadenopathy, leftpsoasmuscle metastasis, degenerative disease causing severe neural foraminal stenosis at right L4 and left L5 ? PET scan 12/27/2016-hypermetabolic mass at the gastroesophageal junction; extensive hypermetabolic adenopathy involving the right supraclavicular nodal station, mediastinal lymph nodes, retrocrural lymph nodes, periaortic lymph nodes, left iliac lymph nodes and central mesenteric lymph nodes; fine nodularity in the right upper lobe with hypermetabolic thickening in the right suprahilar peribronchial structures; hypermetabolic metastasis to the right hepatic lobe; several small skeletal metastasis including right iliac bone, left sacrum and left scapula; multiple soft tissue metastases to the subcutaneous tissues adjacent to the left scapular musculature, retroperitoneal nodal metastasis along the left psoas muscle and metastatic lesion within the left gluteal musculature. No lesion in the lumbar spine or spinal canal to explain the patient's acute right-sided leg pain. ? Ultrasound-guided biopsy of a right liver lesion 01/04/2017-poorly differentiated  adenocarcinoma ? PD-L1 expression30% ? Cycle 1 FOLFOX 01/05/2017 ? Cycle 2 FOLFOX 01/19/2017 ? Cycle 3 FOLFOX 02/01/2017 ? Cycle 4 FOLFOX 02/15/2017 2. Painpossibly due to disc herniation-the pain may be related to retroperitoneal  lymphadenopathy or bone metastases;plain x-ray right hip 12/21/2016 with no blastic or lytic bone lesions. No fracture or dislocation.Review of the lumbar spine MRI shows right L4 disc herniation which could be responsible for the right leg pain and weakness. Trial of dexamethasone initiatedwith improvement.  3.Rheumatoid arthritis  4.Dysphagia/weight loss secondary to #1-improved 02/01/2017.  5.Chronic left pleural effusion  6.Port-A-Cath placement 01/04/2017    Disposition: [name] has completed 3 cycles of FOLFOX.  He has tolerated the chemotherapy well.  His performance status is significantly improved.  He is gaining weight and no longer has dysphasia.  The CEA is lower.  The plan is to proceed with cycle 4 FOLFOX today.  He will return for an office visit and chemotherapy in 2 weeks. He will undergo restaging CTs after cycle 5.  15 minutes were spent with the patient today.  The majority of the time was used for counseling and coordination of care.  Betsy Coder, MD  02/15/2017  10:20 AM

## 2017-02-17 ENCOUNTER — Inpatient Hospital Stay (HOSPITAL_BASED_OUTPATIENT_CLINIC_OR_DEPARTMENT_OTHER): Payer: No Typology Code available for payment source

## 2017-02-17 VITALS — BP 107/81 | HR 82 | Temp 97.3°F | Resp 18

## 2017-02-17 DIAGNOSIS — R1310 Dysphagia, unspecified: Secondary | ICD-10-CM | POA: Diagnosis not present

## 2017-02-17 DIAGNOSIS — M549 Dorsalgia, unspecified: Secondary | ICD-10-CM | POA: Diagnosis not present

## 2017-02-17 DIAGNOSIS — J90 Pleural effusion, not elsewhere classified: Secondary | ICD-10-CM | POA: Diagnosis not present

## 2017-02-17 DIAGNOSIS — C155 Malignant neoplasm of lower third of esophagus: Secondary | ICD-10-CM | POA: Diagnosis not present

## 2017-02-17 DIAGNOSIS — R634 Abnormal weight loss: Secondary | ICD-10-CM | POA: Diagnosis not present

## 2017-02-17 DIAGNOSIS — C779 Secondary and unspecified malignant neoplasm of lymph node, unspecified: Secondary | ICD-10-CM

## 2017-02-17 DIAGNOSIS — Z9221 Personal history of antineoplastic chemotherapy: Secondary | ICD-10-CM | POA: Diagnosis not present

## 2017-02-17 DIAGNOSIS — C787 Secondary malignant neoplasm of liver and intrahepatic bile duct: Secondary | ICD-10-CM

## 2017-02-17 DIAGNOSIS — M069 Rheumatoid arthritis, unspecified: Secondary | ICD-10-CM

## 2017-02-17 DIAGNOSIS — Z5111 Encounter for antineoplastic chemotherapy: Secondary | ICD-10-CM | POA: Diagnosis not present

## 2017-02-17 DIAGNOSIS — M5126 Other intervertebral disc displacement, lumbar region: Secondary | ICD-10-CM

## 2017-02-17 MED ORDER — HEPARIN SOD (PORK) LOCK FLUSH 100 UNIT/ML IV SOLN
500.0000 [IU] | Freq: Once | INTRAVENOUS | Status: AC | PRN
  Administered 2017-02-17: 500 [IU]
  Filled 2017-02-17: qty 5

## 2017-02-17 MED ORDER — SODIUM CHLORIDE 0.9% FLUSH
10.0000 mL | INTRAVENOUS | Status: DC | PRN
  Administered 2017-02-17: 10 mL
  Filled 2017-02-17: qty 10

## 2017-02-26 ENCOUNTER — Other Ambulatory Visit: Payer: Self-pay | Admitting: Oncology

## 2017-03-01 ENCOUNTER — Inpatient Hospital Stay: Payer: No Typology Code available for payment source

## 2017-03-01 ENCOUNTER — Telehealth: Payer: Self-pay | Admitting: Nurse Practitioner

## 2017-03-01 ENCOUNTER — Inpatient Hospital Stay (HOSPITAL_BASED_OUTPATIENT_CLINIC_OR_DEPARTMENT_OTHER): Payer: No Typology Code available for payment source | Admitting: Nurse Practitioner

## 2017-03-01 ENCOUNTER — Encounter: Payer: Self-pay | Admitting: Nurse Practitioner

## 2017-03-01 VITALS — BP 118/78 | HR 76 | Temp 98.4°F | Resp 18 | Ht 68.0 in | Wt 157.3 lb

## 2017-03-01 DIAGNOSIS — R1310 Dysphagia, unspecified: Secondary | ICD-10-CM

## 2017-03-01 DIAGNOSIS — C155 Malignant neoplasm of lower third of esophagus: Secondary | ICD-10-CM

## 2017-03-01 DIAGNOSIS — Z9221 Personal history of antineoplastic chemotherapy: Secondary | ICD-10-CM

## 2017-03-01 DIAGNOSIS — C779 Secondary and unspecified malignant neoplasm of lymph node, unspecified: Secondary | ICD-10-CM | POA: Diagnosis not present

## 2017-03-01 DIAGNOSIS — C787 Secondary malignant neoplasm of liver and intrahepatic bile duct: Secondary | ICD-10-CM

## 2017-03-01 DIAGNOSIS — M5126 Other intervertebral disc displacement, lumbar region: Secondary | ICD-10-CM | POA: Diagnosis not present

## 2017-03-01 DIAGNOSIS — R634 Abnormal weight loss: Secondary | ICD-10-CM | POA: Diagnosis not present

## 2017-03-01 DIAGNOSIS — M549 Dorsalgia, unspecified: Secondary | ICD-10-CM

## 2017-03-01 DIAGNOSIS — M069 Rheumatoid arthritis, unspecified: Secondary | ICD-10-CM

## 2017-03-01 DIAGNOSIS — R5383 Other fatigue: Secondary | ICD-10-CM | POA: Diagnosis not present

## 2017-03-01 DIAGNOSIS — J90 Pleural effusion, not elsewhere classified: Secondary | ICD-10-CM

## 2017-03-01 DIAGNOSIS — Z5111 Encounter for antineoplastic chemotherapy: Secondary | ICD-10-CM | POA: Diagnosis not present

## 2017-03-01 DIAGNOSIS — Z95828 Presence of other vascular implants and grafts: Secondary | ICD-10-CM

## 2017-03-01 LAB — CBC WITH DIFFERENTIAL/PLATELET
BASOS PCT: 1 %
Basophils Absolute: 0.1 10*3/uL (ref 0.0–0.1)
Eosinophils Absolute: 0.3 10*3/uL (ref 0.0–0.5)
Eosinophils Relative: 3 %
HEMATOCRIT: 32.2 % — AB (ref 38.4–49.9)
HEMOGLOBIN: 10.1 g/dL — AB (ref 13.0–17.1)
Lymphocytes Relative: 17 %
Lymphs Abs: 1.8 10*3/uL (ref 0.9–3.3)
MCH: 27.8 pg (ref 27.2–33.4)
MCHC: 31.4 g/dL — ABNORMAL LOW (ref 32.0–36.0)
MCV: 88.6 fL (ref 79.3–98.0)
MONO ABS: 1.1 10*3/uL — AB (ref 0.1–0.9)
Monocytes Relative: 11 %
NEUTROS ABS: 7.2 10*3/uL — AB (ref 1.5–6.5)
NEUTROS PCT: 68 %
Platelets: 350 10*3/uL (ref 140–400)
RBC: 3.64 MIL/uL — ABNORMAL LOW (ref 4.20–5.82)
RDW: 20.6 % — AB (ref 11.0–14.6)
WBC: 10.5 10*3/uL — ABNORMAL HIGH (ref 4.0–10.3)

## 2017-03-01 LAB — COMPREHENSIVE METABOLIC PANEL
ALK PHOS: 218 U/L — AB (ref 40–150)
ALT: 34 U/L (ref 0–55)
ANION GAP: 7 (ref 3–11)
AST: 38 U/L — ABNORMAL HIGH (ref 5–34)
Albumin: 2.7 g/dL — ABNORMAL LOW (ref 3.5–5.0)
BUN: 17 mg/dL (ref 7–26)
CALCIUM: 9.2 mg/dL (ref 8.4–10.4)
CO2: 26 mmol/L (ref 22–29)
Chloride: 105 mmol/L (ref 98–109)
Creatinine, Ser: 0.75 mg/dL (ref 0.70–1.30)
GFR calc non Af Amer: >60 mL/min (ref >60)
Glucose, Bld: 86 mg/dL (ref 70–140)
Potassium: 4.4 mmol/L (ref 3.5–5.1)
Sodium: 138 mmol/L (ref 136–145)
Total Protein: 6.4 g/dL (ref 6.4–8.3)

## 2017-03-01 MED ORDER — SODIUM CHLORIDE 0.9 % IV SOLN
2400.0000 mg/m2 | INTRAVENOUS | Status: DC
  Administered 2017-03-01: 4200 mg via INTRAVENOUS
  Filled 2017-03-01: qty 84

## 2017-03-01 MED ORDER — PALONOSETRON HCL INJECTION 0.25 MG/5ML
0.2500 mg | Freq: Once | INTRAVENOUS | Status: AC
  Administered 2017-03-01: 0.25 mg via INTRAVENOUS

## 2017-03-01 MED ORDER — LEUCOVORIN CALCIUM INJECTION 350 MG
400.0000 mg/m2 | Freq: Once | INTRAVENOUS | Status: AC
  Administered 2017-03-01: 704 mg via INTRAVENOUS
  Filled 2017-03-01: qty 35.2

## 2017-03-01 MED ORDER — PALONOSETRON HCL INJECTION 0.25 MG/5ML
INTRAVENOUS | Status: AC
  Filled 2017-03-01: qty 5

## 2017-03-01 MED ORDER — SODIUM CHLORIDE 0.9% FLUSH
10.0000 mL | Freq: Once | INTRAVENOUS | Status: AC
  Administered 2017-03-01: 10 mL
  Filled 2017-03-01: qty 10

## 2017-03-01 MED ORDER — DEXTROSE 5 % IV SOLN
Freq: Once | INTRAVENOUS | Status: AC
  Administered 2017-03-01: 10:00:00 via INTRAVENOUS

## 2017-03-01 MED ORDER — DEXAMETHASONE SODIUM PHOSPHATE 10 MG/ML IJ SOLN
INTRAMUSCULAR | Status: AC
  Filled 2017-03-01: qty 1

## 2017-03-01 MED ORDER — DEXAMETHASONE SODIUM PHOSPHATE 10 MG/ML IJ SOLN
10.0000 mg | Freq: Once | INTRAMUSCULAR | Status: AC
  Administered 2017-03-01: 10 mg via INTRAVENOUS

## 2017-03-01 MED ORDER — OXALIPLATIN CHEMO INJECTION 100 MG/20ML
85.0000 mg/m2 | Freq: Once | INTRAVENOUS | Status: AC
  Administered 2017-03-01: 150 mg via INTRAVENOUS
  Filled 2017-03-01: qty 10

## 2017-03-01 NOTE — Patient Instructions (Signed)
[facility] for Patients Receiving Chemotherapy  Today you received the following chemotherapy agents: Oxaliplatin, leucovorin, and 5FU  To help prevent nausea and vomiting after your treatment, we encourage you to take your nausea medication as directed If you develop nausea and vomiting that is not controlled by your nausea medication, call the clinic.   BELOW ARE SYMPTOMS THAT SHOULD BE REPORTED IMMEDIATELY:  *FEVER GREATER THAN 100.5 F  *CHILLS WITH OR WITHOUT FEVER  NAUSEA AND VOMITING THAT IS NOT CONTROLLED WITH YOUR NAUSEA MEDICATION  *UNUSUAL SHORTNESS OF BREATH  *UNUSUAL BRUISING OR BLEEDING  TENDERNESS IN MOUTH AND THROAT WITH OR WITHOUT PRESENCE OF ULCERS  *URINARY PROBLEMS  *BOWEL PROBLEMS  UNUSUAL RASH Items with * indicate a potential emergency and should be followed up as soon as possible.  Feel free to call the clinic should you have any questions or concerns. The clinic phone number is [phone].  Please show the CHEMO ALERT CARD at check-in to the Emergency Department and triage nurse.   

## 2017-03-01 NOTE — Telephone Encounter (Signed)
Scheduled appt per 1/30 los - Pt is aware of appt no print out wanted.

## 2017-03-01 NOTE — Progress Notes (Signed)
[facility] OFFICE PROGRESS NOTE   Diagnosis: Esophagus cancer  INTERVAL HISTORY:   Grant Todd returns as scheduled.  He completed cycle 4 FOLFOX 02/15/2017.  He denies nausea/vomiting.  No mouth sores.  No diarrhea.  He has mild cold sensitivity for 2-3 days after each treatment.  No persistent neuropathy symptoms.  He is tolerating a regular diet.  No dysphagia.  He describes his pain as "minor".  Main complaint is more fatigue.  Objective:  Vital signs in last 24 hours:  Blood pressure 118/78, pulse 76, temperature 98.4 F (36.9 C), temperature source Oral, resp. rate 18, height '5\' 8"'$  (1.727 m), weight 157 lb 4.8 oz (71.4 kg), SpO2 100 %.    HEENT: No thrush or ulcers. Resp: Lungs clear bilaterally. Cardio: Regular rate and rhythm. GI: Abdomen soft and nontender.  No hepatomegaly. Vascular: No leg edema. Neuro: Vibratory sense mildly decreased over the fingertips per tuning fork exam. Skin: Palms without erythema. Port-A-Cath without erythema.   Lab Results:  Lab Results  Component Value Date   WBC 10.5 (H) 03/01/2017   HGB 10.1 (L) 03/01/2017   HCT 32.2 (L) 03/01/2017   MCV 88.6 03/01/2017   PLT 350 03/01/2017   NEUTROABS 7.2 (H) 03/01/2017    Imaging:  No results found.  Medications: I have reviewed the patient's current medications.  Assessment/Plan: 1. Metastatic esophagus cancer ? Distal esophagus mass at upper endoscopy 12/15/2016, biopsy confirmed invasive poorly differentiated adenocarcinoma ? CTs of 02/16/2016-distal esophagus thickening, mediastinal/right hilar lymphadenopathy, abdominal/retroperitoneal lymphadenopathy, liver metastases ? MRI lumbar spine 12/20/2016-7 mm S1 lesion suspicious for metastasis, bulky retroperitoneal and prevertebral malignant lymphadenopathy, leftpsoasmuscle metastasis, degenerative disease causing severe neural foraminal stenosis at right L4 and left L5 ? PET scan 12/27/2016-hypermetabolic mass at the  gastroesophageal junction; extensive hypermetabolic adenopathy involving the right supraclavicular nodal station, mediastinal lymph nodes, retrocrural lymph nodes, periaortic lymph nodes, left iliac lymph nodes and central mesenteric lymph nodes; fine nodularity in the right upper lobe with hypermetabolic thickening in the right suprahilar peribronchial structures; hypermetabolic metastasis to the right hepatic lobe; several small skeletal metastasis including right iliac bone, left sacrum and left scapula; multiple soft tissue metastases to the subcutaneous tissues adjacent to the left scapular musculature, retroperitoneal nodal metastasis along the left psoas muscle and metastatic lesion within the left gluteal musculature. No lesion in the lumbar spine or spinal canal to explain the patient's acute right-sided leg pain. ? Ultrasound-guided biopsy of a right liver lesion 01/04/2017-poorly differentiated adenocarcinoma ? PD-L1 expression30% ? Cycle 1 FOLFOX 01/05/2017 ? Cycle 2 FOLFOX 01/19/2017 ? Cycle 3 FOLFOX1/03/2017 ? Cycle 4 FOLFOX 02/15/2017 ? Cycle 5 FOLFOX 03/01/2017 2. Painpossibly due to disc herniation-the pain may be related to retroperitoneal lymphadenopathy or bone metastases;plain x-ray right hip 12/21/2016 with no blastic or lytic bone lesions. No fracture or dislocation.Review of the lumbar spine MRI shows right L4 disc herniation which could be responsible for the right leg pain and weakness. Trial of dexamethasone initiatedwith improvement.  3.Rheumatoid arthritis  4.Dysphagia/weight loss secondary to #1-improved 02/01/2017.  5.Chronic left pleural effusion  6.Port-A-Cath placement 01/04/2017     Disposition: Grant Todd appears stable.  He has completed 4 cycles of FOLFOX.  Performance status continues to improve.  Plan to proceed with cycle 5 today as scheduled.  He will have restaging CTs around 03/13/2017.  He will return for a follow-up visit on 03/15/2017.   He will contact the office in the interim with any problems.    Ned Card ANP/GNP-BC  03/01/2017  10:06 AM

## 2017-03-03 ENCOUNTER — Inpatient Hospital Stay: Payer: No Typology Code available for payment source | Attending: Oncology

## 2017-03-03 VITALS — BP 109/61 | HR 69 | Temp 98.1°F | Resp 18

## 2017-03-03 DIAGNOSIS — C7951 Secondary malignant neoplasm of bone: Secondary | ICD-10-CM | POA: Insufficient documentation

## 2017-03-03 DIAGNOSIS — R634 Abnormal weight loss: Secondary | ICD-10-CM | POA: Insufficient documentation

## 2017-03-03 DIAGNOSIS — I700 Atherosclerosis of aorta: Secondary | ICD-10-CM | POA: Diagnosis not present

## 2017-03-03 DIAGNOSIS — R1319 Other dysphagia: Secondary | ICD-10-CM | POA: Diagnosis not present

## 2017-03-03 DIAGNOSIS — R05 Cough: Secondary | ICD-10-CM | POA: Insufficient documentation

## 2017-03-03 DIAGNOSIS — C787 Secondary malignant neoplasm of liver and intrahepatic bile duct: Secondary | ICD-10-CM | POA: Diagnosis not present

## 2017-03-03 DIAGNOSIS — T451X5S Adverse effect of antineoplastic and immunosuppressive drugs, sequela: Secondary | ICD-10-CM | POA: Diagnosis not present

## 2017-03-03 DIAGNOSIS — C155 Malignant neoplasm of lower third of esophagus: Secondary | ICD-10-CM | POA: Diagnosis not present

## 2017-03-03 DIAGNOSIS — M5126 Other intervertebral disc displacement, lumbar region: Secondary | ICD-10-CM | POA: Insufficient documentation

## 2017-03-03 DIAGNOSIS — G620 Drug-induced polyneuropathy: Secondary | ICD-10-CM | POA: Insufficient documentation

## 2017-03-03 DIAGNOSIS — M549 Dorsalgia, unspecified: Secondary | ICD-10-CM | POA: Insufficient documentation

## 2017-03-03 DIAGNOSIS — R0600 Dyspnea, unspecified: Secondary | ICD-10-CM | POA: Diagnosis not present

## 2017-03-03 DIAGNOSIS — G4700 Insomnia, unspecified: Secondary | ICD-10-CM | POA: Insufficient documentation

## 2017-03-03 DIAGNOSIS — I2510 Atherosclerotic heart disease of native coronary artery without angina pectoris: Secondary | ICD-10-CM | POA: Diagnosis not present

## 2017-03-03 DIAGNOSIS — M069 Rheumatoid arthritis, unspecified: Secondary | ICD-10-CM | POA: Insufficient documentation

## 2017-03-03 DIAGNOSIS — J90 Pleural effusion, not elsewhere classified: Secondary | ICD-10-CM | POA: Diagnosis not present

## 2017-03-03 DIAGNOSIS — Z452 Encounter for adjustment and management of vascular access device: Secondary | ICD-10-CM | POA: Insufficient documentation

## 2017-03-03 DIAGNOSIS — R590 Localized enlarged lymph nodes: Secondary | ICD-10-CM | POA: Insufficient documentation

## 2017-03-03 DIAGNOSIS — Z5111 Encounter for antineoplastic chemotherapy: Secondary | ICD-10-CM | POA: Insufficient documentation

## 2017-03-03 DIAGNOSIS — Z95828 Presence of other vascular implants and grafts: Secondary | ICD-10-CM

## 2017-03-03 DIAGNOSIS — Z79899 Other long term (current) drug therapy: Secondary | ICD-10-CM | POA: Insufficient documentation

## 2017-03-03 MED ORDER — HEPARIN SOD (PORK) LOCK FLUSH 100 UNIT/ML IV SOLN
500.0000 [IU] | Freq: Once | INTRAVENOUS | Status: AC
  Administered 2017-03-03: 500 [IU]
  Filled 2017-03-03: qty 5

## 2017-03-03 MED ORDER — SODIUM CHLORIDE 0.9% FLUSH
10.0000 mL | Freq: Once | INTRAVENOUS | Status: AC
  Administered 2017-03-03: 10 mL
  Filled 2017-03-03: qty 10

## 2017-03-12 ENCOUNTER — Other Ambulatory Visit: Payer: Self-pay | Admitting: Oncology

## 2017-03-13 ENCOUNTER — Ambulatory Visit (HOSPITAL_COMMUNITY)
Admission: RE | Admit: 2017-03-13 | Discharge: 2017-03-13 | Disposition: A | Discharge status: Home / Self Care | Payer: No Typology Code available for payment source | Source: Ambulatory Visit | Attending: Nurse Practitioner | Admitting: Nurse Practitioner

## 2017-03-13 DIAGNOSIS — R599 Enlarged lymph nodes, unspecified: Secondary | ICD-10-CM | POA: Diagnosis not present

## 2017-03-13 DIAGNOSIS — K769 Liver disease, unspecified: Secondary | ICD-10-CM | POA: Diagnosis not present

## 2017-03-13 DIAGNOSIS — I2510 Atherosclerotic heart disease of native coronary artery without angina pectoris: Secondary | ICD-10-CM | POA: Insufficient documentation

## 2017-03-13 DIAGNOSIS — C155 Malignant neoplasm of lower third of esophagus: Secondary | ICD-10-CM | POA: Insufficient documentation

## 2017-03-13 DIAGNOSIS — J90 Pleural effusion, not elsewhere classified: Secondary | ICD-10-CM | POA: Diagnosis not present

## 2017-03-13 DIAGNOSIS — I700 Atherosclerosis of aorta: Secondary | ICD-10-CM | POA: Insufficient documentation

## 2017-03-13 MED ORDER — IOPAMIDOL (ISOVUE-370) INJECTION 76%
INTRAVENOUS | Status: AC
  Filled 2017-03-13: qty 100

## 2017-03-13 MED ORDER — IOPAMIDOL (ISOVUE-300) INJECTION 61%
100.0000 mL | Freq: Once | INTRAVENOUS | Status: AC | PRN
  Administered 2017-03-13: 100 mL via INTRAVENOUS

## 2017-03-13 MED ORDER — SODIUM CHLORIDE 0.9 % IJ SOLN
INTRAMUSCULAR | Status: AC
  Filled 2017-03-13: qty 50

## 2017-03-15 ENCOUNTER — Inpatient Hospital Stay: Payer: No Typology Code available for payment source

## 2017-03-15 ENCOUNTER — Inpatient Hospital Stay (HOSPITAL_BASED_OUTPATIENT_CLINIC_OR_DEPARTMENT_OTHER): Payer: No Typology Code available for payment source | Admitting: Oncology

## 2017-03-15 ENCOUNTER — Telehealth: Payer: Self-pay | Admitting: Oncology

## 2017-03-15 ENCOUNTER — Encounter: Payer: Self-pay | Admitting: Oncology

## 2017-03-15 VITALS — BP 118/74 | HR 102 | Temp 98.6°F | Resp 18 | Ht 68.0 in | Wt 159.9 lb

## 2017-03-15 DIAGNOSIS — R590 Localized enlarged lymph nodes: Secondary | ICD-10-CM | POA: Diagnosis not present

## 2017-03-15 DIAGNOSIS — G4700 Insomnia, unspecified: Secondary | ICD-10-CM

## 2017-03-15 DIAGNOSIS — C155 Malignant neoplasm of lower third of esophagus: Secondary | ICD-10-CM

## 2017-03-15 DIAGNOSIS — R634 Abnormal weight loss: Secondary | ICD-10-CM

## 2017-03-15 DIAGNOSIS — T451X5S Adverse effect of antineoplastic and immunosuppressive drugs, sequela: Secondary | ICD-10-CM

## 2017-03-15 DIAGNOSIS — M069 Rheumatoid arthritis, unspecified: Secondary | ICD-10-CM

## 2017-03-15 DIAGNOSIS — J90 Pleural effusion, not elsewhere classified: Secondary | ICD-10-CM

## 2017-03-15 DIAGNOSIS — G620 Drug-induced polyneuropathy: Secondary | ICD-10-CM

## 2017-03-15 DIAGNOSIS — C787 Secondary malignant neoplasm of liver and intrahepatic bile duct: Secondary | ICD-10-CM

## 2017-03-15 DIAGNOSIS — M5126 Other intervertebral disc displacement, lumbar region: Secondary | ICD-10-CM

## 2017-03-15 DIAGNOSIS — I2510 Atherosclerotic heart disease of native coronary artery without angina pectoris: Secondary | ICD-10-CM

## 2017-03-15 DIAGNOSIS — C7951 Secondary malignant neoplasm of bone: Secondary | ICD-10-CM | POA: Diagnosis not present

## 2017-03-15 DIAGNOSIS — Z95828 Presence of other vascular implants and grafts: Secondary | ICD-10-CM

## 2017-03-15 DIAGNOSIS — R1319 Other dysphagia: Secondary | ICD-10-CM | POA: Diagnosis not present

## 2017-03-15 DIAGNOSIS — M549 Dorsalgia, unspecified: Secondary | ICD-10-CM

## 2017-03-15 DIAGNOSIS — Z79899 Other long term (current) drug therapy: Secondary | ICD-10-CM

## 2017-03-15 DIAGNOSIS — Z5111 Encounter for antineoplastic chemotherapy: Secondary | ICD-10-CM | POA: Diagnosis not present

## 2017-03-15 DIAGNOSIS — I700 Atherosclerosis of aorta: Secondary | ICD-10-CM

## 2017-03-15 LAB — CMP (CANCER CENTER ONLY)
ALBUMIN: 2.9 g/dL — AB (ref 3.5–5.0)
ALK PHOS: 162 U/L — AB (ref 40–150)
ALT: 16 U/L (ref 0–55)
ANION GAP: 11 (ref 3–11)
AST: 27 U/L (ref 5–34)
BUN: 19 mg/dL (ref 7–26)
CALCIUM: 9.3 mg/dL (ref 8.4–10.4)
CHLORIDE: 107 mmol/L (ref 98–109)
CO2: 22 mmol/L (ref 22–29)
Creatinine: 0.83 mg/dL (ref 0.70–1.30)
GFR, Est AFR Am: >60 mL/min (ref >60)
GFR, Estimated: >60 mL/min (ref >60)
GLUCOSE: 106 mg/dL (ref 70–140)
POTASSIUM: 3.8 mmol/L (ref 3.5–5.1)
SODIUM: 140 mmol/L (ref 136–145)
Total Bilirubin: 0.3 mg/dL (ref 0.2–1.2)
Total Protein: 6.8 g/dL (ref 6.4–8.3)

## 2017-03-15 LAB — CBC WITH DIFFERENTIAL (CANCER CENTER ONLY)
Basophils Absolute: 0 10*3/uL (ref 0.0–0.1)
Basophils Relative: 0 %
Eosinophils Absolute: 0.3 10*3/uL (ref 0.0–0.5)
Eosinophils Relative: 3 %
HEMATOCRIT: 33.4 % — AB (ref 38.4–49.9)
Hemoglobin: 10.3 g/dL — ABNORMAL LOW (ref 13.0–17.1)
LYMPHS ABS: 1.6 10*3/uL (ref 0.9–3.3)
LYMPHS PCT: 16 %
MCH: 28.3 pg (ref 27.2–33.4)
MCHC: 30.8 g/dL — AB (ref 32.0–36.0)
MCV: 91.8 fL (ref 79.3–98.0)
MONO ABS: 1 10*3/uL — AB (ref 0.1–0.9)
MONOS PCT: 10 %
NEUTROS ABS: 6.8 10*3/uL — AB (ref 1.5–6.5)
Neutrophils Relative %: 71 %
Platelet Count: 279 10*3/uL (ref 140–400)
RBC: 3.64 MIL/uL — ABNORMAL LOW (ref 4.20–5.82)
RDW: 20.3 % — AB (ref 11.0–14.6)
WBC Count: 9.7 10*3/uL (ref 4.0–10.3)

## 2017-03-15 LAB — CEA (IN HOUSE-CHCC): CEA (CHCC-IN HOUSE): 769.22 ng/mL — AB (ref 0.00–5.00)

## 2017-03-15 MED ORDER — LEUCOVORIN CALCIUM INJECTION 350 MG
400.0000 mg/m2 | Freq: Once | INTRAMUSCULAR | Status: AC
  Administered 2017-03-15: 704 mg via INTRAVENOUS
  Filled 2017-03-15: qty 35.2

## 2017-03-15 MED ORDER — DEXAMETHASONE SODIUM PHOSPHATE 10 MG/ML IJ SOLN
INTRAMUSCULAR | Status: AC
  Filled 2017-03-15: qty 1

## 2017-03-15 MED ORDER — PALONOSETRON HCL INJECTION 0.25 MG/5ML
INTRAVENOUS | Status: AC
  Filled 2017-03-15: qty 5

## 2017-03-15 MED ORDER — OXALIPLATIN CHEMO INJECTION 100 MG/20ML
85.0000 mg/m2 | Freq: Once | INTRAVENOUS | Status: AC
  Administered 2017-03-15: 150 mg via INTRAVENOUS
  Filled 2017-03-15: qty 20

## 2017-03-15 MED ORDER — SODIUM CHLORIDE 0.9% FLUSH
10.0000 mL | Freq: Once | INTRAVENOUS | Status: AC
  Administered 2017-03-15: 10 mL
  Filled 2017-03-15: qty 10

## 2017-03-15 MED ORDER — DEXTROSE 5 % IV SOLN
Freq: Once | INTRAVENOUS | Status: AC
  Administered 2017-03-15: 10:00:00 via INTRAVENOUS

## 2017-03-15 MED ORDER — SODIUM CHLORIDE 0.9 % IV SOLN
2400.0000 mg/m2 | INTRAVENOUS | Status: DC
  Administered 2017-03-15: 4200 mg via INTRAVENOUS
  Filled 2017-03-15: qty 84

## 2017-03-15 MED ORDER — DEXAMETHASONE SODIUM PHOSPHATE 10 MG/ML IJ SOLN
10.0000 mg | Freq: Once | INTRAMUSCULAR | Status: AC
  Administered 2017-03-15: 10 mg via INTRAVENOUS

## 2017-03-15 MED ORDER — PALONOSETRON HCL INJECTION 0.25 MG/5ML
0.2500 mg | Freq: Once | INTRAVENOUS | Status: AC
  Administered 2017-03-15: 0.25 mg via INTRAVENOUS

## 2017-03-15 NOTE — Patient Instructions (Signed)
[facility] Discharge Instructions for Patients Receiving Chemotherapy  Today you received the following chemotherapy agents oxaliplatin/leucovorin/florouracil   To help prevent nausea and vomiting after your treatment, we encourage you to take your nausea medication as directed.   If you develop nausea and vomiting that is not controlled by your nausea medication, call the clinic.   BELOW ARE SYMPTOMS THAT SHOULD BE REPORTED IMMEDIATELY:  *FEVER GREATER THAN 100.5 F  *CHILLS WITH OR WITHOUT FEVER  NAUSEA AND VOMITING THAT IS NOT CONTROLLED WITH YOUR NAUSEA MEDICATION  *UNUSUAL SHORTNESS OF BREATH  *UNUSUAL BRUISING OR BLEEDING  TENDERNESS IN MOUTH AND THROAT WITH OR WITHOUT PRESENCE OF ULCERS  *URINARY PROBLEMS  *BOWEL PROBLEMS  UNUSUAL RASH Items with * indicate a potential emergency and should be followed up as soon as possible.  Feel free to call the clinic you have any questions or concerns. The clinic phone number is [phone].  

## 2017-03-15 NOTE — Progress Notes (Signed)
[place] OFFICE PROGRESS NOTE   Diagnosis: Esophagus cancer  INTERVAL HISTORY:   Grant Todd returns as scheduled.  He completed another cycle of FOLFOX 03/01/2017.  He tolerated the chemotherapy well.  He has cold sensitivity.  No other neuropathy symptoms.  No dysphasia.  Good appetite.  Mild back discomfort.  He is not taking pain medication.  He complains of insomnia.  Objective:  Vital signs in last 24 hours:  Blood pressure 118/74, pulse (!) 102, temperature 98.6 F (37 C), temperature source Oral, resp. rate 18, height '5\' 8"'$  (1.727 m), weight 159 lb 14.4 oz (72.5 kg), SpO2 97 %.    HEENT: No thrush or ulcers Lymphatics: No cervical or supraclavicular nodes Resp: End inspiratory bronchial sounds at the upper posterior chest bilaterally, no respiratory distress Cardio: Regular rate and rhythm GI: Nontender, no mass, no hepatomegaly Vascular: No leg edema Neuro: Mild to moderate loss of vibratory sense at the fingertips     Portacath/PICC-without erythema  Lab Results:  Lab Results  Component Value Date   WBC 9.7 03/15/2017   HGB 10.1 (L) 03/01/2017   HCT 33.4 (L) 03/15/2017   MCV 91.8 03/15/2017   PLT 279 03/15/2017   NEUTROABS 6.8 (H) 03/15/2017    CMP     Component Value Date/Time   NA 140 03/15/2017 0801   NA 139 02/01/2017 0853   K 3.8 03/15/2017 0801   K 3.6 02/01/2017 0853   CL 107 03/15/2017 0801   CO2 22 03/15/2017 0801   CO2 25 02/01/2017 0853   GLUCOSE 106 03/15/2017 0801   GLUCOSE 102 02/01/2017 0853   BUN 19 03/15/2017 0801   BUN 18.3 02/01/2017 0853   CREATININE 0.83 03/15/2017 0801   CREATININE 0.7 02/01/2017 0853   CALCIUM 9.3 03/15/2017 0801   CALCIUM 8.8 02/01/2017 0853   PROT 6.8 03/15/2017 0801   PROT 6.0 (L) 02/01/2017 0853   ALBUMIN 2.9 (L) 03/15/2017 0801   ALBUMIN 2.3 (L) 02/01/2017 0853   AST 27 03/15/2017 0801   AST 31 02/01/2017 0853   ALT 16 03/15/2017 0801   ALT 41 02/01/2017 0853   ALKPHOS 162 (H)  03/15/2017 0801   ALKPHOS 308 (H) 02/01/2017 0853   BILITOT 0.3 03/15/2017 0801   BILITOT 0.23 02/01/2017 0853   GFRNONAA >60 03/15/2017 0801   GFRAA >60 03/15/2017 0801    Lab Results  Component Value Date   CEA1 769.22 (H) 03/15/2017      Imaging:  Ct Chest W Contrast  Result Date: 03/13/2017 CLINICAL DATA:  62 year old male with history of esophageal carcinoma diagnosed in November 2018. Undergoing ongoing chemotherapy. EXAM: CT CHEST, ABDOMEN, AND PELVIS WITH CONTRAST TECHNIQUE: Multidetector CT imaging of the chest, abdomen and pelvis was performed following the standard protocol during bolus administration of intravenous contrast. CONTRAST:  156m ISOVUE-300 IOPAMIDOL (ISOVUE-300) INJECTION 61% COMPARISON:  CT the chest, PET-CT 12/27/2016. CT the chest, abdomen and pelvis 12/16/2016. FINDINGS: CT CHEST FINDINGS Cardiovascular: Heart size is borderline enlarged. There is no significant pericardial fluid, thickening or pericardial calcification. There is aortic atherosclerosis, as well as atherosclerosis of the great vessels of the mediastinum and the coronary arteries, including calcified atherosclerotic plaque in the left anterior descending and right coronary arteries. Right internal jugular single-lumen porta cath with tip terminating in the distal superior vena cava. Mediastinum/Nodes: Extensive mediastinal and right hilar lymphadenopathy again noted, generally decreased compared to the prior study. The largest right hilar lymph node measures up to 16 mm in short axis (  axial image 28 of series 2), while the largest mediastinal lymph node is in the right paratracheal nodal station (axial image 21 of series 2) measuring 2.3 cm in short axis. Esophagus is unremarkable in appearance. No axillary lymphadenopathy. Lungs/Pleura: Increasing moderate partially loculated left pleural effusion with pleural enhancement. Trace right pleural effusion lying dependently, new compared to the prior study.  Patchy ground-glass attenuation, septal thickening, thickening of the peribronchovascular interstitium and regional architectural distortion most confluent in the right upper lobe, but also involving the right middle and lower lobes to a lesser extent, new compared to the prior study, presumably of infectious or inflammatory etiology. Musculoskeletal: There are no aggressive appearing lytic or blastic lesions noted in the visualized portions of the skeleton. CT ABDOMEN PELVIS FINDINGS Hepatobiliary: There are again 2 hypovascular lesions in the liver, compatible with metastatic lesions. The largest of these on the prior study in segment 5 has decreased substantially in size, currently measuring 1.5 x 2.0 cm (axial image 67 of series 2), previously 4.6 x 3.6 cm on 12/16/2016. The other smaller lesion more inferiorly between segments 5 and 6 (axial image 72 of series 2) on the prior study has increased in size on today's examination, currently measuring 2.7 x 2.0 cm (previously 1.5 cm). No new hepatic lesions are noted. No intra or extrahepatic biliary ductal dilatation. Status post cholecystectomy. Pancreas: No pancreatic mass. No pancreatic ductal dilatation. No pancreatic or peripancreatic fluid or inflammatory changes. Spleen: Unremarkable. Adrenals/Urinary Tract: Multiple subcentimeter low-attenuation lesions in both kidneys are too small to definitively characterize, but are statistically likely to represent tiny cysts. No hydroureteronephrosis. Urinary bladder is normal in appearance. Stomach/Bowel: Normal appearance of the stomach. No pathologic dilatation of small bowel or colon. Numerous colonic diverticulae are noted, without surrounding inflammatory changes to suggest an acute diverticulitis at this time. Normal appendix. Vascular/Lymphatic: Aortic atherosclerosis, without evidence of aneurysm or dissection in the abdominal or pelvic vasculature. The extensive abdominal and retroperitoneal lymphadenopathy  on the prior examination has significantly regressed on today's study. The largest residual retroperitoneal lymph node is in the left para-aortic nodal station adjacent to the left renal hilum measuring 12 mm in short axis (axial image 63 of series 2) decreased from 23 mm in short axis on the prior study. Previously noted portacaval nodal mass has completely regressed, now with only normal sized lymph nodes in the portacaval nodal station. Mesenteric lymphadenopathy has also regressed, now with multiple borderline enlarged mesenteric lymph nodes. No pelvic lymphadenopathy. Reproductive: Prostate gland and seminal vesicles are unremarkable in appearance. Other: Small bilateral inguinal hernias containing only fat. No significant volume of ascites. No pneumoperitoneum. Musculoskeletal: There are no aggressive appearing lytic or blastic lesions noted in the visualized portions of the skeleton. IMPRESSION: 1. Today's study demonstrates a general positive response to therapy with decreased number and size of numerous enlarged lymph nodes in mediastinal, right hilar, upper abdominal, retroperitoneal and mesenteric nodal stations, as detailed above. 2. Mixed response to therapy with respect to hepatic lesions, as the largest of these lesions on the prior study has regressed in size, while the smaller lesion on the prior study has slightly increased in size on today's examination. 3. Slight increase in partially loculated enhancing left pleural effusion which is moderate in size. Trace right pleural effusion. 4. Interval development of patchy multifocal interstitial and airspace disease most evident in the right upper lobe. This is presumably of infectious or inflammatory etiology. Attention on follow-up studies is recommended. The possibility of lymphangitic spread of disease  could also be considered, but is not strongly favored at this time. 5. Aortic atherosclerosis, in addition to 2 vessel coronary artery disease.  Please note that although the presence of coronary artery calcium documents the presence of coronary artery disease, the severity of this disease and any potential stenosis cannot be assessed on this non-gated CT examination. Assessment for potential risk factor modification, dietary therapy or pharmacologic therapy may be warranted, if clinically indicated. Aortic Atherosclerosis (ICD10-I70.0). Electronically Signed   By: Vinnie Langton M.D.   On: 03/13/2017 13:33   Ct Abdomen Pelvis W Contrast  Result Date: 03/13/2017 CLINICAL DATA:  62 year old male with history of esophageal carcinoma diagnosed in November 2018. Undergoing ongoing chemotherapy. EXAM: CT CHEST, ABDOMEN, AND PELVIS WITH CONTRAST TECHNIQUE: Multidetector CT imaging of the chest, abdomen and pelvis was performed following the standard protocol during bolus administration of intravenous contrast. CONTRAST:  160m ISOVUE-300 IOPAMIDOL (ISOVUE-300) INJECTION 61% COMPARISON:  CT the chest, PET-CT 12/27/2016. CT the chest, abdomen and pelvis 12/16/2016. FINDINGS: CT CHEST FINDINGS Cardiovascular: Heart size is borderline enlarged. There is no significant pericardial fluid, thickening or pericardial calcification. There is aortic atherosclerosis, as well as atherosclerosis of the great vessels of the mediastinum and the coronary arteries, including calcified atherosclerotic plaque in the left anterior descending and right coronary arteries. Right internal jugular single-lumen porta cath with tip terminating in the distal superior vena cava. Mediastinum/Nodes: Extensive mediastinal and right hilar lymphadenopathy again noted, generally decreased compared to the prior study. The largest right hilar lymph node measures up to 16 mm in short axis (axial image 28 of series 2), while the largest mediastinal lymph node is in the right paratracheal nodal station (axial image 21 of series 2) measuring 2.3 cm in short axis. Esophagus is unremarkable in  appearance. No axillary lymphadenopathy. Lungs/Pleura: Increasing moderate partially loculated left pleural effusion with pleural enhancement. Trace right pleural effusion lying dependently, new compared to the prior study. Patchy ground-glass attenuation, septal thickening, thickening of the peribronchovascular interstitium and regional architectural distortion most confluent in the right upper lobe, but also involving the right middle and lower lobes to a lesser extent, new compared to the prior study, presumably of infectious or inflammatory etiology. Musculoskeletal: There are no aggressive appearing lytic or blastic lesions noted in the visualized portions of the skeleton. CT ABDOMEN PELVIS FINDINGS Hepatobiliary: There are again 2 hypovascular lesions in the liver, compatible with metastatic lesions. The largest of these on the prior study in segment 5 has decreased substantially in size, currently measuring 1.5 x 2.0 cm (axial image 67 of series 2), previously 4.6 x 3.6 cm on 12/16/2016. The other smaller lesion more inferiorly between segments 5 and 6 (axial image 72 of series 2) on the prior study has increased in size on today's examination, currently measuring 2.7 x 2.0 cm (previously 1.5 cm). No new hepatic lesions are noted. No intra or extrahepatic biliary ductal dilatation. Status post cholecystectomy. Pancreas: No pancreatic mass. No pancreatic ductal dilatation. No pancreatic or peripancreatic fluid or inflammatory changes. Spleen: Unremarkable. Adrenals/Urinary Tract: Multiple subcentimeter low-attenuation lesions in both kidneys are too small to definitively characterize, but are statistically likely to represent tiny cysts. No hydroureteronephrosis. Urinary bladder is normal in appearance. Stomach/Bowel: Normal appearance of the stomach. No pathologic dilatation of small bowel or colon. Numerous colonic diverticulae are noted, without surrounding inflammatory changes to suggest an acute  diverticulitis at this time. Normal appendix. Vascular/Lymphatic: Aortic atherosclerosis, without evidence of aneurysm or dissection in the abdominal or pelvic vasculature.  The extensive abdominal and retroperitoneal lymphadenopathy on the prior examination has significantly regressed on today's study. The largest residual retroperitoneal lymph node is in the left para-aortic nodal station adjacent to the left renal hilum measuring 12 mm in short axis (axial image 63 of series 2) decreased from 23 mm in short axis on the prior study. Previously noted portacaval nodal mass has completely regressed, now with only normal sized lymph nodes in the portacaval nodal station. Mesenteric lymphadenopathy has also regressed, now with multiple borderline enlarged mesenteric lymph nodes. No pelvic lymphadenopathy. Reproductive: Prostate gland and seminal vesicles are unremarkable in appearance. Other: Small bilateral inguinal hernias containing only fat. No significant volume of ascites. No pneumoperitoneum. Musculoskeletal: There are no aggressive appearing lytic or blastic lesions noted in the visualized portions of the skeleton. IMPRESSION: 1. Today's study demonstrates a general positive response to therapy with decreased number and size of numerous enlarged lymph nodes in mediastinal, right hilar, upper abdominal, retroperitoneal and mesenteric nodal stations, as detailed above. 2. Mixed response to therapy with respect to hepatic lesions, as the largest of these lesions on the prior study has regressed in size, while the smaller lesion on the prior study has slightly increased in size on today's examination. 3. Slight increase in partially loculated enhancing left pleural effusion which is moderate in size. Trace right pleural effusion. 4. Interval development of patchy multifocal interstitial and airspace disease most evident in the right upper lobe. This is presumably of infectious or inflammatory etiology. Attention on  follow-up studies is recommended. The possibility of lymphangitic spread of disease could also be considered, but is not strongly favored at this time. 5. Aortic atherosclerosis, in addition to 2 vessel coronary artery disease. Please note that although the presence of coronary artery calcium documents the presence of coronary artery disease, the severity of this disease and any potential stenosis cannot be assessed on this non-gated CT examination. Assessment for potential risk factor modification, dietary therapy or pharmacologic therapy may be warranted, if clinically indicated. Aortic Atherosclerosis (ICD10-I70.0). Electronically Signed   By: Vinnie Langton M.D.   On: 03/13/2017 13:33    Medications: I have reviewed the patient's current medications.   Assessment/Plan: 1. Metastatic esophagus cancer ? Distal esophagus mass at upper endoscopy 12/15/2016, biopsy confirmed invasive poorly differentiated adenocarcinoma ? CTs of 02/16/2016-distal esophagus thickening, mediastinal/right hilar lymphadenopathy, abdominal/retroperitoneal lymphadenopathy, liver metastases ? MRI lumbar spine 12/20/2016-7 mm S1 lesion suspicious for metastasis, bulky retroperitoneal and prevertebral malignant lymphadenopathy, leftpsoasmuscle metastasis, degenerative disease causing severe neural foraminal stenosis at right L4 and left L5 ? PET scan 12/27/2016-hypermetabolic mass at the gastroesophageal junction; extensive hypermetabolic adenopathy involving the right supraclavicular nodal station, mediastinal lymph nodes, retrocrural lymph nodes, periaortic lymph nodes, left iliac lymph nodes and central mesenteric lymph nodes; fine nodularity in the right upper lobe with hypermetabolic thickening in the right suprahilar peribronchial structures; hypermetabolic metastasis to the right hepatic lobe; several small skeletal metastasis including right iliac bone, left sacrum and left scapula; multiple soft tissue metastases to the  subcutaneous tissues adjacent to the left scapular musculature, retroperitoneal nodal metastasis along the left psoas muscle and metastatic lesion within the left gluteal musculature. No lesion in the lumbar spine or spinal canal to explain the patient's acute right-sided leg pain. ? Ultrasound-guided biopsy of a right liver lesion 01/04/2017-poorly differentiated adenocarcinoma ? PD-L1 expression30% ? Cycle 1 FOLFOX 01/05/2017 ? Cycle 2 FOLFOX 01/19/2017 ? Cycle 3 FOLFOX1/03/2017 ? Cycle 4 FOLFOX 02/15/2017 ? Cycle 5 FOLFOX 03/01/2017 ? CTs 03/13/2017 decreased  size and number of lymph nodes in the chest, abdomen, and retroperitoneum.  Decreased size of largest liver lesion, slight increase in size of small liver lesion, slight increase in left pleural effusion, development of inflammatory appearing airspace disease ? Cycle 6 FOLFOX 03/15/2017 2. Painpossibly due to disc herniation-the pain may be related to retroperitoneal lymphadenopathy or bone metastases;plain x-ray right hip 12/21/2016 with no blastic or lytic bone lesions. No fracture or dislocation.Review of the lumbar spine MRI shows right L4 disc herniation which could be responsible for the right leg pain and weakness. Trial of dexamethasone initiatedwith improvement.  3.Rheumatoid arthritis  4.Dysphagia/weight loss secondary to #1-improved 02/01/2017.  5.Chronic left pleural effusion  6.Port-A-Cath placement 01/04/2017  7.  Early oxaliplatin neuropathy-diminished vibratory sense  8.  Right lung inflammatory changes noted on chest CT 03/13/2017    Disposition: Grant Todd has completed 5 cycles of FOLFOX.  There has been clinical, laboratory, and radiologic improvement.  I reviewed the CT images with him.  There is been a marked reduction in the metastatic lymphadenopathy.  He has a small left pleural effusion and inflammatory appearing airspace disease.  He has a chronic history of a left pleural effusion.  I  suspect the effusion and airspace changes are unrelated to esophagus cancer.  The plan is to continue FOLFOX.  He will contact us for a fever or respiratory symptoms.  He will return for an office visit and chemotherapy in 2 weeks.  He will complete 8-9 cycles of FOLFOX and then switch to a maintenance regimen.  25 minutes were spent with the patient today.  The majority of the time was used for counseling and coordination of care.  Betsy Coder, MD  03/15/2017  1:59 PM

## 2017-03-15 NOTE — Telephone Encounter (Signed)
Scheduled appt per 2/13 los - patient to get an updated schedule next visit.  

## 2017-03-17 ENCOUNTER — Inpatient Hospital Stay: Payer: No Typology Code available for payment source

## 2017-03-17 VITALS — BP 115/80 | HR 85 | Temp 98.0°F | Resp 18

## 2017-03-17 DIAGNOSIS — C155 Malignant neoplasm of lower third of esophagus: Secondary | ICD-10-CM

## 2017-03-17 DIAGNOSIS — Z5111 Encounter for antineoplastic chemotherapy: Secondary | ICD-10-CM | POA: Diagnosis not present

## 2017-03-17 MED ORDER — SODIUM CHLORIDE 0.9% FLUSH
10.0000 mL | INTRAVENOUS | Status: DC | PRN
  Administered 2017-03-17: 10 mL
  Filled 2017-03-17: qty 10

## 2017-03-17 MED ORDER — HEPARIN SOD (PORK) LOCK FLUSH 100 UNIT/ML IV SOLN
500.0000 [IU] | Freq: Once | INTRAVENOUS | Status: AC | PRN
  Administered 2017-03-17: 500 [IU]
  Filled 2017-03-17: qty 5

## 2017-03-26 ENCOUNTER — Other Ambulatory Visit: Payer: Self-pay | Admitting: Oncology

## 2017-03-29 ENCOUNTER — Encounter: Payer: Self-pay | Admitting: Nurse Practitioner

## 2017-03-29 ENCOUNTER — Inpatient Hospital Stay (HOSPITAL_BASED_OUTPATIENT_CLINIC_OR_DEPARTMENT_OTHER): Payer: No Typology Code available for payment source | Admitting: Nurse Practitioner

## 2017-03-29 ENCOUNTER — Inpatient Hospital Stay: Payer: No Typology Code available for payment source

## 2017-03-29 ENCOUNTER — Telehealth: Payer: Self-pay | Admitting: Nurse Practitioner

## 2017-03-29 VITALS — BP 133/83 | HR 82 | Temp 98.3°F | Resp 18 | Ht 68.0 in | Wt 164.3 lb

## 2017-03-29 DIAGNOSIS — C7951 Secondary malignant neoplasm of bone: Secondary | ICD-10-CM | POA: Diagnosis not present

## 2017-03-29 DIAGNOSIS — I2510 Atherosclerotic heart disease of native coronary artery without angina pectoris: Secondary | ICD-10-CM | POA: Diagnosis not present

## 2017-03-29 DIAGNOSIS — C155 Malignant neoplasm of lower third of esophagus: Secondary | ICD-10-CM

## 2017-03-29 DIAGNOSIS — R0600 Dyspnea, unspecified: Secondary | ICD-10-CM

## 2017-03-29 DIAGNOSIS — Z5111 Encounter for antineoplastic chemotherapy: Secondary | ICD-10-CM | POA: Diagnosis not present

## 2017-03-29 DIAGNOSIS — I700 Atherosclerosis of aorta: Secondary | ICD-10-CM

## 2017-03-29 DIAGNOSIS — M5126 Other intervertebral disc displacement, lumbar region: Secondary | ICD-10-CM | POA: Diagnosis not present

## 2017-03-29 DIAGNOSIS — M069 Rheumatoid arthritis, unspecified: Secondary | ICD-10-CM

## 2017-03-29 DIAGNOSIS — Z79899 Other long term (current) drug therapy: Secondary | ICD-10-CM

## 2017-03-29 DIAGNOSIS — J90 Pleural effusion, not elsewhere classified: Secondary | ICD-10-CM | POA: Diagnosis not present

## 2017-03-29 DIAGNOSIS — R05 Cough: Secondary | ICD-10-CM | POA: Diagnosis not present

## 2017-03-29 DIAGNOSIS — C787 Secondary malignant neoplasm of liver and intrahepatic bile duct: Secondary | ICD-10-CM

## 2017-03-29 DIAGNOSIS — Z95828 Presence of other vascular implants and grafts: Secondary | ICD-10-CM

## 2017-03-29 LAB — CMP (CANCER CENTER ONLY)
ALT: 20 U/L (ref 0–55)
AST: 27 U/L (ref 5–34)
Albumin: 2.9 g/dL — ABNORMAL LOW (ref 3.5–5.0)
Alkaline Phosphatase: 172 U/L — ABNORMAL HIGH (ref 40–150)
Anion gap: 8 (ref 3–11)
BILIRUBIN TOTAL: 0.3 mg/dL (ref 0.2–1.2)
BUN: 18 mg/dL (ref 7–26)
CHLORIDE: 108 mmol/L (ref 98–109)
CO2: 22 mmol/L (ref 22–29)
CREATININE: 0.75 mg/dL (ref 0.70–1.30)
Calcium: 9.3 mg/dL (ref 8.4–10.4)
GFR, Est AFR Am: >60 mL/min (ref >60)
Glucose, Bld: 99 mg/dL (ref 70–140)
Potassium: 3.6 mmol/L (ref 3.5–5.1)
Sodium: 138 mmol/L (ref 136–145)
TOTAL PROTEIN: 6.8 g/dL (ref 6.4–8.3)

## 2017-03-29 LAB — CBC WITH DIFFERENTIAL (CANCER CENTER ONLY)
Basophils Absolute: 0.1 10*3/uL (ref 0.0–0.1)
Basophils Relative: 1 %
EOS ABS: 0.3 10*3/uL (ref 0.0–0.5)
EOS PCT: 3 %
HCT: 34.5 % — ABNORMAL LOW (ref 38.4–49.9)
HEMOGLOBIN: 10.6 g/dL — AB (ref 13.0–17.1)
LYMPHS ABS: 1.9 10*3/uL (ref 0.9–3.3)
Lymphocytes Relative: 18 %
MCH: 28.4 pg (ref 27.2–33.4)
MCHC: 30.7 g/dL — ABNORMAL LOW (ref 32.0–36.0)
MCV: 92.5 fL (ref 79.3–98.0)
Monocytes Absolute: 1.1 10*3/uL — ABNORMAL HIGH (ref 0.1–0.9)
Monocytes Relative: 11 %
Neutro Abs: 7.2 10*3/uL — ABNORMAL HIGH (ref 1.5–6.5)
Neutrophils Relative %: 67 %
PLATELETS: 244 10*3/uL (ref 140–400)
RBC: 3.73 MIL/uL — AB (ref 4.20–5.82)
RDW: 20.5 % — ABNORMAL HIGH (ref 11.0–14.6)
WBC: 10.6 10*3/uL — AB (ref 4.0–10.3)

## 2017-03-29 MED ORDER — DEXAMETHASONE SODIUM PHOSPHATE 10 MG/ML IJ SOLN
10.0000 mg | Freq: Once | INTRAMUSCULAR | Status: AC
  Administered 2017-03-29: 10 mg via INTRAVENOUS

## 2017-03-29 MED ORDER — LEUCOVORIN CALCIUM INJECTION 350 MG
400.0000 mg/m2 | Freq: Once | INTRAVENOUS | Status: AC
  Administered 2017-03-29: 704 mg via INTRAVENOUS
  Filled 2017-03-29: qty 35.2

## 2017-03-29 MED ORDER — FLUOROURACIL CHEMO INJECTION 5 GM/100ML
2400.0000 mg/m2 | INTRAVENOUS | Status: DC
  Administered 2017-03-29: 4200 mg via INTRAVENOUS
  Filled 2017-03-29: qty 84

## 2017-03-29 MED ORDER — PALONOSETRON HCL INJECTION 0.25 MG/5ML
INTRAVENOUS | Status: AC
  Filled 2017-03-29: qty 5

## 2017-03-29 MED ORDER — PALONOSETRON HCL INJECTION 0.25 MG/5ML
0.2500 mg | Freq: Once | INTRAVENOUS | Status: AC
  Administered 2017-03-29: 0.25 mg via INTRAVENOUS

## 2017-03-29 MED ORDER — DEXTROSE 5 % IV SOLN
Freq: Once | INTRAVENOUS | Status: AC
  Administered 2017-03-29: 10:00:00 via INTRAVENOUS

## 2017-03-29 MED ORDER — OXALIPLATIN CHEMO INJECTION 100 MG/20ML
85.0000 mg/m2 | Freq: Once | INTRAVENOUS | Status: AC
  Administered 2017-03-29: 150 mg via INTRAVENOUS
  Filled 2017-03-29: qty 10

## 2017-03-29 MED ORDER — DEXAMETHASONE SODIUM PHOSPHATE 10 MG/ML IJ SOLN
INTRAMUSCULAR | Status: AC
  Filled 2017-03-29: qty 1

## 2017-03-29 MED ORDER — SODIUM CHLORIDE 0.9% FLUSH
10.0000 mL | Freq: Once | INTRAVENOUS | Status: AC
  Administered 2017-03-29: 10 mL
  Filled 2017-03-29: qty 10

## 2017-03-29 NOTE — Telephone Encounter (Signed)
Scheduled appt per 2/27 los - patient aware of appt time and date added - my chart active.

## 2017-03-29 NOTE — Progress Notes (Addendum)
**Grant Grant via Obfuscation** Grant Grant   Diagnosis: Esophagus cancer  INTERVAL HISTORY:   Grant Grant returns as scheduled.  He completed cycle 6 FOLFOX 03/15/2017.  He denies nausea/vomiting.  Mouth became "sensitive" following the chemotherapy.  No ulcers.  He was able to eat and drink without difficulty.  No diarrhea.  Cold sensitivity lasted about 12 days.  No persistent neuropathy symptoms.  He notes an increased cough at nighttime.  He has stable exertional dyspnea.  No fever.  Main complaint is fatigue.  Objective:  Vital signs in last 24 hours:  Blood pressure 133/83, pulse 82, temperature 98.3 F (36.8 C), temperature source Oral, resp. rate 18, height '5\' 8"'$  (1.727 m), weight 164 lb 4.8 oz (74.5 kg), SpO2 97 %.    HEENT: No thrush or ulcers. Resp: Rub throughout the left lung field.  No respiratory distress. Cardio: Regular rate and rhythm. GI: Abdomen soft and nontender.  No hepatomegaly. Vascular: No leg edema.  Calves soft and nontender. Neuro: Vibratory sense minimally decreased to intact over the fingertips for tuning fork exam. Skin: Palms without erythema. Port-A-Cath without erythema.   Lab Results:  Lab Results  Component Value Date   WBC 10.6 (H) 03/29/2017   HGB 10.1 (L) 03/01/2017   HCT 34.5 (L) 03/29/2017   MCV 92.5 03/29/2017   PLT 244 03/29/2017   NEUTROABS 7.2 (H) 03/29/2017    Imaging:  No results found.  Medications: I have reviewed the patient's current medications.  Assessment/Plan: 1. Metastatic esophagus cancer ? Distal esophagus mass at upper endoscopy 12/15/2016, biopsy confirmed invasive poorly differentiated adenocarcinoma ? CTs of 02/16/2016-distal esophagus thickening, mediastinal/right hilar lymphadenopathy, abdominal/retroperitoneal lymphadenopathy, liver metastases ? MRI lumbar spine 12/20/2016-7 mm S1 lesion suspicious for metastasis, bulky retroperitoneal and prevertebral malignant lymphadenopathy, leftpsoasmuscle  metastasis, degenerative disease causing severe neural foraminal stenosis at right L4 and left L5 ? PET scan 12/27/2016-hypermetabolic mass at the gastroesophageal junction; extensive hypermetabolic adenopathy involving the right supraclavicular nodal station, mediastinal lymph nodes, retrocrural lymph nodes, periaortic lymph nodes, left iliac lymph nodes and central mesenteric lymph nodes; fine nodularity in the right upper lobe with hypermetabolic thickening in the right suprahilar peribronchial structures; hypermetabolic metastasis to the right hepatic lobe; several small skeletal metastasis including right iliac bone, left sacrum and left scapula; multiple soft tissue metastases to the subcutaneous tissues adjacent to the left scapular musculature, retroperitoneal nodal metastasis along the left psoas muscle and metastatic lesion within the left gluteal musculature. No lesion in the lumbar spine or spinal canal to explain the patient's acute right-sided leg pain. ? Ultrasound-guided biopsy of a right liver lesion 01/04/2017-poorly differentiated adenocarcinoma ? PD-L1 expression30% ? Cycle 1 FOLFOX 01/05/2017 ? Cycle 2 FOLFOX 01/19/2017 ? Cycle 3 FOLFOX1/03/2017 ? Cycle 4 FOLFOX 02/15/2017 ? Cycle 5 FOLFOX 03/01/2017 ? CTs 03/13/2017 decreased size and number of lymph nodes in the chest, abdomen, and retroperitoneum.  Decreased size of largest liver lesion, slight increase in size of small liver lesion, slight increase in left pleural effusion, development of inflammatory appearing airspace disease ? Cycle 6 FOLFOX 03/15/2017 ? Cycle 7 FOLFOX 03/29/2017 2. Painpossibly due to disc herniation-the pain may be related to retroperitoneal lymphadenopathy or bone metastases;plain x-ray right hip 12/21/2016 with no blastic or lytic bone lesions. No fracture or dislocation.Review of the lumbar spine MRI shows right L4 disc herniation which could be responsible for the right leg pain and weakness. Trial of  dexamethasone initiatedwith improvement.  3.Rheumatoid arthritis  4.Dysphagia/weight loss secondary to #1-improved 02/01/2017.  5.Chronic left pleural effusion  6.Port-A-Cath placement 01/04/2017  7.  Early oxaliplatin neuropathy-diminished vibratory sense; mildly decreased to intact 03/29/2017.  8.  Right lung inflammatory changes noted on chest CT 03/13/2017   Disposition: Grant Grant appears stable.  He has completed 6 cycles of FOLFOX.  Plan to proceed with cycle 7 today as scheduled.  We reviewed the lab results from today.  He has noted an increased cough since his last visit.  No fever.  We reviewed the CT images from 03/13/2017 at today's visit.  Left pleural effusion and inflammatory airspace disease mainly in the right lung.  Plan to monitor for now.  He understands to contact the office with worsening cough, fever, shortness of breath.  He will return for lab, follow-up and cycle 8 FOLFOX in 2 weeks.  He will contact the office in the interim as outlined above or with any other problems.  Patient seen with Dr. Benay Spice.  25 minutes were spent face-to-face at today's visit with the majority of that time involved in counseling/coordination of care.      Ned Card ANP/GNP-BC   03/29/2017  8:54 AM  This was a shared visit with Ned Card.  Grant Grant was interviewed and examined.  The cough may be related to a viral upper respiratory infection or chronic lung disease.  We have a low clinical suspicion for pneumonia.  He will contact us if his symptoms worsen.  The plan is to proceed with chemotherapy today.  Julieanne Manson, MD

## 2017-03-29 NOTE — Patient Instructions (Signed)
[facility] Discharge Instructions for Patients Receiving Chemotherapy  Today you received the following chemotherapy agents Oxaliplatin, Leucovorin, Flurouracil   To help prevent nausea and vomiting after your treatment, we encourage you to take your nausea medication as directed   If you develop nausea and vomiting that is not controlled by your nausea medication, call the clinic.   BELOW ARE SYMPTOMS THAT SHOULD BE REPORTED IMMEDIATELY:  *FEVER GREATER THAN 100.5 F  *CHILLS WITH OR WITHOUT FEVER  NAUSEA AND VOMITING THAT IS NOT CONTROLLED WITH YOUR NAUSEA MEDICATION  *UNUSUAL SHORTNESS OF BREATH  *UNUSUAL BRUISING OR BLEEDING  TENDERNESS IN MOUTH AND THROAT WITH OR WITHOUT PRESENCE OF ULCERS  *URINARY PROBLEMS  *BOWEL PROBLEMS  UNUSUAL RASH Items with * indicate a potential emergency and should be followed up as soon as possible.  Feel free to call the clinic should you have any questions or concerns. The clinic phone number is [phone].  Please show the [place] at check-in to the Emergency Department and triage nurse.

## 2017-03-31 ENCOUNTER — Inpatient Hospital Stay: Payer: No Typology Code available for payment source | Attending: Oncology

## 2017-03-31 VITALS — BP 124/73 | HR 83 | Temp 98.2°F | Resp 18

## 2017-03-31 DIAGNOSIS — C778 Secondary and unspecified malignant neoplasm of lymph nodes of multiple regions: Secondary | ICD-10-CM | POA: Insufficient documentation

## 2017-03-31 DIAGNOSIS — R634 Abnormal weight loss: Secondary | ICD-10-CM | POA: Insufficient documentation

## 2017-03-31 DIAGNOSIS — C155 Malignant neoplasm of lower third of esophagus: Secondary | ICD-10-CM | POA: Insufficient documentation

## 2017-03-31 DIAGNOSIS — C787 Secondary malignant neoplasm of liver and intrahepatic bile duct: Secondary | ICD-10-CM | POA: Insufficient documentation

## 2017-03-31 DIAGNOSIS — Z9981 Dependence on supplemental oxygen: Secondary | ICD-10-CM | POA: Diagnosis not present

## 2017-03-31 DIAGNOSIS — Z5111 Encounter for antineoplastic chemotherapy: Secondary | ICD-10-CM | POA: Diagnosis present

## 2017-03-31 DIAGNOSIS — J910 Malignant pleural effusion: Secondary | ICD-10-CM | POA: Insufficient documentation

## 2017-03-31 DIAGNOSIS — M069 Rheumatoid arthritis, unspecified: Secondary | ICD-10-CM | POA: Insufficient documentation

## 2017-03-31 DIAGNOSIS — R079 Chest pain, unspecified: Secondary | ICD-10-CM | POA: Insufficient documentation

## 2017-03-31 DIAGNOSIS — R05 Cough: Secondary | ICD-10-CM | POA: Insufficient documentation

## 2017-03-31 DIAGNOSIS — J90 Pleural effusion, not elsewhere classified: Secondary | ICD-10-CM | POA: Diagnosis not present

## 2017-03-31 DIAGNOSIS — R0600 Dyspnea, unspecified: Secondary | ICD-10-CM | POA: Insufficient documentation

## 2017-03-31 DIAGNOSIS — Z79899 Other long term (current) drug therapy: Secondary | ICD-10-CM | POA: Insufficient documentation

## 2017-03-31 MED ORDER — SODIUM CHLORIDE 0.9% FLUSH
10.0000 mL | INTRAVENOUS | Status: DC | PRN
  Administered 2017-03-31: 10 mL
  Filled 2017-03-31: qty 10

## 2017-03-31 MED ORDER — HEPARIN SOD (PORK) LOCK FLUSH 100 UNIT/ML IV SOLN
500.0000 [IU] | Freq: Once | INTRAVENOUS | Status: AC | PRN
  Administered 2017-03-31: 500 [IU]
  Filled 2017-03-31: qty 5

## 2017-04-04 ENCOUNTER — Other Ambulatory Visit: Payer: Self-pay | Admitting: Oncology

## 2017-04-06 ENCOUNTER — Other Ambulatory Visit: Payer: Self-pay | Admitting: Oncology

## 2017-04-12 ENCOUNTER — Inpatient Hospital Stay: Payer: No Typology Code available for payment source

## 2017-04-12 ENCOUNTER — Inpatient Hospital Stay (HOSPITAL_BASED_OUTPATIENT_CLINIC_OR_DEPARTMENT_OTHER): Payer: No Typology Code available for payment source | Admitting: Nurse Practitioner

## 2017-04-12 ENCOUNTER — Telehealth: Payer: Self-pay | Admitting: Nurse Practitioner

## 2017-04-12 ENCOUNTER — Encounter: Payer: Self-pay | Admitting: Nurse Practitioner

## 2017-04-12 ENCOUNTER — Ambulatory Visit (HOSPITAL_COMMUNITY)
Admission: RE | Admit: 2017-04-12 | Discharge: 2017-04-12 | Disposition: A | Discharge status: Home / Self Care | Payer: PRIVATE HEALTH INSURANCE | Source: Ambulatory Visit | Attending: Nurse Practitioner | Admitting: Nurse Practitioner

## 2017-04-12 ENCOUNTER — Ambulatory Visit: Payer: PRIVATE HEALTH INSURANCE

## 2017-04-12 VITALS — BP 117/76 | HR 84 | Temp 97.8°F | Resp 20 | Ht 68.0 in | Wt 164.4 lb

## 2017-04-12 DIAGNOSIS — J90 Pleural effusion, not elsewhere classified: Secondary | ICD-10-CM | POA: Diagnosis not present

## 2017-04-12 DIAGNOSIS — C155 Malignant neoplasm of lower third of esophagus: Secondary | ICD-10-CM

## 2017-04-12 DIAGNOSIS — J989 Respiratory disorder, unspecified: Secondary | ICD-10-CM | POA: Diagnosis not present

## 2017-04-12 DIAGNOSIS — Z95828 Presence of other vascular implants and grafts: Secondary | ICD-10-CM

## 2017-04-12 DIAGNOSIS — Z5111 Encounter for antineoplastic chemotherapy: Secondary | ICD-10-CM | POA: Diagnosis not present

## 2017-04-12 LAB — CMP (CANCER CENTER ONLY)
ALT: 29 U/L (ref 0–55)
AST: 39 U/L — AB (ref 5–34)
Albumin: 2.9 g/dL — ABNORMAL LOW (ref 3.5–5.0)
Alkaline Phosphatase: 173 U/L — ABNORMAL HIGH (ref 40–150)
Anion gap: 8 (ref 3–11)
BILIRUBIN TOTAL: 0.3 mg/dL (ref 0.2–1.2)
BUN: 18 mg/dL (ref 7–26)
CHLORIDE: 108 mmol/L (ref 98–109)
CO2: 23 mmol/L (ref 22–29)
CREATININE: 0.86 mg/dL (ref 0.70–1.30)
Calcium: 9.6 mg/dL (ref 8.4–10.4)
GFR, Estimated: >60 mL/min (ref >60)
Glucose, Bld: 102 mg/dL (ref 70–140)
POTASSIUM: 3.7 mmol/L (ref 3.5–5.1)
Sodium: 139 mmol/L (ref 136–145)
TOTAL PROTEIN: 7.2 g/dL (ref 6.4–8.3)

## 2017-04-12 LAB — CBC WITH DIFFERENTIAL (CANCER CENTER ONLY)
BASOS ABS: 0.1 10*3/uL (ref 0.0–0.1)
Basophils Relative: 1 %
EOS PCT: 4 %
Eosinophils Absolute: 0.3 10*3/uL (ref 0.0–0.5)
HEMATOCRIT: 34.7 % — AB (ref 38.4–49.9)
Hemoglobin: 11.1 g/dL — ABNORMAL LOW (ref 13.0–17.1)
LYMPHS PCT: 12 %
Lymphs Abs: 1.1 10*3/uL (ref 0.9–3.3)
MCH: 29 pg (ref 27.2–33.4)
MCHC: 32.1 g/dL (ref 32.0–36.0)
MCV: 90.5 fL (ref 79.3–98.0)
MONO ABS: 0.9 10*3/uL (ref 0.1–0.9)
Monocytes Relative: 10 %
NEUTROS ABS: 6.9 10*3/uL — AB (ref 1.5–6.5)
Neutrophils Relative %: 73 %
PLATELETS: 243 10*3/uL (ref 140–400)
RBC: 3.83 MIL/uL — ABNORMAL LOW (ref 4.20–5.82)
RDW: 20.7 % — AB (ref 11.0–14.6)
WBC Count: 9.3 10*3/uL (ref 4.0–10.3)

## 2017-04-12 LAB — CEA (IN HOUSE-CHCC): CEA (CHCC-In House): 1045.42 ng/mL — ABNORMAL HIGH (ref 0.00–5.00)

## 2017-04-12 MED ORDER — SODIUM CHLORIDE 0.9% FLUSH
10.0000 mL | Freq: Once | INTRAVENOUS | Status: AC
  Administered 2017-04-12: 10 mL
  Filled 2017-04-12: qty 10

## 2017-04-12 MED ORDER — HEPARIN SOD (PORK) LOCK FLUSH 100 UNIT/ML IV SOLN
500.0000 [IU] | Freq: Once | INTRAVENOUS | Status: AC
  Administered 2017-04-12: 500 [IU]
  Filled 2017-04-12: qty 5

## 2017-04-12 MED ORDER — LEVOFLOXACIN 500 MG PO TABS: 500.0000 mg | ORAL_TABLET | Freq: Every day | ORAL | 0 refills | Status: DC

## 2017-04-12 NOTE — Telephone Encounter (Signed)
Per 3/13 los - unable to schedule los due to capped day - logged patient is aware and will be called when appts scheduled.

## 2017-04-12 NOTE — Progress Notes (Signed)
[facility] PROGRESS NOTE   Diagnosis: Esophagus cancer  INTERVAL HISTORY:   [name] returns as scheduled.  He completed cycle 7 FOLFOX 03/29/2017.  He denies nausea/vomiting.  No mouth sores.  He does note mouth sensitivity after the chemotherapy.  No diarrhea.  Cold sensitivity lasted about 5 days.  He noted mild numbness in the left hand until a few days ago.  He reports an increased cough, mainly dry.  No fever.  He has dyspnea on exertion.  Objective:  Vital signs in last 24 hours:  Blood pressure 117/76, pulse 84, temperature 97.8 F (36.6 C), temperature source Oral, resp. rate 20, height '5\' 8"'$  (1.727 m), weight 164 lb 6.4 oz (74.6 kg), SpO2 96 %.   He appears well. HEENT: No thrush or ulcers. Resp: Rub throughout the left lung field.  No respiratory distress. Cardio: Regular rate and rhythm. GI: Abdomen soft and nontender.  No hepatomegaly. Vascular: No leg edema.  Calves soft and nontender. Neuro: Vibratory sense mildly decreased over the fingertips per tuning fork exam. Skin: Palms without erythema. Port-A-Cath without erythema.   Lab Results:  Lab Results  Component Value Date   WBC 9.3 04/12/2017   HGB 10.1 (L) 03/01/2017   HCT 34.7 (L) 04/12/2017   MCV 90.5 04/12/2017   PLT 243 04/12/2017   NEUTROABS 6.9 (H) 04/12/2017    Imaging:  No results found.  Medications: I have reviewed the patient's current medications.  Assessment/Plan: 1. Metastatic esophagus cancer ? Distal esophagus mass at upper endoscopy 12/15/2016, biopsy confirmed invasive poorly differentiated adenocarcinoma ? CTs of 02/16/2016-distal esophagus thickening, mediastinal/right hilar lymphadenopathy, abdominal/retroperitoneal lymphadenopathy, liver metastases ? MRI lumbar spine 12/20/2016-7 mm S1 lesion suspicious for metastasis, bulky retroperitoneal and prevertebral malignant lymphadenopathy, leftpsoasmuscle metastasis, degenerative disease causing severe neural  foraminal stenosis at right L4 and left L5 ? PET scan 12/27/2016-hypermetabolic mass at the gastroesophageal junction; extensive hypermetabolic adenopathy involving the right supraclavicular nodal station, mediastinal lymph nodes, retrocrural lymph nodes, periaortic lymph nodes, left iliac lymph nodes and central mesenteric lymph nodes; fine nodularity in the right upper lobe with hypermetabolic thickening in the right suprahilar peribronchial structures; hypermetabolic metastasis to the right hepatic lobe; several small skeletal metastasis including right iliac bone, left sacrum and left scapula; multiple soft tissue metastases to the subcutaneous tissues adjacent to the left scapular musculature, retroperitoneal nodal metastasis along the left psoas muscle and metastatic lesion within the left gluteal musculature. No lesion in the lumbar spine or spinal canal to explain the patient's acute right-sided leg pain. ? Ultrasound-guided biopsy of a right liver lesion 01/04/2017-poorly differentiated adenocarcinoma ? PD-L1 expression30% ? Cycle 1 FOLFOX 01/05/2017 ? Cycle 2 FOLFOX 01/19/2017 ? Cycle 3 FOLFOX1/03/2017 ? Cycle 4 FOLFOX 02/15/2017 ? Cycle 5 FOLFOX 03/01/2017 ? CTs 03/13/2017 decreased size and number of lymph nodes in the chest, abdomen, and retroperitoneum. Decreased size of largest liver lesion, slight increase in size of small liver lesion, slight increase in left pleural effusion, development of inflammatory appearing airspace disease ? Cycle 6 FOLFOX 03/15/2017 ? Cycle 7 FOLFOX 03/29/2017 2. Painpossibly due to disc herniation-the pain may be related to retroperitoneal lymphadenopathy or bone metastases;plain x-ray right hip 12/21/2016 with no blastic or lytic bone lesions. No fracture or dislocation.Review of the lumbar spine MRI shows right L4 disc herniation which could be responsible for the right leg pain and weakness. Trial of dexamethasone initiatedwith  improvement.  3.Rheumatoid arthritis  4.Dysphagia/weight loss secondary to #1-improved 02/01/2017.  5.Chronic left pleural effusion  6.Port-A-Cath placement 01/04/2017  7.Early oxaliplatin neuropathy-diminished vibratory sense; mildly decreased to intact 03/29/2017.  8.Right lung inflammatory changes noted on chest CT 03/13/2017; increased cough 04/12/2017.  Chest x-ray 04/12/2017 with similar appearance left chronic pleural effusion.  Diffuse airspace disease right lung similar to prior chest CT.     Disposition: [facility] Todd appears stable.  He has completed 7 cycles of FOLFOX.  He is seen today in routine follow-up prior to proceeding with cycle 8.  He has a persistent/worsening cough.  Chest x-ray shows diffuse airspace disease right lung felt to be similar to the recent chest CT.  We decided to hold today's treatment.  He will complete a 7-day course of Levaquin.  We will see him in follow-up in 1 week to reevaluate, sooner if needed.  He will contact the Todd if current symptoms worsen or he develops fever, chills.  25 minutes were spent face-to-face at today's visit with the majority of that time involved in counseling/coordination of care.    [facility] ANP/GNP-BC   04/12/2017  10:18 AM

## 2017-04-13 ENCOUNTER — Telehealth: Payer: Self-pay | Admitting: Nurse Practitioner

## 2017-04-13 NOTE — Telephone Encounter (Signed)
Scheduled appt per 3/13 los - patient is aware of appt date and time - called and confirmed - my chart active.

## 2017-04-17 ENCOUNTER — Inpatient Hospital Stay: Payer: No Typology Code available for payment source

## 2017-04-17 ENCOUNTER — Encounter: Payer: Self-pay | Admitting: Nurse Practitioner

## 2017-04-17 ENCOUNTER — Inpatient Hospital Stay (HOSPITAL_BASED_OUTPATIENT_CLINIC_OR_DEPARTMENT_OTHER): Payer: No Typology Code available for payment source | Admitting: Nurse Practitioner

## 2017-04-17 ENCOUNTER — Telehealth: Payer: Self-pay | Admitting: Oncology

## 2017-04-17 ENCOUNTER — Other Ambulatory Visit: Payer: Self-pay | Admitting: *Deleted

## 2017-04-17 VITALS — BP 111/72 | HR 104 | Temp 97.7°F | Resp 18 | Ht 68.0 in | Wt 164.3 lb

## 2017-04-17 DIAGNOSIS — C787 Secondary malignant neoplasm of liver and intrahepatic bile duct: Secondary | ICD-10-CM

## 2017-04-17 DIAGNOSIS — C155 Malignant neoplasm of lower third of esophagus: Secondary | ICD-10-CM

## 2017-04-17 DIAGNOSIS — C778 Secondary and unspecified malignant neoplasm of lymph nodes of multiple regions: Secondary | ICD-10-CM

## 2017-04-17 DIAGNOSIS — Z95828 Presence of other vascular implants and grafts: Secondary | ICD-10-CM

## 2017-04-17 DIAGNOSIS — R05 Cough: Secondary | ICD-10-CM | POA: Diagnosis not present

## 2017-04-17 DIAGNOSIS — Z5111 Encounter for antineoplastic chemotherapy: Secondary | ICD-10-CM | POA: Diagnosis not present

## 2017-04-17 LAB — CBC WITH DIFFERENTIAL (CANCER CENTER ONLY)
BASOS PCT: 0 %
Basophils Absolute: 0 10*3/uL (ref 0.0–0.1)
EOS ABS: 0.3 10*3/uL (ref 0.0–0.5)
EOS PCT: 2 %
HCT: 39 % (ref 38.4–49.9)
Hemoglobin: 12.1 g/dL — ABNORMAL LOW (ref 13.0–17.1)
Lymphocytes Relative: 13 %
Lymphs Abs: 1.3 10*3/uL (ref 0.9–3.3)
MCH: 28.9 pg (ref 27.2–33.4)
MCHC: 31 g/dL — AB (ref 32.0–36.0)
MCV: 93.1 fL (ref 79.3–98.0)
MONOS PCT: 7 %
Monocytes Absolute: 0.7 10*3/uL (ref 0.1–0.9)
NEUTROS PCT: 78 %
Neutro Abs: 8.3 10*3/uL — ABNORMAL HIGH (ref 1.5–6.5)
PLATELETS: 310 10*3/uL (ref 140–400)
RBC: 4.19 MIL/uL — ABNORMAL LOW (ref 4.20–5.82)
RDW: 20.3 % — AB (ref 11.0–14.6)
WBC Count: 10.6 10*3/uL — ABNORMAL HIGH (ref 4.0–10.3)

## 2017-04-17 LAB — CMP (CANCER CENTER ONLY)
ALK PHOS: 254 U/L — AB (ref 40–150)
ALT: 31 U/L (ref 0–55)
AST: 40 U/L — ABNORMAL HIGH (ref 5–34)
Albumin: 2.9 g/dL — ABNORMAL LOW (ref 3.5–5.0)
Anion gap: 10 (ref 3–11)
BUN: 22 mg/dL (ref 7–26)
CALCIUM: 10 mg/dL (ref 8.4–10.4)
CO2: 22 mmol/L (ref 22–29)
CREATININE: 0.85 mg/dL (ref 0.70–1.30)
Chloride: 107 mmol/L (ref 98–109)
Glucose, Bld: 113 mg/dL (ref 70–140)
Potassium: 4 mmol/L (ref 3.5–5.1)
Sodium: 139 mmol/L (ref 136–145)
Total Bilirubin: 0.3 mg/dL (ref 0.2–1.2)
Total Protein: 7.6 g/dL (ref 6.4–8.3)

## 2017-04-17 MED ORDER — SODIUM CHLORIDE 0.9 % IV SOLN
2400.0000 mg/m2 | INTRAVENOUS | Status: DC
  Administered 2017-04-17: 4200 mg via INTRAVENOUS
  Filled 2017-04-17: qty 84

## 2017-04-17 MED ORDER — SODIUM CHLORIDE 0.9% FLUSH
10.0000 mL | Freq: Once | INTRAVENOUS | Status: AC
  Administered 2017-04-17: 10 mL
  Filled 2017-04-17: qty 10

## 2017-04-17 NOTE — Progress Notes (Addendum)
[facility] OFFICE PROGRESS NOTE   Diagnosis: Esophagus cancer  INTERVAL HISTORY:   Grant Todd returns as scheduled.  He completed cycle 7 FOLFOX 03/29/2017.  Cycle 8 was held last week due to a persistent/worsening cough.  He was prescribed a one-week course of Levaquin.  He is seen today to reevaluate.  No significant change in the cough.  He has stable dyspnea on exertion.  No fever.  Over the past several days he has noted right-side chest pain with movement.  This is better today.  He wonders if he pulled a muscle.  Energy level is poor.  No nausea or vomiting.  No mouth sores.  No diarrhea.  Mild numbness in the left hand.  Objective:  Vital signs in last 24 hours:  Blood pressure 111/72, pulse (!) 104, temperature 97.7 F (36.5 C), temperature source Oral, resp. rate 18, height '5\' 8"'$  (1.727 m), weight 164 lb 4.8 oz (74.5 kg), SpO2 96 %.    HEENT: No thrush or ulcers. Resp: Rub throughout the left lung field.  No respiratory distress. Cardio: Regular, mild tachycardia. GI: Abdomen soft and nontender.  No hepatomegaly. Vascular: No leg edema.  Calves soft and nontender.  Skin: Palms without erythema. Port-A-Cath without erythema.   Lab Results:  Lab Results  Component Value Date   WBC 10.6 (H) 04/17/2017   HGB 10.1 (L) 03/01/2017   HCT 39.0 04/17/2017   MCV 93.1 04/17/2017   PLT 310 04/17/2017   NEUTROABS 8.3 (H) 04/17/2017    Imaging:  No results found.  Medications: I have reviewed the patient's current medications.  Assessment/Plan: 1. Metastatic esophagus cancer ? Distal esophagus mass at upper endoscopy 12/15/2016, biopsy confirmed invasive poorly differentiated adenocarcinoma ? CTs of 02/16/2016-distal esophagus thickening, mediastinal/right hilar lymphadenopathy, abdominal/retroperitoneal lymphadenopathy, liver metastases ? MRI lumbar spine 12/20/2016-7 mm S1 lesion suspicious for metastasis, bulky retroperitoneal and prevertebral malignant  lymphadenopathy, leftpsoasmuscle metastasis, degenerative disease causing severe neural foraminal stenosis at right L4 and left L5 ? PET scan 12/27/2016-hypermetabolic mass at the gastroesophageal junction; extensive hypermetabolic adenopathy involving the right supraclavicular nodal station, mediastinal lymph nodes, retrocrural lymph nodes, periaortic lymph nodes, left iliac lymph nodes and central mesenteric lymph nodes; fine nodularity in the right upper lobe with hypermetabolic thickening in the right suprahilar peribronchial structures; hypermetabolic metastasis to the right hepatic lobe; several small skeletal metastasis including right iliac bone, left sacrum and left scapula; multiple soft tissue metastases to the subcutaneous tissues adjacent to the left scapular musculature, retroperitoneal nodal metastasis along the left psoas muscle and metastatic lesion within the left gluteal musculature. No lesion in the lumbar spine or spinal canal to explain the patient's acute right-sided leg pain. ? Ultrasound-guided biopsy of a right liver lesion 01/04/2017-poorly differentiated adenocarcinoma ? PD-L1 expression30% ? Cycle 1 FOLFOX 01/05/2017 ? Cycle 2 FOLFOX 01/19/2017 ? Cycle 3 FOLFOX1/03/2017 ? Cycle 4 FOLFOX 02/15/2017 ? Cycle 5 FOLFOX 03/01/2017 ? CTs 03/13/2017 decreased size and number of lymph nodes in the chest, abdomen, and retroperitoneum. Decreased size of largest liver lesion, slight increase in size of small liver lesion, slight increase in left pleural effusion, development of inflammatory appearing airspace disease ? Cycle 6 FOLFOX 03/15/2017 ? Cycle 7 FOLFOX 03/29/2017 ? Cycle 8 FOLFOX 04/17/2017 (Oxaliplatin discontinued) 2. Painpossibly due to disc herniation-the pain may be related to retroperitoneal lymphadenopathy or bone metastases;plain x-ray right hip 12/21/2016 with no blastic or lytic bone lesions. No fracture or dislocation.Review of the lumbar spine MRI shows right L4  disc herniation  which could be responsible for the right leg pain and weakness. Trial of dexamethasone initiatedwith improvement.  3.Rheumatoid arthritis  4.Dysphagia/weight loss secondary to #1-improved 02/01/2017.  5.Chronic left pleural effusion  6.Port-A-Cath placement 01/04/2017  7.Early oxaliplatin neuropathy-diminished vibratory sense;mildly decreased to intact 03/29/2017.  8.Right lung inflammatory changes noted on chest CT 03/13/2017; increased cough 04/12/2017.  Chest x-ray 04/12/2017 with similar appearance left chronic pleural effusion.  Diffuse airspace disease right lung similar to prior chest CT.      Disposition: [facility] Todd appears stable.  He has completed 7 cycles of FOLFOX.  Treatment was held last week due to a persistent/worsening cough.  He completed a 7-day course of Levaquin.  The cough is essentially unchanged.  We discussed the potential for pneumonitis with Oxaliplatin.  Dr. Benay Spice recommends discontinuing Oxaliplatin from the treatment regimen.  He will receive the 5-FU pump today as scheduled.  We are referring him for a noncontrast CT scan of the chest.  He will return for a follow-up visit on 04/26/2017 to review that result and also to discuss initiation of "maintenance" Xeloda.  He will contact the office in the interim with any problems.  Patient seen with Dr. Benay Spice.  25 minutes were spent face-to-face at today's visit with the majority of that time involved in counseling/coordination of care.    Ned Card ANP/GNP-BC   04/17/2017  12:50 PM  This was a shared visit with Ned Card.  [facility] Todd has a persistent cough.  We are concerned he could have oxaliplatin related pneumonitis.  Oxaliplatin will be discontinued from the chemotherapy regimen.  He will complete a cycle of 5-fluorouracil today.  The plan is to initiate maintenance capecitabine within the next few weeks.  I reviewed the potential toxicities associated with capecitabine  and he agrees to proceed.  He will undergo a restaging CT of the chest prior to an office visit next week.  Julieanne Manson, MD

## 2017-04-17 NOTE — Telephone Encounter (Signed)
Appointments for chemo on 3/27 cancelled / keep only MD visit per 3/18 los

## 2017-04-19 ENCOUNTER — Inpatient Hospital Stay: Payer: No Typology Code available for payment source

## 2017-04-19 VITALS — BP 122/72 | HR 84 | Temp 98.2°F | Resp 18

## 2017-04-19 DIAGNOSIS — Z5111 Encounter for antineoplastic chemotherapy: Secondary | ICD-10-CM | POA: Diagnosis not present

## 2017-04-19 DIAGNOSIS — C155 Malignant neoplasm of lower third of esophagus: Secondary | ICD-10-CM

## 2017-04-19 MED ORDER — HEPARIN SOD (PORK) LOCK FLUSH 100 UNIT/ML IV SOLN
500.0000 [IU] | Freq: Once | INTRAVENOUS | Status: AC | PRN
  Administered 2017-04-19: 500 [IU]
  Filled 2017-04-19: qty 5

## 2017-04-19 MED ORDER — SODIUM CHLORIDE 0.9% FLUSH
10.0000 mL | INTRAVENOUS | Status: DC | PRN
  Administered 2017-04-19: 10 mL
  Filled 2017-04-19: qty 10

## 2017-04-20 ENCOUNTER — Encounter: Payer: Self-pay | Admitting: Nurse Practitioner

## 2017-04-20 ENCOUNTER — Encounter: Payer: Self-pay | Admitting: Oncology

## 2017-04-21 ENCOUNTER — Encounter: Payer: Self-pay | Admitting: Oncology

## 2017-04-21 ENCOUNTER — Telehealth: Payer: Self-pay | Admitting: Oncology

## 2017-04-21 ENCOUNTER — Telehealth: Payer: Self-pay | Admitting: Pharmacist

## 2017-04-21 ENCOUNTER — Ambulatory Visit (HOSPITAL_COMMUNITY)
Admission: RE | Admit: 2017-04-21 | Discharge: 2017-04-21 | Disposition: A | Discharge status: Home / Self Care | Payer: PRIVATE HEALTH INSURANCE | Source: Ambulatory Visit | Attending: Nurse Practitioner | Admitting: Nurse Practitioner

## 2017-04-21 ENCOUNTER — Telehealth: Payer: Self-pay | Admitting: Pharmacy Technician

## 2017-04-21 ENCOUNTER — Inpatient Hospital Stay (HOSPITAL_BASED_OUTPATIENT_CLINIC_OR_DEPARTMENT_OTHER): Payer: No Typology Code available for payment source | Admitting: Oncology

## 2017-04-21 VITALS — BP 123/82 | HR 105 | Temp 97.9°F | Resp 20 | Ht 68.0 in | Wt 164.6 lb

## 2017-04-21 DIAGNOSIS — R05 Cough: Secondary | ICD-10-CM

## 2017-04-21 DIAGNOSIS — C772 Secondary and unspecified malignant neoplasm of intra-abdominal lymph nodes: Secondary | ICD-10-CM | POA: Diagnosis not present

## 2017-04-21 DIAGNOSIS — C155 Malignant neoplasm of lower third of esophagus: Secondary | ICD-10-CM | POA: Insufficient documentation

## 2017-04-21 DIAGNOSIS — C778 Secondary and unspecified malignant neoplasm of lymph nodes of multiple regions: Secondary | ICD-10-CM

## 2017-04-21 DIAGNOSIS — R634 Abnormal weight loss: Secondary | ICD-10-CM | POA: Diagnosis not present

## 2017-04-21 DIAGNOSIS — J90 Pleural effusion, not elsewhere classified: Secondary | ICD-10-CM

## 2017-04-21 DIAGNOSIS — Z79899 Other long term (current) drug therapy: Secondary | ICD-10-CM | POA: Diagnosis not present

## 2017-04-21 DIAGNOSIS — I700 Atherosclerosis of aorta: Secondary | ICD-10-CM | POA: Insufficient documentation

## 2017-04-21 DIAGNOSIS — Z5111 Encounter for antineoplastic chemotherapy: Secondary | ICD-10-CM | POA: Diagnosis not present

## 2017-04-21 DIAGNOSIS — R0600 Dyspnea, unspecified: Secondary | ICD-10-CM

## 2017-04-21 DIAGNOSIS — C787 Secondary malignant neoplasm of liver and intrahepatic bile duct: Secondary | ICD-10-CM | POA: Diagnosis not present

## 2017-04-21 DIAGNOSIS — R079 Chest pain, unspecified: Secondary | ICD-10-CM

## 2017-04-21 DIAGNOSIS — M069 Rheumatoid arthritis, unspecified: Secondary | ICD-10-CM

## 2017-04-21 MED ORDER — AMOXICILLIN-POT CLAVULANATE 875-125 MG PO TABS
1.0000 | ORAL_TABLET | Freq: Two times a day (BID) | ORAL | 0 refills | Status: DC
Start: 2017-04-21 — End: 2017-05-02

## 2017-04-21 MED ORDER — CAPECITABINE 500 MG PO TABS: 1500.0000 mg | ORAL_TABLET | Freq: Two times a day (BID) | ORAL | 0 refills | Status: DC

## 2017-04-21 NOTE — Telephone Encounter (Signed)
Scheduled appt per 3/22 los- patient is aware of appt date and time.

## 2017-04-21 NOTE — Telephone Encounter (Signed)
Oral Oncology Pharmacist Encounter  Received new prescription for Xeloda (capecitabine) for the maintenance treatment of esophageal carcinoma, planned duration until disease progression or unacceptable toxicity.  Labs from 04/17/2017 assessed, okay for treatment.  Current medication list in Epic reviewed, no significant DDIs with Xeloda identified.  Test claim that the [facility] showed this pharmacy is not in network for patient's prescription insurance coverage. Per patient's insurance must be filled at [facility].  Xeloda prescription has been e- scribed.  Prior authorization with patient's insurance is required, is  submitted and is pending.  Oral Oncology Clinic will continue to follow for insurance authorization, copayment issues, initial counseling and start date.  Johny Drilling, PharmD, BCPS, BCOP 04/21/2017 2:16 PM Oral Oncology Clinic [phone]

## 2017-04-21 NOTE — Progress Notes (Addendum)
[facility] OFFICE PROGRESS NOTE   Diagnosis: Esophagus cancer  INTERVAL HISTORY:   [facility] returns prior to scheduled follow-up for evaluation of cough and dyspnea.  He completed a cycle of 5-fluorouracil beginning 04/17/2017.  He reports increased cough and shortness of breath since his visit earlier this week.  He continues to have pain at the right chest, worse with movement.  No fever.  Denies leg swelling.  Objective:  Vital signs in last 24 hours:  Blood pressure 123/82, pulse (!) 105, temperature 97.9 F (36.6 C), temperature source Oral, resp. rate 20, height '5\' 8"'$  (1.727 m), weight 164 lb 9.6 oz (74.7 kg), SpO2 97 %.    Lymphatics: No palpable cervical or supraclavicular lymph nodes. Resp: Rub throughout the left lung field.  Diminished breath sounds right lower lung field. Cardio: Regular with mild tachycardia, premature beats. GI: Abdomen soft and nontender.  No hepatomegaly. Vascular: No leg edema. Neuro: Alert and oriented. Port-A-Cath without erythema.  Lab Results:  Lab Results  Component Value Date   WBC 10.6 (H) 04/17/2017   HGB 10.1 (L) 03/01/2017   HCT 39.0 04/17/2017   MCV 93.1 04/17/2017   PLT 310 04/17/2017   NEUTROABS 8.3 (H) 04/17/2017    Imaging:  No results found.  Medications: I have reviewed the patient's current medications.  Assessment/Plan: 1. Metastatic esophagus cancer ? Distal esophagus mass at upper endoscopy 12/15/2016, biopsy confirmed invasive poorly differentiated adenocarcinoma ? CTs of 02/16/2016-distal esophagus thickening, mediastinal/right hilar lymphadenopathy, abdominal/retroperitoneal lymphadenopathy, liver metastases ? MRI lumbar spine 12/20/2016-7 mm S1 lesion suspicious for metastasis, bulky retroperitoneal and prevertebral malignant lymphadenopathy, leftpsoasmuscle metastasis, degenerative disease causing severe neural foraminal stenosis at right L4 and left L5 ? PET scan 12/27/2016-hypermetabolic  mass at the gastroesophageal junction; extensive hypermetabolic adenopathy involving the right supraclavicular nodal station, mediastinal lymph nodes, retrocrural lymph nodes, periaortic lymph nodes, left iliac lymph nodes and central mesenteric lymph nodes; fine nodularity in the right upper lobe with hypermetabolic thickening in the right suprahilar peribronchial structures; hypermetabolic metastasis to the right hepatic lobe; several small skeletal metastasis including right iliac bone, left sacrum and left scapula; multiple soft tissue metastases to the subcutaneous tissues adjacent to the left scapular musculature, retroperitoneal nodal metastasis along the left psoas muscle and metastatic lesion within the left gluteal musculature. No lesion in the lumbar spine or spinal canal to explain the patient's acute right-sided leg pain. ? Ultrasound-guided biopsy of a right liver lesion 01/04/2017-poorly differentiated adenocarcinoma ? PD-L1 expression30% ? Cycle 1 FOLFOX 01/05/2017 ? Cycle 2 FOLFOX 01/19/2017 ? Cycle 3 FOLFOX1/03/2017 ? Cycle 4 FOLFOX 02/15/2017 ? Cycle 5 FOLFOX 03/01/2017 ? CTs 03/13/2017 decreased size and number of lymph nodes in the chest, abdomen, and retroperitoneum. Decreased size of largest liver lesion, slight increase in size of small liver lesion, slight increase in left pleural effusion, development of inflammatory appearing airspace disease ? Cycle 6 FOLFOX 03/15/2017 ? Cycle 7 FOLFOX 03/29/2017 ? Cycle 8 FOLFOX 04/17/2017 (Oxaliplatin discontinued) 2. Painpossibly due to disc herniation-the pain may be related to retroperitoneal lymphadenopathy or bone metastases;plain x-ray right hip 12/21/2016 with no blastic or lytic bone lesions. No fracture or dislocation.Review of the lumbar spine MRI shows right L4 disc herniation which could be responsible for the right leg pain and weakness. Trial of dexamethasone initiatedwith improvement.  3.Rheumatoid  arthritis  4.Dysphagia/weight loss secondary to #1-improved 02/01/2017.  5.Chronic left pleural effusion  6.Port-A-Cath placement 01/04/2017  7.Early oxaliplatin neuropathy-diminished vibratory sense;mildly decreased to intact 03/29/2017.  8.Right lung  inflammatory changes noted on chest CT 03/13/2017;increased cough 04/12/2017. Chest x-ray 04/12/2017 with similar appearance left chronic pleural effusion. Diffuse airspace disease right lung similar to prior chest CT.     Disposition: Grant Todd presents with worsening dyspnea and cough.  Per our review of the chest CT images he appears to have a moderate right pleural effusion and progressive changes of the right upper lung with increased consolidation.  We are referring him for a therapeutic/diagnostic thoracentesis.  He has oxygen at home.  He will begin hydrocodone as needed for the right chest pain.  We sent a prescription to his pharmacy for Augmentin 875 mg twice daily for 10 days.  He and his wife understand the CT changes most likely indicate progression of the cancer.  Dr. Benay Spice recommends initiation of Pembrolizumab.  We reviewed potential toxicities including an allergic reaction, rash, diarrhea, endocrinopathies, hepatitis.  He agrees to proceed.  He will return for follow-up and cycle 1 Pembrolizumab 04/26/2017.  He will contact the office in the interim with further problems.  Patient seen with Dr. Benay Spice.  CT images reviewed on the computer with Grant Todd and his wife.  40 minutes were spent face-to-face at today's visit with the majority of that time involved in counseling/coordination of care.    Ned Card ANP/GNP-BC   04/21/2017  3:54 PM  This was shared visit with Ned Card.  Grant Todd was interviewed and examined.  There is clinical and CT evidence for disease progression.  We reviewed the CT images from today with Grant Todd and his wife.  The final report is pending.  There is a progressive right  pleural effusion, pleural/chest wall disease at the right mid chest, increased consolidation in the right upper lung, and persistent mediastinal lymphadenopathy.  There also appears to be increased distal esophageal thickening.  We discussed treatment options.  The PD1 expression was measured at 30% in his case.  There is recent data supporting the use of immunotherapy in this setting.  We reviewed the potential toxicities associated with pembrolizumab.  He agrees to proceed.  He will be scheduled for diagnostic/therapeutic thoracentesis within the next few days.  He will begin a course of empiric antibiotics in case the right lung consolidation is related to pneumonia.  He will return for an office visit and cycle 1 pembrolizumab on 04/26/2017.  Julieanne Manson, MD

## 2017-04-21 NOTE — Progress Notes (Signed)
DISCONTINUE ON PATHWAY REGIMEN - Gastroesophageal     A cycle is every 14 days:     Oxaliplatin      Leucovorin      5-Fluorouracil      5-Fluorouracil   **Always confirm dose/schedule in your pharmacy ordering system**    REASON: Disease Progression PRIOR TREATMENT: GEOS3: mFOLFOX6 q14 Days Until Progression or Unacceptable Toxicity TREATMENT RESPONSE: Partial Response (PR)  START OFF PATHWAY REGIMEN - Gastroesophageal   [id number]:Pembrolizumab 200 mg q21 Days:   A cycle is 21 days:     Pembrolizumab   **Always confirm dose/schedule in your pharmacy ordering system**    Patient Characteristics: Distant Metastases (cM1/pM1) / Locally Recurrent Disease, Adenocarcinoma - Esophageal, GE Junction, and Gastric, Second Line, MSS / pMMR or MSI Unknown Histology: Adenocarcinoma Disease Classification: Esophageal Therapeutic Status: Distant Metastases (No Additional Staging) Would you be surprised if this patient died  in the next year<= I would NOT be surprised if this patient died in the next year Line of Therapy: Second Line Microsatellite/Mismatch Repair Status: Unknown Intent of Therapy: Non-Curative / Palliative Intent, Discussed with Patient

## 2017-04-21 NOTE — Telephone Encounter (Signed)
Called pt in response to mychart message reporting increased dyspnea. Pt was just arriving to radiology for scan. He felt and sounded short of breath during call. Reviewed with Ned Card, NP: Work in with Dr. Benay Spice after scan. Called pt with appt.

## 2017-04-21 NOTE — Telephone Encounter (Signed)
Oral Chemotherapy Pharmacist Encounter   I spoke with patient and wife in exam room for overview of: Xeloda.   Patient transitioning from therapy with FOLFOX to Xeloda monotherapy. Last 5-FU infusion on 04/17/2017  Counseled patient on administration, dosing, side effects, monitoring, drug-food interactions, safe handling, storage, and disposal.  Patient will take Xeloda 500mg  tablets, 3 tablets (1500 mg) by mouth in AM and 3 tabs (1500 mg) by mouth in PM, within 30 minutes of finishing meals, on days 1- 7 and 15-21 of each 28 day cycle.   Xeloda planned start date: 04/26/2017  Side effects of Xeloda include but not limited to: fatigue, decreased blood counts, GI upset, diarrhea, and hand-foot syndrome. Patient has loperamide at home and will call the office if diarrhea develops.    Reviewed with patient importance of keeping a medication schedule and plan for any missed doses.  Mr. and Mrs. Sanseverino voiced understanding and appreciation.   All questions answered. Medication reconciliation performed and medication/allergy list updated.  Patient stated he had stopped therapy for rheumatoid arthritis when starting on chemotherapy.  Patient questions whether or not he will be able to go back on his rheumatoid arthritis medications now that he is no longer taking oxaliplatin.  Patient had discontinued these medications due to immunosuppressive qualities of both his rheumatology medications and his chemotherapy. Xeloda screened for interactions with either Enbrel or Arava.  No significant interactions identified.  This was discussed with patient and wife.  Patient updated about [facility] as dispensing pharmacy and provided phone number [phone]). Patient with pre-existing rheumatoid arthritis and had been using this pharmacy for his other specialty medications.  Patient knows to call the office with questions or concerns. Oral Oncology Clinic will continue to follow.  Thank you,  Johny Drilling, PharmD, BCPS, BCOP 04/21/2017   4:07 PM Oral Oncology Clinic [phone]

## 2017-04-21 NOTE — Telephone Encounter (Signed)
Oral Oncology Patient Advocate Encounter  Received notification from [employer] that prior authorization for Xeloda is required.  PA submitted on CoverMyMeds Key [id number] Status is pending  Oral Oncology Clinic will continue to follow.  Fabio Asa. Melynda Keller, [place] Patient [place] [phone] 04/21/2017 1:11 PM]

## 2017-04-22 ENCOUNTER — Other Ambulatory Visit: Payer: Self-pay | Admitting: Oncology

## 2017-04-24 ENCOUNTER — Telehealth: Payer: Self-pay

## 2017-04-24 ENCOUNTER — Ambulatory Visit (HOSPITAL_COMMUNITY)
Admission: RE | Admit: 2017-04-24 | Discharge: 2017-04-24 | Disposition: A | Discharge status: Home / Self Care | Payer: PRIVATE HEALTH INSURANCE | Source: Ambulatory Visit | Attending: Radiology | Admitting: Radiology

## 2017-04-24 ENCOUNTER — Ambulatory Visit (HOSPITAL_COMMUNITY)
Admission: RE | Admit: 2017-04-24 | Discharge: 2017-04-24 | Disposition: A | Discharge status: Home / Self Care | Payer: PRIVATE HEALTH INSURANCE | Source: Ambulatory Visit | Attending: Nurse Practitioner | Admitting: Nurse Practitioner

## 2017-04-24 DIAGNOSIS — J90 Pleural effusion, not elsewhere classified: Secondary | ICD-10-CM | POA: Diagnosis not present

## 2017-04-24 DIAGNOSIS — C155 Malignant neoplasm of lower third of esophagus: Secondary | ICD-10-CM | POA: Insufficient documentation

## 2017-04-24 DIAGNOSIS — Z9889 Other specified postprocedural states: Secondary | ICD-10-CM

## 2017-04-24 MED ORDER — LIDOCAINE HCL 1 % IJ SOLN
INTRAMUSCULAR | Status: AC
  Filled 2017-04-24: qty 10

## 2017-04-24 NOTE — Procedures (Signed)
Ultrasound-guided diagnostic and therapeutic right thoracentesis performed yielding 2 liters of bloody fluid. No immediate complications. Follow-up chest x-ray pending. A portion of the fluid was sent to the lab for cytology. Dr. Benay Spice notified.

## 2017-04-24 NOTE — Telephone Encounter (Signed)
Informed pt of appt with [facility] for thoracentesis today at 1pm. Pt voiced understanding.

## 2017-04-24 NOTE — Telephone Encounter (Signed)
Oral Oncology Patient Advocate Encounter  Prior Authorization for Capecitabine has been approved.    PA# [id number] Effective dates: 04/21/2017 through 04/22/2018  Oral Oncology Clinic will continue to follow.   Fabio Asa. Melynda Keller, [place] Patient [place] [phone] 04/24/2017 10:57 AM

## 2017-04-26 ENCOUNTER — Other Ambulatory Visit: Payer: PRIVATE HEALTH INSURANCE

## 2017-04-26 ENCOUNTER — Ambulatory Visit: Payer: PRIVATE HEALTH INSURANCE

## 2017-04-26 ENCOUNTER — Inpatient Hospital Stay (HOSPITAL_BASED_OUTPATIENT_CLINIC_OR_DEPARTMENT_OTHER): Payer: No Typology Code available for payment source | Admitting: Oncology

## 2017-04-26 ENCOUNTER — Inpatient Hospital Stay: Payer: No Typology Code available for payment source

## 2017-04-26 ENCOUNTER — Telehealth: Payer: Self-pay | Admitting: Oncology

## 2017-04-26 VITALS — BP 119/72 | HR 94 | Temp 98.7°F | Resp 18 | Ht 68.0 in | Wt 165.6 lb

## 2017-04-26 DIAGNOSIS — C155 Malignant neoplasm of lower third of esophagus: Secondary | ICD-10-CM

## 2017-04-26 DIAGNOSIS — Z79899 Other long term (current) drug therapy: Secondary | ICD-10-CM

## 2017-04-26 DIAGNOSIS — R079 Chest pain, unspecified: Secondary | ICD-10-CM

## 2017-04-26 DIAGNOSIS — Z9981 Dependence on supplemental oxygen: Secondary | ICD-10-CM

## 2017-04-26 DIAGNOSIS — J910 Malignant pleural effusion: Secondary | ICD-10-CM

## 2017-04-26 DIAGNOSIS — Z5111 Encounter for antineoplastic chemotherapy: Secondary | ICD-10-CM | POA: Diagnosis not present

## 2017-04-26 DIAGNOSIS — M069 Rheumatoid arthritis, unspecified: Secondary | ICD-10-CM

## 2017-04-26 DIAGNOSIS — R0600 Dyspnea, unspecified: Secondary | ICD-10-CM | POA: Diagnosis not present

## 2017-04-26 DIAGNOSIS — C778 Secondary and unspecified malignant neoplasm of lymph nodes of multiple regions: Secondary | ICD-10-CM | POA: Diagnosis not present

## 2017-04-26 DIAGNOSIS — C787 Secondary malignant neoplasm of liver and intrahepatic bile duct: Secondary | ICD-10-CM | POA: Diagnosis not present

## 2017-04-26 LAB — CMP (CANCER CENTER ONLY)
ALBUMIN: 2.1 g/dL — AB (ref 3.5–5.0)
ALT: 43 U/L (ref 0–55)
ANION GAP: 13 — AB (ref 3–11)
AST: 43 U/L — AB (ref 5–34)
Alkaline Phosphatase: 447 U/L — ABNORMAL HIGH (ref 40–150)
BILIRUBIN TOTAL: 0.5 mg/dL (ref 0.2–1.2)
BUN: 23 mg/dL (ref 7–26)
CHLORIDE: 107 mmol/L (ref 98–109)
CO2: 22 mmol/L (ref 22–29)
Calcium: 10.1 mg/dL (ref 8.4–10.4)
Creatinine: 0.81 mg/dL (ref 0.70–1.30)
GFR, Est AFR Am: >60 mL/min (ref >60)
GFR, Estimated: >60 mL/min (ref >60)
GLUCOSE: 133 mg/dL (ref 70–140)
POTASSIUM: 4.1 mmol/L (ref 3.5–5.1)
SODIUM: 142 mmol/L (ref 136–145)
TOTAL PROTEIN: 6.9 g/dL (ref 6.4–8.3)

## 2017-04-26 LAB — CBC WITH DIFFERENTIAL (CANCER CENTER ONLY)
BASOS ABS: 0 10*3/uL (ref 0.0–0.1)
BASOS PCT: 0 %
EOS ABS: 0.3 10*3/uL (ref 0.0–0.5)
Eosinophils Relative: 1 %
HEMATOCRIT: 39.3 % (ref 38.4–49.9)
Hemoglobin: 12.3 g/dL — ABNORMAL LOW (ref 13.0–17.1)
Lymphocytes Relative: 10 %
Lymphs Abs: 1.8 10*3/uL (ref 0.9–3.3)
MCH: 29 pg (ref 27.2–33.4)
MCHC: 31.3 g/dL — ABNORMAL LOW (ref 32.0–36.0)
MCV: 92.7 fL (ref 79.3–98.0)
MONO ABS: 1.8 10*3/uL — AB (ref 0.1–0.9)
Monocytes Relative: 10 %
NEUTROS ABS: 14.3 10*3/uL — AB (ref 1.5–6.5)
NEUTROS PCT: 79 %
Platelet Count: 286 10*3/uL (ref 140–400)
RBC: 4.24 MIL/uL (ref 4.20–5.82)
RDW: 19.3 % — AB (ref 11.0–14.6)
WBC Count: 18.1 10*3/uL — ABNORMAL HIGH (ref 4.0–10.3)

## 2017-04-26 MED ORDER — SODIUM CHLORIDE 0.9 % IV SOLN
Freq: Once | INTRAVENOUS | Status: AC
  Administered 2017-04-26: 10:00:00 via INTRAVENOUS

## 2017-04-26 MED ORDER — SODIUM CHLORIDE 0.9 % IV SOLN
200.0000 mg | Freq: Once | INTRAVENOUS | Status: AC
  Administered 2017-04-26: 200 mg via INTRAVENOUS
  Filled 2017-04-26: qty 8

## 2017-04-26 MED ORDER — SODIUM CHLORIDE 0.9% FLUSH
10.0000 mL | INTRAVENOUS | Status: DC | PRN
  Administered 2017-04-26: 10 mL
  Filled 2017-04-26: qty 10

## 2017-04-26 MED ORDER — MORPHINE SULFATE ER 15 MG PO TBCR
15.0000 mg | EXTENDED_RELEASE_TABLET | Freq: Two times a day (BID) | ORAL | 0 refills | Status: AC
Start: 2017-04-26 — End: ?

## 2017-04-26 MED ORDER — HEPARIN SOD (PORK) LOCK FLUSH 100 UNIT/ML IV SOLN
500.0000 [IU] | Freq: Once | INTRAVENOUS | Status: AC | PRN
  Administered 2017-04-26: 500 [IU]
  Filled 2017-04-26: qty 5

## 2017-04-26 NOTE — Progress Notes (Signed)
[place] OFFICE PROGRESS NOTE   Diagnosis: Esophagus cancer  INTERVAL HISTORY:   Mr. Harnois returns as scheduled.  He underwent a right thoracentesis 04/24/2017 for 2 L of bloody fluid.  The cytology returned positive for metastatic adenocarcinoma. He continues to have exertional dyspnea.  He is using oxygen at home.  He complains of pain at the right chest.  The pain is not relieved with hydrocodone.  Objective:  Vital signs in last 24 hours:  Blood pressure 119/72, pulse 94, temperature 98.7 F (37.1 C), temperature source Oral, resp. rate 18, height _0  (1.727 m), weight 165 lb 9.6 oz (75.1 kg), SpO2 (!) 89 %.    Resp: Decreased breath sounds at the right lower chest, no respiratory distress Cardio: Regular rate and rhythm GI: No hepatomegaly, nontender Vascular: No leg edema Muscular skeletal: Tenderness of the right posterior lateral chest wall, no mass.  Portacath/PICC-without erythema  Lab Results:  Lab Results  Component Value Date   WBC 18.1 (H) 04/26/2017   HGB 10.1 (L) 03/01/2017   HCT 39.3 04/26/2017   MCV 92.7 04/26/2017   PLT 286 04/26/2017   NEUTROABS 14.3 (H) 04/26/2017    CMP     Component Value Date/Time   NA 142 04/26/2017 0757   NA 139 02/01/2017 0853   K 4.1 04/26/2017 0757   K 3.6 02/01/2017 0853   CL 107 04/26/2017 0757   CO2 22 04/26/2017 0757   CO2 25 02/01/2017 0853   GLUCOSE 133 04/26/2017 0757   GLUCOSE 102 02/01/2017 0853   BUN 23 04/26/2017 0757   BUN 18.3 02/01/2017 0853   CREATININE 0.81 04/26/2017 0757   CREATININE 0.7 02/01/2017 0853   CALCIUM 10.1 04/26/2017 0757   CALCIUM 8.8 02/01/2017 0853   PROT 6.9 04/26/2017 0757   PROT 6.0 (L) 02/01/2017 0853   ALBUMIN 2.1 (L) 04/26/2017 0757   ALBUMIN 2.3 (L) 02/01/2017 0853   AST 43 (H) 04/26/2017 0757   AST 31 02/01/2017 0853   ALT 43 04/26/2017 0757   ALT 41 02/01/2017 0853   ALKPHOS 447 (H) 04/26/2017 0757   ALKPHOS 308 (H) 02/01/2017 0853   BILITOT  0.5 04/26/2017 0757   BILITOT 0.23 02/01/2017 0853   GFRNONAA >60 04/26/2017 0757   GFRAA >60 04/26/2017 0757    Lab Results  Component Value Date   CEA1 1,045.42 (H) 04/12/2017    Lab Results  Component Value Date   INR 1.03 01/04/2017    Imaging:  Dg Chest 1 View  Result Date: 04/24/2017 CLINICAL DATA:  Status post thoracentesis, history of esophageal cancer EXAM: CHEST  1 VIEW COMPARISON:  CT chest 04/21/2017, radiograph 04/12/2017 FINDINGS: Small bilateral pleural effusions. Right-sided central venous port tip overlies SVC. No pneumothorax identified. Masslike consolidation in the right upper lobe. Patchy infiltrate in the right peripheral lower lung. Stable cardiomediastinal silhouette. No pneumothorax. IMPRESSION: 1. Small right pleural effusion. Similar appearance of left pleural effusion or thickening. No pneumothorax seen. 2. Slight increased interstitial opacity in the right thorax. Similar appearance of masslike opacity in the right upper lobe with increased patchy infiltrate in the peripheral right lower lung. Electronically Signed   By: Donavan Foil M.D.   On: 04/24/2017 14:24   US Thoracentesis Asp Pleural Space W/img Guide  Result Date: 04/24/2017 INDICATION: Metastatic esophageal cancer, dyspnea, right pleural effusion. Request made for diagnostic and therapeutic right thoracentesis. EXAM: ULTRASOUND GUIDED DIAGNOSTIC AND THERAPEUTIC RIGHT THORACENTESIS MEDICATIONS: None COMPLICATIONS: None immediate. PROCEDURE: An ultrasound guided thoracentesis was  thoroughly discussed with the patient and questions answered. The benefits, risks, alternatives and complications were also discussed. The patient understands and wishes to proceed with the procedure. Written consent was obtained. Ultrasound was performed to localize and mark an adequate pocket of fluid in the right chest. The area was then prepped and draped in the normal sterile fashion. 1% Lidocaine was used for local  anesthesia. Under ultrasound guidance a 6 Fr Safe-T-Centesis catheter was introduced. Thoracentesis was performed. The catheter was removed and a dressing applied. FINDINGS: A total of approximately 2 liters of bloody fluid was removed. Samples were sent to the laboratory as requested by the clinical team. IMPRESSION: Successful ultrasound guided diagnostic and therapeutic right thoracentesis yielding 2 liters of pleural fluid. Dr. Benay Spice was notified of the above findings. Follow-up chest x-ray revealed no pneumothorax. Read by: Rowe Robert, PA-C Electronically Signed   By: Marybelle Killings M.D.   On: 04/24/2017 14:22    Medications: I have reviewed the patient's current medications.   Assessment/Plan: 1. Metastatic esophagus cancer ? Distal esophagus mass at upper endoscopy 12/15/2016, biopsy confirmed invasive poorly differentiated adenocarcinoma ? CTs of 02/16/2016-distal esophagus thickening, mediastinal/right hilar lymphadenopathy, abdominal/retroperitoneal lymphadenopathy, liver metastases ? MRI lumbar spine 12/20/2016-7 mm S1 lesion suspicious for metastasis, bulky retroperitoneal and prevertebral malignant lymphadenopathy, leftpsoasmuscle metastasis, degenerative disease causing severe neural foraminal stenosis at right L4 and left L5 ? PET scan 12/27/2016-hypermetabolic mass at the gastroesophageal junction; extensive hypermetabolic adenopathy involving the right supraclavicular nodal station, mediastinal lymph nodes, retrocrural lymph nodes, periaortic lymph nodes, left iliac lymph nodes and central mesenteric lymph nodes; fine nodularity in the right upper lobe with hypermetabolic thickening in the right suprahilar peribronchial structures; hypermetabolic metastasis to the right hepatic lobe; several small skeletal metastasis including right iliac bone, left sacrum and left scapula; multiple soft tissue metastases to the subcutaneous tissues adjacent to the left scapular musculature,  retroperitoneal nodal metastasis along the left psoas muscle and metastatic lesion within the left gluteal musculature. No lesion in the lumbar spine or spinal canal to explain the patient's acute right-sided leg pain. ? Ultrasound-guided biopsy of a right liver lesion 01/04/2017-poorly differentiated adenocarcinoma ? PD-L1 expression30% ? Cycle 1 FOLFOX 01/05/2017 ? Cycle 2 FOLFOX 01/19/2017 ? Cycle 3 FOLFOX1/03/2017 ? Cycle 4 FOLFOX 02/15/2017 ? Cycle 5 FOLFOX 03/01/2017 ? CTs 03/13/2017 decreased size and number of lymph nodes in the chest, abdomen, and retroperitoneum. Decreased size of largest liver lesion, slight increase in size of small liver lesion, slight increase in left pleural effusion, development of inflammatory appearing airspace disease ? Cycle 6 FOLFOX 03/15/2017 ? Cycle 7 FOLFOX 03/29/2017 ? Cycle 8 FOLFOX 04/17/2017 (Oxaliplatin discontinued) ? CT chest 04/21/2017- progression of thoracic and upper abdominal nodal metastases, masslike opacity in the right upper lobe-progressive with associated lymphangitic tumor spread in the right lung, new right pleural effusion, unchanged left pleural effusion ? Thoracentesis 04/24/2017- metastatic adenocarcinoma ? Cycle 1 pembrolizumab 04/26/2017 2. Painpossibly due to disc herniation-the pain may be related to retroperitoneal lymphadenopathy or bone metastases;plain x-ray right hip 12/21/2016 with no blastic or lytic bone lesions. No fracture or dislocation.Review of the lumbar spine MRI shows right L4 disc herniation which could be responsible for the right leg pain and weakness. Trial of dexamethasone initiatedwith improvement.  3.Rheumatoid arthritis  4.Dysphagia/weight loss secondary to #1-improved 02/01/2017.  5.Chronic left pleural effusion  6.Port-A-Cath placement 01/04/2017  7.Early oxaliplatin neuropathy-diminished vibratory sense;mildly decreased to intact 03/29/2017.  8.Right lung inflammatory changes  noted on chest CT 03/13/2017;increased cough 04/12/2017. Chest  x-ray 04/12/2017 with similar appearance left chronic pleural effusion. Diffuse airspace disease right lung similar to prior chest CT.   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  There is clinical and CT evidence of disease progression.  We discussed treatment options again today.  We discussed Taxol/ramucirumab and pembrolizumab therapies.  The plan is to proceed with pembrolizumab therapy beginning today.  We reviewed the potential toxicities associated with pembrolizumab including the chance of an allergic reaction, rash, diarrhea, hepatitis, worsening of rheumatoid arthritis, and endocrinopathies.  He agrees to proceed.  He has pain, most likely related to the malignant right pleural effusion and tumor in the right upper lobe.  He will begin MS Contin and continue hydrocodone/oxycodone for breakthrough pain.  He will return for an office visit and repeat chest x-ray in approximately 1 week.  25 minutes were spent with the patient today.  The majority of the time was used for counseling and coordination of care.  Betsy Coder, MD  04/26/2017  3:23 PM

## 2017-04-26 NOTE — Patient Instructions (Signed)
[facility] Discharge Instructions for Patients Receiving Chemotherapy  Today you received the following chemotherapy agents Keytruda  To help prevent nausea and vomiting after your treatment, we encourage you to take your nausea medication as prescribed    If you develop nausea and vomiting that is not controlled by your nausea medication, call the clinic.   BELOW ARE SYMPTOMS THAT SHOULD BE REPORTED IMMEDIATELY:  *FEVER GREATER THAN 100.5 F  *CHILLS WITH OR WITHOUT FEVER  NAUSEA AND VOMITING THAT IS NOT CONTROLLED WITH YOUR NAUSEA MEDICATION  *UNUSUAL SHORTNESS OF BREATH  *UNUSUAL BRUISING OR BLEEDING  TENDERNESS IN MOUTH AND THROAT WITH OR WITHOUT PRESENCE OF ULCERS  *URINARY PROBLEMS  *BOWEL PROBLEMS  UNUSUAL RASH Items with * indicate a potential emergency and should be followed up as soon as possible.  Feel free to call the clinic should you have any questions or concerns. The clinic phone number is [phone].  Please show the CHEMO ALERT CARD at check-in to the Emergency Department and triage nurse.  Pembrolizumab injection (Keytruda) What is this medicine? PEMBROLIZUMAB (pem broe liz ue mab) is a monoclonal antibody. It is used to treat melanoma, head and neck cancer, Hodgkin lymphoma, non-small cell lung cancer, urothelial cancer, stomach cancer, and cancers that have a certain genetic condition. This medicine may be used for other purposes; ask your health care provider or pharmacist if you have questions. COMMON BRAND NAME(S): Keytruda What should I tell my health care provider before I take this medicine? They need to know if you have any of these conditions: -diabetes -immune system problems -inflammatory bowel disease -liver disease -lung or breathing disease -lupus -organ transplant -an unusual or allergic reaction to pembrolizumab, other medicines, foods, dyes, or preservatives -pregnant or trying to get  pregnant -breast-feeding How should I use this medicine? This medicine is for infusion into a vein. It is given by a health care professional in a hospital or clinic setting. A special MedGuide will be given to you before each treatment. Be sure to read this information carefully each time. Talk to your pediatrician regarding the use of this medicine in children. While this drug may be prescribed for selected conditions, precautions do apply. Overdosage: If you think you have taken too much of this medicine contact a poison control center or emergency room at once. NOTE: This medicine is only for you. Do not share this medicine with others. What if I miss a dose? It is important not to miss your dose. Call your doctor or health care professional if you are unable to keep an appointment. What may interact with this medicine? Interactions have not been studied. Give your health care provider a list of all the medicines, herbs, non-prescription drugs, or dietary supplements you use. Also tell them if you smoke, drink alcohol, or use illegal drugs. Some items may interact with your medicine. This list may not describe all possible interactions. Give your health care provider a list of all the medicines, herbs, non-prescription drugs, or dietary supplements you use. Also tell them if you smoke, drink alcohol, or use illegal drugs. Some items may interact with your medicine. What should I watch for while using this medicine? Your condition will be monitored carefully while you are receiving this medicine. You may need blood work done while you are taking this medicine. Do not become pregnant while taking this medicine or for 4 months after stopping it. Women should inform their doctor if they wish to become pregnant or think   they might be pregnant. There is a potential for serious side effects to an unborn child. Talk to your health care professional or pharmacist for more information. Do not breast-feed  an infant while taking this medicine or for 4 months after the last dose. What side effects may I notice from receiving this medicine? Side effects that you should report to your doctor or health care professional as soon as possible: -allergic reactions like skin rash, itching or hives, swelling of the face, lips, or tongue -bloody or black, tarry -breathing problems -changes in vision -chest pain -chills -constipation -cough -dizziness or feeling faint or lightheaded -fast or irregular heartbeat -fever -flushing -hair loss -low blood counts - this medicine may decrease the number of white blood cells, red blood cells and platelets. You may be at increased risk for infections and bleeding. -muscle pain -muscle weakness -persistent headache -signs and symptoms of high blood sugar such as dizziness; dry mouth; dry skin; fruity breath; nausea; stomach pain; increased hunger or thirst; increased urination -signs and symptoms of kidney injury like trouble passing urine or change in the amount of urine -signs and symptoms of liver injury like dark urine, light-colored stools, loss of appetite, nausea, right upper belly pain, yellowing of the eyes or skin -stomach pain -sweating -weight loss Side effects that usually do not require medical attention (report to your doctor or health care professional if they continue or are bothersome): -decreased appetite -diarrhea -tiredness This list may not describe all possible side effects. Call your doctor for medical advice about side effects. You may report side effects to FDA at 1-800-FDA-1088. Where should I keep my medicine? This drug is given in a hospital or clinic and will not be stored at home. NOTE: This sheet is a summary. It may not cover all possible information. If you have questions about this medicine, talk to your doctor, pharmacist, or health care provider.  2018 Elsevier/Gold Standard (2015-10-27 12:29:36)  

## 2017-04-26 NOTE — Telephone Encounter (Signed)
Scheduled appt per 3/27 los- Gave patient AVS and calender per los.  

## 2017-04-27 ENCOUNTER — Telehealth: Payer: Self-pay

## 2017-04-27 NOTE — Telephone Encounter (Signed)
Call from pt wife stating that pt wishes to have home O2 order changed from intermittent to continuous. Pt currently on 2L. Pt wife states "at the office when he walked from the exam room to infusion, his o2 dropped to the 80's, so we want tyo put him on something continuous". Per Dr. Benay Spice, ok to switch to 2L continuous oxygen. Written script faxed to Oran Rein with [facility].

## 2017-04-28 ENCOUNTER — Encounter: Payer: Self-pay | Admitting: Oncology

## 2017-04-28 ENCOUNTER — Telehealth: Payer: Self-pay

## 2017-04-28 ENCOUNTER — Encounter (HOSPITAL_COMMUNITY): Payer: Self-pay

## 2017-04-28 ENCOUNTER — Inpatient Hospital Stay (HOSPITAL_BASED_OUTPATIENT_CLINIC_OR_DEPARTMENT_OTHER): Payer: No Typology Code available for payment source | Admitting: Oncology

## 2017-04-28 ENCOUNTER — Emergency Department (HOSPITAL_COMMUNITY): Payer: No Typology Code available for payment source

## 2017-04-28 ENCOUNTER — Other Ambulatory Visit: Payer: Self-pay

## 2017-04-28 ENCOUNTER — Inpatient Hospital Stay (HOSPITAL_COMMUNITY)
Admission: EM | Admit: 2017-04-28 | Discharge: 2017-05-05 | DRG: 871 | Disposition: A | Discharge status: Home / Self Care | Payer: No Typology Code available for payment source | Attending: Family Medicine | Admitting: Family Medicine

## 2017-04-28 ENCOUNTER — Other Ambulatory Visit: Payer: Self-pay | Admitting: Nurse Practitioner

## 2017-04-28 ENCOUNTER — Telehealth: Payer: Self-pay | Admitting: *Deleted

## 2017-04-28 VITALS — BP 100/63 | HR 69 | Temp 98.3°F | Resp 19 | Ht 68.0 in | Wt 163.4 lb

## 2017-04-28 DIAGNOSIS — T451X5A Adverse effect of antineoplastic and immunosuppressive drugs, initial encounter: Secondary | ICD-10-CM | POA: Diagnosis present

## 2017-04-28 DIAGNOSIS — Z79899 Other long term (current) drug therapy: Secondary | ICD-10-CM | POA: Diagnosis not present

## 2017-04-28 DIAGNOSIS — R05 Cough: Secondary | ICD-10-CM

## 2017-04-28 DIAGNOSIS — Y95 Nosocomial condition: Secondary | ICD-10-CM | POA: Diagnosis present

## 2017-04-28 DIAGNOSIS — J441 Chronic obstructive pulmonary disease with (acute) exacerbation: Secondary | ICD-10-CM | POA: Diagnosis present

## 2017-04-28 DIAGNOSIS — Z9049 Acquired absence of other specified parts of digestive tract: Secondary | ICD-10-CM

## 2017-04-28 DIAGNOSIS — R1310 Dysphagia, unspecified: Secondary | ICD-10-CM | POA: Diagnosis present

## 2017-04-28 DIAGNOSIS — R188 Other ascites: Secondary | ICD-10-CM | POA: Diagnosis present

## 2017-04-28 DIAGNOSIS — M069 Rheumatoid arthritis, unspecified: Secondary | ICD-10-CM | POA: Diagnosis not present

## 2017-04-28 DIAGNOSIS — J440 Chronic obstructive pulmonary disease with acute lower respiratory infection: Secondary | ICD-10-CM | POA: Diagnosis present

## 2017-04-28 DIAGNOSIS — R40241 Glasgow coma scale score 13-15, unspecified time: Secondary | ICD-10-CM | POA: Diagnosis present

## 2017-04-28 DIAGNOSIS — K761 Chronic passive congestion of liver: Secondary | ICD-10-CM | POA: Diagnosis present

## 2017-04-28 DIAGNOSIS — Z9221 Personal history of antineoplastic chemotherapy: Secondary | ICD-10-CM

## 2017-04-28 DIAGNOSIS — J910 Malignant pleural effusion: Secondary | ICD-10-CM | POA: Diagnosis present

## 2017-04-28 DIAGNOSIS — C799 Secondary malignant neoplasm of unspecified site: Secondary | ICD-10-CM

## 2017-04-28 DIAGNOSIS — R042 Hemoptysis: Secondary | ICD-10-CM | POA: Diagnosis not present

## 2017-04-28 DIAGNOSIS — C159 Malignant neoplasm of esophagus, unspecified: Secondary | ICD-10-CM

## 2017-04-28 DIAGNOSIS — C772 Secondary and unspecified malignant neoplasm of intra-abdominal lymph nodes: Secondary | ICD-10-CM | POA: Diagnosis present

## 2017-04-28 DIAGNOSIS — R0600 Dyspnea, unspecified: Secondary | ICD-10-CM | POA: Diagnosis not present

## 2017-04-28 DIAGNOSIS — J9601 Acute respiratory failure with hypoxia: Secondary | ICD-10-CM | POA: Diagnosis not present

## 2017-04-28 DIAGNOSIS — R180 Malignant ascites: Secondary | ICD-10-CM

## 2017-04-28 DIAGNOSIS — K652 Spontaneous bacterial peritonitis: Secondary | ICD-10-CM | POA: Diagnosis present

## 2017-04-28 DIAGNOSIS — Z91013 Allergy to seafood: Secondary | ICD-10-CM

## 2017-04-28 DIAGNOSIS — E860 Dehydration: Secondary | ICD-10-CM | POA: Diagnosis present

## 2017-04-28 DIAGNOSIS — C7951 Secondary malignant neoplasm of bone: Secondary | ICD-10-CM | POA: Diagnosis present

## 2017-04-28 DIAGNOSIS — I959 Hypotension, unspecified: Secondary | ICD-10-CM | POA: Diagnosis not present

## 2017-04-28 DIAGNOSIS — D630 Anemia in neoplastic disease: Secondary | ICD-10-CM | POA: Diagnosis present

## 2017-04-28 DIAGNOSIS — J302 Other seasonal allergic rhinitis: Secondary | ICD-10-CM | POA: Diagnosis present

## 2017-04-28 DIAGNOSIS — J811 Chronic pulmonary edema: Secondary | ICD-10-CM

## 2017-04-28 DIAGNOSIS — R079 Chest pain, unspecified: Secondary | ICD-10-CM | POA: Diagnosis not present

## 2017-04-28 DIAGNOSIS — A419 Sepsis, unspecified organism: Secondary | ICD-10-CM | POA: Diagnosis present

## 2017-04-28 DIAGNOSIS — M5126 Other intervertebral disc displacement, lumbar region: Secondary | ICD-10-CM | POA: Diagnosis present

## 2017-04-28 DIAGNOSIS — C778 Secondary and unspecified malignant neoplasm of lymph nodes of multiple regions: Secondary | ICD-10-CM | POA: Diagnosis not present

## 2017-04-28 DIAGNOSIS — I4891 Unspecified atrial fibrillation: Secondary | ICD-10-CM | POA: Diagnosis not present

## 2017-04-28 DIAGNOSIS — J90 Pleural effusion, not elsewhere classified: Secondary | ICD-10-CM

## 2017-04-28 DIAGNOSIS — Z5111 Encounter for antineoplastic chemotherapy: Secondary | ICD-10-CM | POA: Diagnosis present

## 2017-04-28 DIAGNOSIS — J9621 Acute and chronic respiratory failure with hypoxia: Secondary | ICD-10-CM | POA: Diagnosis present

## 2017-04-28 DIAGNOSIS — C155 Malignant neoplasm of lower third of esophagus: Secondary | ICD-10-CM

## 2017-04-28 DIAGNOSIS — I480 Paroxysmal atrial fibrillation: Secondary | ICD-10-CM | POA: Diagnosis not present

## 2017-04-28 DIAGNOSIS — G9340 Encephalopathy, unspecified: Secondary | ICD-10-CM | POA: Diagnosis not present

## 2017-04-28 DIAGNOSIS — Z515 Encounter for palliative care: Secondary | ICD-10-CM | POA: Diagnosis not present

## 2017-04-28 DIAGNOSIS — Z882 Allergy status to sulfonamides status: Secondary | ICD-10-CM

## 2017-04-28 DIAGNOSIS — C787 Secondary malignant neoplasm of liver and intrahepatic bile duct: Secondary | ICD-10-CM

## 2017-04-28 DIAGNOSIS — C7801 Secondary malignant neoplasm of right lung: Secondary | ICD-10-CM | POA: Diagnosis present

## 2017-04-28 DIAGNOSIS — C7989 Secondary malignant neoplasm of other specified sites: Secondary | ICD-10-CM | POA: Diagnosis present

## 2017-04-28 DIAGNOSIS — J9600 Acute respiratory failure, unspecified whether with hypoxia or hypercapnia: Secondary | ICD-10-CM | POA: Diagnosis not present

## 2017-04-28 DIAGNOSIS — C7800 Secondary malignant neoplasm of unspecified lung: Secondary | ICD-10-CM | POA: Diagnosis not present

## 2017-04-28 DIAGNOSIS — Z66 Do not resuscitate: Secondary | ICD-10-CM | POA: Diagnosis present

## 2017-04-28 DIAGNOSIS — D6481 Anemia due to antineoplastic chemotherapy: Secondary | ICD-10-CM | POA: Diagnosis present

## 2017-04-28 DIAGNOSIS — R600 Localized edema: Secondary | ICD-10-CM | POA: Diagnosis not present

## 2017-04-28 DIAGNOSIS — I471 Supraventricular tachycardia: Secondary | ICD-10-CM | POA: Diagnosis not present

## 2017-04-28 DIAGNOSIS — I4719 Other supraventricular tachycardia: Secondary | ICD-10-CM

## 2017-04-28 DIAGNOSIS — Z7952 Long term (current) use of systemic steroids: Secondary | ICD-10-CM

## 2017-04-28 DIAGNOSIS — Z9981 Dependence on supplemental oxygen: Secondary | ICD-10-CM | POA: Diagnosis not present

## 2017-04-28 DIAGNOSIS — Z888 Allergy status to other drugs, medicaments and biological substances status: Secondary | ICD-10-CM

## 2017-04-28 DIAGNOSIS — J189 Pneumonia, unspecified organism: Secondary | ICD-10-CM

## 2017-04-28 DIAGNOSIS — Z8501 Personal history of malignant neoplasm of esophagus: Secondary | ICD-10-CM

## 2017-04-28 DIAGNOSIS — R634 Abnormal weight loss: Secondary | ICD-10-CM | POA: Diagnosis not present

## 2017-04-28 DIAGNOSIS — R590 Localized enlarged lymph nodes: Secondary | ICD-10-CM | POA: Diagnosis present

## 2017-04-28 DIAGNOSIS — Z8049 Family history of malignant neoplasm of other genital organs: Secondary | ICD-10-CM

## 2017-04-28 DIAGNOSIS — Z87891 Personal history of nicotine dependence: Secondary | ICD-10-CM

## 2017-04-28 DIAGNOSIS — Z973 Presence of spectacles and contact lenses: Secondary | ICD-10-CM

## 2017-04-28 LAB — URINALYSIS, ROUTINE W REFLEX MICROSCOPIC
BILIRUBIN URINE: NEGATIVE
Glucose, UA: NEGATIVE mg/dL
Hgb urine dipstick: NEGATIVE
KETONES UR: NEGATIVE mg/dL
LEUKOCYTES UA: NEGATIVE
NITRITE: NEGATIVE
Protein, ur: NEGATIVE mg/dL
pH: 6 (ref 5.0–8.0)

## 2017-04-28 LAB — MAGNESIUM: Magnesium: 1.6 mg/dL — ABNORMAL LOW (ref 1.7–2.4)

## 2017-04-28 LAB — CBC
HCT: 35.4 % — ABNORMAL LOW (ref 39.0–52.0)
Hemoglobin: 11.3 g/dL — ABNORMAL LOW (ref 13.0–17.0)
MCH: 29 pg (ref 26.0–34.0)
MCHC: 31.9 g/dL (ref 30.0–36.0)
MCV: 91 fL (ref 78.0–100.0)
Platelets: 310 10*3/uL (ref 150–400)
RBC: 3.89 MIL/uL — ABNORMAL LOW (ref 4.22–5.81)
RDW: 18.9 % — ABNORMAL HIGH (ref 11.5–15.5)
WBC: 22.1 10*3/uL — ABNORMAL HIGH (ref 4.0–10.5)

## 2017-04-28 LAB — BASIC METABOLIC PANEL
ANION GAP: 11 (ref 5–15)
BUN: 25 mg/dL — ABNORMAL HIGH (ref 6–20)
CHLORIDE: 104 mmol/L (ref 101–111)
CO2: 24 mmol/L (ref 22–32)
Calcium: 9.1 mg/dL (ref 8.9–10.3)
Creatinine, Ser: 0.71 mg/dL (ref 0.61–1.24)
GFR calc Af Amer: >60 mL/min (ref >60)
GFR calc non Af Amer: >60 mL/min (ref >60)
Glucose, Bld: 118 mg/dL — ABNORMAL HIGH (ref 65–99)
Potassium: 3.9 mmol/L (ref 3.5–5.1)
Sodium: 139 mmol/L (ref 135–145)

## 2017-04-28 LAB — PHOSPHORUS: Phosphorus: 2.9 mg/dL (ref 2.5–4.6)

## 2017-04-28 LAB — I-STAT CG4 LACTIC ACID, ED
Lactic Acid, Venous: 1.95 mmol/L — ABNORMAL HIGH (ref 0.5–1.9)
Lactic Acid, Venous: 1.96 mmol/L — ABNORMAL HIGH (ref 0.5–1.9)

## 2017-04-28 LAB — I-STAT TROPONIN, ED: TROPONIN I, POC: 0.02 ng/mL (ref 0.00–0.08)

## 2017-04-28 LAB — BRAIN NATRIURETIC PEPTIDE: B Natriuretic Peptide: 193.8 pg/mL — ABNORMAL HIGH (ref 0.0–100.0)

## 2017-04-28 MED ORDER — IPRATROPIUM-ALBUTEROL 0.5-2.5 (3) MG/3ML IN SOLN
3.0000 mL | Freq: Once | RESPIRATORY_TRACT | Status: AC
  Administered 2017-04-28: 3 mL via RESPIRATORY_TRACT
  Filled 2017-04-28: qty 3

## 2017-04-28 MED ORDER — DILTIAZEM LOAD VIA INFUSION
15.0000 mg | Freq: Once | INTRAVENOUS | Status: AC
  Administered 2017-04-28: 15 mg via INTRAVENOUS
  Filled 2017-04-28: qty 15

## 2017-04-28 MED ORDER — VANCOMYCIN HCL 10 G IV SOLR
1500.0000 mg | Freq: Once | INTRAVENOUS | Status: AC
  Administered 2017-04-28: 1500 mg via INTRAVENOUS
  Filled 2017-04-28: qty 1500

## 2017-04-28 MED ORDER — SODIUM CHLORIDE 0.9 % IV BOLUS (SEPSIS)
1000.0000 mL | Freq: Once | INTRAVENOUS | Status: AC
  Administered 2017-04-28: 1000 mL via INTRAVENOUS

## 2017-04-28 MED ORDER — SODIUM CHLORIDE 0.9 % IV BOLUS (SEPSIS): 1000.0000 mL | Freq: Once | INTRAVENOUS | Status: DC

## 2017-04-28 MED ORDER — IOPAMIDOL (ISOVUE-370) INJECTION 76%
100.0000 mL | Freq: Once | INTRAVENOUS | Status: AC | PRN
  Administered 2017-04-28: 100 mL via INTRAVENOUS

## 2017-04-28 MED ORDER — SODIUM CHLORIDE 0.9 % IV SOLN: 250.0000 mL | INTRAVENOUS | Status: DC | PRN

## 2017-04-28 MED ORDER — DILTIAZEM HCL-DEXTROSE 100-5 MG/100ML-% IV SOLN (PREMIX)
5.0000 mg/h | INTRAVENOUS | Status: DC
  Administered 2017-04-28 – 2017-05-01 (×3): 5 mg/h via INTRAVENOUS
  Administered 2017-05-02 (×2): 15 mg/h via INTRAVENOUS
  Filled 2017-04-28 (×6): qty 100

## 2017-04-28 MED ORDER — SODIUM CHLORIDE 0.9 % IV BOLUS (SEPSIS): 250.0000 mL | Freq: Once | INTRAVENOUS | Status: DC

## 2017-04-28 MED ORDER — PREDNISONE 20 MG PO TABS
60.0000 mg | ORAL_TABLET | Freq: Once | ORAL | Status: AC
Start: 2017-04-28 — End: 2017-04-28
  Administered 2017-04-28: 60 mg via ORAL
  Filled 2017-04-28: qty 3

## 2017-04-28 MED ORDER — SODIUM CHLORIDE 0.9 % IV SOLN
2.0000 g | Freq: Once | INTRAVENOUS | Status: AC
  Administered 2017-04-28: 2 g via INTRAVENOUS
  Filled 2017-04-28: qty 2

## 2017-04-28 MED ORDER — PANTOPRAZOLE SODIUM 40 MG IV SOLR
40.0000 mg | INTRAVENOUS | Status: DC
  Administered 2017-04-29 – 2017-05-01 (×3): 40 mg via INTRAVENOUS
  Filled 2017-04-28 (×3): qty 40

## 2017-04-28 MED ORDER — HEPARIN SODIUM (PORCINE) 5000 UNIT/ML IJ SOLN
5000.0000 [IU] | Freq: Three times a day (TID) | INTRAMUSCULAR | Status: DC
  Filled 2017-04-28 (×2): qty 1

## 2017-04-28 MED ORDER — SODIUM CHLORIDE 0.9 % IV SOLN
1.0000 g | Freq: Once | INTRAVENOUS | Status: DC
  Filled 2017-04-28: qty 1

## 2017-04-28 MED ORDER — SODIUM CHLORIDE 0.9 % IV BOLUS
500.0000 mL | Freq: Once | INTRAVENOUS | Status: AC
  Administered 2017-04-28: 500 mL via INTRAVENOUS

## 2017-04-28 MED ORDER — ALBUTEROL SULFATE (2.5 MG/3ML) 0.083% IN NEBU
5.0000 mg | INHALATION_SOLUTION | Freq: Once | RESPIRATORY_TRACT | Status: AC
  Administered 2017-04-28: 5 mg via RESPIRATORY_TRACT
  Filled 2017-04-28: qty 6

## 2017-04-28 MED ORDER — IOPAMIDOL (ISOVUE-370) INJECTION 76%
INTRAVENOUS | Status: AC
  Filled 2017-04-28: qty 100

## 2017-04-28 NOTE — ED Notes (Signed)
ED TO INPATIENT HANDOFF REPORT  Name/Age/Gender Grant Todd 62 y.o. male  Code Status    Code Status Orders  (From admission, onward)        Start     Ordered   04/28/17 2136  Do not attempt resuscitation (DNR)  Continuous    Question Answer Comment  In the event of cardiac or respiratory ARREST Do not call a "code blue"   In the event of cardiac or respiratory ARREST Do not perform Intubation, CPR, defibrillation or ACLS   In the event of cardiac or respiratory ARREST Use medication by any route, position, wound care, and other measures to relive pain and suffering. May use oxygen, suction and manual treatment of airway obstruction as needed for comfort.      04/28/17 2135    Code Status History    Date Active Date Inactive Code Status Order ID Comments User Context   04/28/2017 2127 04/28/2017 2135 DNR [id number]  Kandice Hams, MD ED      Home/SNF/Other Home  Chief Complaint sob  Level of Care/Admitting Diagnosis ED Disposition    ED Disposition Condition [place]: [facility] [[id number]]  Level of Care: ICU [6]  Diagnosis: Sepsis [facility]) [[id number]]  Admitting Physician: Kandice Hams [[id number]]  Attending Physician: Kandice Hams [[id number]]  Estimated length of stay: 3 - 4 days  Certification:: I certify this patient will need inpatient services for at least 2 midnights  PT Class (Do Not Modify): Inpatient [101]  PT Acc Code (Do Not Modify): Private [1]       Medical History Past Medical History:  Diagnosis Date  . Elevated LFTs   . Esophageal mass   . Pleural effusion on left   . Posterior tibial tendon dysfunction, right   . Rheumatoid arthritis ([place])   . Seasonal allergic rhinitis   . Severe esophageal dysplasia   . Tobacco use     Allergies Allergies  Allergen Reactions  . Chantix [Varenicline] Other (See Comments)    Vivid dreams and lethargic  . Methotrexate Derivatives     Caused  pleural effusions  . Shellfish Allergy Hives  . Sulfonamide Derivatives Rash    IV Location/Drains/Wounds Patient Lines/Drains/Airways Status   Active Line/Drains/Airways    Name:   Placement date:   Placement time:   Site:   Days:   Implanted Port 01/04/17 Right Chest   01/04/17    -    Chest   114   Peripheral IV 04/28/17 Left Hand   04/28/17    2118    Hand   less than 1          Labs/Imaging Results for orders placed or performed during the hospital encounter of 04/28/17 (from the past 48 hour(s))  CBC     Status: Abnormal   Collection Time: 04/28/17  6:08 PM  Result Value Ref Range   WBC 22.1 (H) 4.0 - 10.5 K/uL   RBC 3.89 (L) 4.22 - 5.81 MIL/uL   Hemoglobin 11.3 (L) 13.0 - 17.0 g/dL   HCT 35.4 (L) 39.0 - 52.0 %   MCV 91.0 78.0 - 100.0 fL   MCH 29.0 26.0 - 34.0 pg   MCHC 31.9 30.0 - 36.0 g/dL   RDW 18.9 (H) 11.5 - 15.5 %   Platelets 310 150 - 400 K/uL    Comment: Performed at [facility], [place] [address], [place], [place] [address]  Brain natriuretic peptide  Status: Abnormal   Collection Time: 04/28/17  6:08 PM  Result Value Ref Range   B Natriuretic Peptide 193.8 (H) 0.0 - 100.0 pg/mL    Comment: Performed at [facility], [place] [address] [place], [place] [address]  Basic metabolic panel     Status: Abnormal   Collection Time: 04/28/17  6:08 PM  Result Value Ref Range   Sodium 139 135 - 145 mmol/L   Potassium 3.9 3.5 - 5.1 mmol/L   Chloride 104 101 - 111 mmol/L   CO2 24 22 - 32 mmol/L   Glucose, Bld 118 (H) 65 - 99 mg/dL   BUN 25 (H) 6 - 20 mg/dL   Creatinine, Ser 0.71 0.61 - 1.24 mg/dL   Calcium 9.1 8.9 - 10.3 mg/dL   GFR calc non Af Amer >60 >60 mL/min   GFR calc Af Amer >60 >60 mL/min    Comment: (NOTE) The eGFR has been calculated using the CKD EPI equation. This calculation has not been validated in all clinical situations. eGFR's persistently <60 mL/min signify possible Chronic Kidney Disease.    Anion gap 11  5 - 15    Comment: Performed at [facility], [place] [address], [place], [place] [address]  I-stat troponin, ED  (not at [facility], [facility])     Status: None   Collection Time: 04/28/17  6:12 PM  Result Value Ref Range   Troponin i, poc 0.02 0.00 - 0.08 ng/mL   Comment 3            Comment: Due to the release kinetics of cTnI, a negative result within the first hours of the onset of symptoms does not rule out myocardial infarction with certainty. If myocardial infarction is still suspected, repeat the test at appropriate intervals.   I-Stat CG4 Lactic Acid, ED     Status: Abnormal   Collection Time: 04/28/17  9:23 PM  Result Value Ref Range   Lactic Acid, Venous 1.95 (H) 0.5 - 1.9 mmol/L  Urinalysis, Routine w reflex microscopic     Status: Abnormal   Collection Time: 04/28/17 10:28 PM  Result Value Ref Range   Color, Urine YELLOW YELLOW   APPearance CLEAR CLEAR   Specific Gravity, Urine >1.046 (H) 1.005 - 1.030   pH 6.0 5.0 - 8.0   Glucose, UA NEGATIVE NEGATIVE mg/dL   Hgb urine dipstick NEGATIVE NEGATIVE   Bilirubin Urine NEGATIVE NEGATIVE   Ketones, ur NEGATIVE NEGATIVE mg/dL   Protein, ur NEGATIVE NEGATIVE mg/dL   Nitrite NEGATIVE NEGATIVE   Leukocytes, UA NEGATIVE NEGATIVE    Comment: Performed at [place] [address] [place], [place] [address]  I-Stat CG4 Lactic Acid, ED  (not at  [facility])     Status: Abnormal   Collection Time: 04/28/17 10:52 PM  Result Value Ref Range   Lactic Acid, Venous 1.96 (H) 0.5 - 1.9 mmol/L   Ct Angio Chest Pe W And/or Wo Contrast  Result Date: 04/28/2017 CLINICAL DATA:  Esophageal cancer. Ongoing chemotherapy. Patient complains of shortness breath. EXAM: CT ANGIOGRAPHY CHEST WITH CONTRAST TECHNIQUE: Multidetector CT imaging of the chest was performed using the standard protocol during bolus administration of intravenous contrast. Multiplanar CT image reconstructions and MIPs were obtained to evaluate the  vascular anatomy. CONTRAST:  173m ISOVUE-370 IOPAMIDOL (ISOVUE-370) INJECTION 76% COMPARISON:  04/21/2017 FINDINGS: Cardiovascular: Satisfactory opacification of the pulmonary arteries to the segmental level. No evidence of pulmonary embolism. Mildly enlarged heart. Tiny pericardial effusion. Mild coronary artery  disease. Central venous catheter terminates in the distal superior vena cava. Mediastinum/Nodes: Enlarged bilateral mediastinal lymph nodes are not significantly changed with the largest node in right pretracheal location measuring 1.9 cm. Again seen is circumferential thickening of the distal esophagus just above the GE junction. The thyroid is grossly unremarkable. Lungs/Pleura: Right upper lobe pulmonary mass appears more fragmented and measures approximately 4.4 x 5.1 cm. There is subpleural soft tissue thickening extending inferiorly along the anterior pleura of the right upper lobe and right middle lobe, likely representing extension of disease. There is persistent interstitial thickening in the right upper lobe and right middle lobe suggestive of lymphangitic spread of disease. Interval development of interstitial thickening in the left lung, with upper lobe predominance. Stable moderate in size left pleural effusion. The right pleural effusion demonstrates loculations. Upper Abdomen: Interval development of water density pleural effusion, moderate. Musculoskeletal: No chest wall abnormality. No acute or significant osseous findings. Review of the MIP images confirms the above findings. IMPRESSION: No evidence of pulmonary embolus. Previously demonstrated right upper lobe malignancy appears more fragmented and measures slightly smaller at 5.1 cm in greatest dimension. Progression of lymphangitic spread of disease in the right upper and right middle lobes. Interval development of interstitial thickening throughout the left lung with upper lobe predominance. This may represent contralateral  lymphangitic spread of malignancy versus development of pulmonary edema. Interval development of loculations within the known right pleural effusion. Stable moderate in size left pleural effusion. Interval development of abdominal ascites. Stable thickening of the distal esophagus. Electronically Signed   By: Fidela Salisbury M.D.   On: 04/28/2017 19:59   Dg Chest Port 1 View  Result Date: 04/28/2017 CLINICAL DATA:  Shortness of breath EXAM: PORTABLE CHEST 1 VIEW COMPARISON:  04/24/2017, CT chest 04/21/2017 FINDINGS: Right-sided central venous port tip over the SVC. Small left greater than right pleural effusion, similar on the left slight increase on the right. Progressive consolidation in the right upper lung. Stable cardiomediastinal silhouette. Vascular congestion. Diffuse interstitial opacity as before. IMPRESSION: 1. Similar appearance of left pleural effusion. Slight increased small right pleural effusion 2. Worsening consolidation in the right mid to upper lung which may reflect combination of mass and adjacent pneumonia or inflammatory process 3. Similar appearance of diffuse interstitial disease as compared with recent priors. Electronically Signed   By: Donavan Foil M.D.   On: 04/28/2017 18:28    Pending Labs Unresulted Labs (From admission, onward)   Start     Ordered   04/29/17 0500  Procalcitonin  Daily,   R     04/28/17 2132   04/29/17 1610  Basic metabolic panel  Tomorrow morning,   R     04/28/17 2133   04/29/17 0500  CBC  Tomorrow morning,   R     04/28/17 2133   04/29/17 0500  Magnesium  Tomorrow morning,   R     04/28/17 2133   04/29/17 0500  Phosphorus  Tomorrow morning,   R     04/28/17 2133   04/28/17 2242  Respiratory Panel by PCR  (Respiratory virus panel)  Once,   R     04/28/17 2241   04/28/17 2138  Culture, expectorated sputum-assessment  Once,   R     04/28/17 2137   04/28/17 2137  Magnesium  STAT,   R     04/28/17 2136   04/28/17 2137  Phosphorus  STAT,   R      04/28/17 2136   04/28/17 2133  Procalcitonin - Baseline  STAT,   STAT     04/28/17 2132   04/28/17 2133  Lactic acid, plasma  STAT Now then every 3 hours,   R     04/28/17 2132   04/28/17 1848  Blood culture (routine x 2)  BLOOD CULTURE X 2,   STAT     04/28/17 1848      Vitals/Pain Today's Vitals   04/28/17 1654 04/28/17 2018 04/28/17 2047 04/28/17 2219  BP:    110/78  Pulse:  (!) 166  64  Resp:  (!) 31  (!) 34  Temp:      TempSrc:      SpO2:  100% 100% 100%  Weight: 163 lb (73.9 kg)   163 lb (73.9 kg)  Height: '5\' 8"'$  (1.727 m)   '5\' 8"'$  (3.664 m)  PainSc:        Isolation Precautions Droplet precaution  Medications Medications  vancomycin (VANCOCIN) 1,500 mg in sodium chloride 0.9 % 500 mL IVPB (1,500 mg Intravenous New Bag/Given 04/28/17 2116)  ceFEPIme (MAXIPIME) 2 g in sodium chloride 0.9 % 100 mL IVPB (2 g Intravenous New Bag/Given 04/28/17 2245)  iopamidol (ISOVUE-370) 76 % injection (has no administration in time range)  diltiazem (CARDIZEM) 1 mg/mL load via infusion 15 mg (15 mg Intravenous Bolus from Bag 04/28/17 1948)    And  diltiazem (CARDIZEM) 100 mg in dextrose 5% 118m (1 mg/mL) infusion (10 mg/hr Intravenous Rate/Dose Change 04/28/17 2004)  sodium chloride 0.9 % bolus 1,000 mL ( Intravenous Rate/Dose Change 04/28/17 2250)    And  sodium chloride 0.9 % bolus 1,000 mL (0 mLs Intravenous Stopped 04/28/17 2150)    And  sodium chloride 0.9 % bolus 250 mL (has no administration in time range)  pantoprazole (PROTONIX) injection 40 mg (has no administration in time range)  0.9 %  sodium chloride infusion (has no administration in time range)  heparin injection 5,000 Units (has no administration in time range)  ipratropium-albuterol (DUONEB) 0.5-2.5 (3) MG/3ML nebulizer solution 3 mL (3 mLs Nebulization Given 04/28/17 1843)  predniSONE (DELTASONE) tablet 60 mg (60 mg Oral Given 04/28/17 1859)  sodium chloride 0.9 % bolus 500 mL (0 mLs Intravenous Stopped 04/28/17 2236)   albuterol (PROVENTIL) (2.5 MG/3ML) 0.083% nebulizer solution 5 mg (5 mg Nebulization Given 04/28/17 2047)  iopamidol (ISOVUE-370) 76 % injection 100 mL (100 mLs Intravenous Contrast Given 04/28/17 1917)    Mobility walks

## 2017-04-28 NOTE — ED Notes (Signed)
Bed: JL87 Expected date:  Expected time:  Means of arrival:  Comments: Ca ctr pt

## 2017-04-28 NOTE — Consult Note (Addendum)
THIS IS THE HISTORY AND PHYSICAL.. ..  Name: Grant Todd [name]: [id number] DOB: [birth year]    ADMISSION DATE:  04/28/2017 CONSULTATION DATE:  04/28/17  REFERRING MD :  Laurann Montana MD  CHIEF COMPLAINT:  SOB\  \BRIEF PATIENT DESCRIPTION: 62 yr old male with metastatic esophageal cancer s/p 8 cycles of FOLCOX and recently started on pembrolizumab on 04/26/17 s/p thoracentesis of right pleural effusion on 04/24/17  Now presents with shortness of breath from pulmonary office and increased HR.   SIGNIFICANT EVENTS  SOB not improved with Franquez started on NIPPV  HR 170s started on Cardizem   STUDIES:  CXR shows moderate left effusion and small right loculated effusion with underlying lymphangitic spread and existing metastatic disease. CTA showed no PE.  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 Has received 8 cycles of FOLFOX despite this lymphangitic spread to the right lung with pleural effusion and was started with pembrolizumab 3/27. Pt denies any adverse reactions while receiving chemotherapy on 27th.   Pt was seen at outpatient pulmonologist today his O2 sats were in the 80s and not improving along with increased HR and they sent him into the ER.   Per EDP's documentation: Pt has been getting short of breath esp on exertion over the last several weeks and although he received a thoracentesis on Monday it did not help much.  On my evaluation pt is wearing a NIPPV he is alert and verbal. He is accompanied by his wife.  He states that his breathing has improved with current interventions.  He denies chest or abdominal pain, fevers, chills, constipation, diarrhea. He does notice a new  rash on his hands that has improved since coming to the hospital and was not there prior to the 27th.   PAST MEDICAL HISTORY :   has a past medical history of Elevated LFTs, Esophageal mass, Pleural effusion on left, Posterior tibial tendon dysfunction, right, Rheumatoid arthritis ([place]), Seasonal allergic rhinitis, Severe esophageal dysplasia, and Tobacco use.  has a past surgical history that includes Cholecystectomy; Tonsillectomy; IR FLUORO GUIDE PORT INSERTION RIGHT (01/04/2017); IR US Guide Vasc Access Right (01/04/2017); and IR US Guide Bx Asp/Drain (01/04/2017). Prior to Admission medications   Medication Sig Start Date End Date Taking? Authorizing Provider  amoxicillin-clavulanate (AUGMENTIN) 875-125 MG tablet Take 1 tablet by mouth 2 (two) times daily. 04/21/17  Yes Owens Shark, NP  HYDROcodone-acetaminophen (NORCO/VICODIN) 5-325 MG tablet Take 1 tablet by mouth every 4 (four) hours as needed for moderate pain or severe pain.    Yes [provider]  morphine (MS CONTIN) 15 MG 12 hr tablet Take 1 tablet (15 mg total) by mouth every 12 (twelve) hours. Patient taking differently: Take 15 mg by mouth at bedtime.  04/26/17  Yes Ladell Pier, MD  pantoprazole (PROTONIX) 40 MG tablet TAKE ONE (1) TABLET BY MOUTH EVERY DAY 04/12/17  Yes Owens Shark, NP  predniSONE (DELTASONE) 5 MG tablet Take 1 tablet by mouth daily. 11/21/16  Yes [provider]  pseudoephedrine-guaifenesin (MUCINEX D) 60-600 MG 12 hr tablet Take 1 tablet by mouth every 12 (twelve) hours.   Yes [provider]  lidocaine-prilocaine (EMLA) cream Apply 1 application topically as needed. Apply a small amount of emla cream on  a cotton ball and place cream over port site 1-2 hours prior to treatmetn. Do not rub in . Cover with plastic wrap 01/18/17   Ladell Pier, MD   Allergies  Allergen Reactions  . Chantix [Varenicline] Other (See Comments)    Vivid dreams and lethargic  . Methotrexate  Derivatives     Caused pleural effusions  . Shellfish Allergy Hives  . Sulfonamide Derivatives Rash    FAMILY HISTORY:  family history includes CVA in his father; Head & neck cancer in his father; Hyperlipidemia in his father; Testicular cancer in his son; Uterine cancer in his mother. SOCIAL HISTORY:  reports that he quit smoking about 16 years ago. His smoking use included cigarettes. He has never used smokeless tobacco. He reports that he drinks alcohol. He reports that he does not use drugs.  REVIEW OF SYSTEMS:  ( bolded items are pertinent negatives or positives) Constitutional: Negative for fever, chills, weight loss, malaise/fatigue and diaphoresis.  HENT: Negative for hearing loss, ear pain, nosebleeds, congestion, sore throat, neck pain, tinnitus and ear discharge.   Eyes: Negative for blurred vision, double vision, photophobia, pain, discharge and redness.  Respiratory: Negative for cough, hemoptysis, sputum production, +shortness of breath, wheezing and stridor.  Cardiovascular: Negative for chest pain, +palpitations, orthopnea, claudication, leg swelling and PND.  Gastrointestinal: Negative for heartburn, nausea, vomiting, abdominal pain, diarrhea, constipation, blood in stool and melena.  Genitourinary: Negative for dysuria, urgency, frequency, hematuria and flank pain.  Musculoskeletal: Negative for myalgias, back pain, joint pain and falls.  Skin: new rash on his hands denies itching Neurological: Negative for dizziness, tingling, tremors, sensory change, speech change, focal weakness, seizures, loss of consciousness, weakness and headaches.  Endo/Heme/Allergies: Negative for environmental allergies and polydipsia. Does not bruise/bleed easily.  SUBJECTIVE:   VITAL SIGNS: Temp:  [98.1 F (36.7 C)-98.3 F (36.8 C)] 98.1 F (36.7 C) (03/29 1651) Pulse Rate:  [69-166] 166 (03/29 2018) Resp:  [19-31] 31 (03/29 2018) BP: (98-100)/(63-79) 98/79 (03/29 1651) SpO2:  [94 %-100  %] 100 % (03/29 2018) Weight:  [73.9 kg (163 lb)-74.1 kg (163 lb 6.4 oz)] 73.9 kg (163 lb) (03/29 1654)  PHYSICAL EXAMINATION: General:  Alert well nourished in good spirits Neuro:  No focal deficits appreciated GCS 15 HEENT:  NIPPV mask on with good seal.  Cardiovascular:  Increased rate, S1 and S2 appreciated, pulses equal Lungs:  Coarse breath sounds bilaterally with decreased breath sounds at bilateral bases + crackles on left Abdomen:  Soft non tender + BS Musculoskeletal:  No gross deformities Skin:  Non blanching ertyhematous rash on bilateral hands not involving palms.  Recent Labs  Lab 04/26/17 0757 04/28/17 1808  NA 142 139  K 4.1 3.9  CL 107 104  CO2 22 24  BUN 23 25*  CREATININE 0.81 0.71  GLUCOSE 133 118*   Recent Labs  Lab 04/26/17 0757 04/28/17 1808  HGB  --  11.3*  HCT 39.3 35.4*  WBC 18.1* 22.1*  PLT 286 310   Ct Angio Chest Pe W And/or Wo Contrast  Result Date: 04/28/2017 CLINICAL DATA:  Esophageal cancer. Ongoing chemotherapy. Patient complains of shortness breath. EXAM: CT ANGIOGRAPHY CHEST WITH CONTRAST TECHNIQUE: Multidetector CT imaging of the chest was performed using the standard protocol during bolus administration of intravenous contrast. Multiplanar CT image reconstructions and MIPs were obtained to evaluate the vascular anatomy. CONTRAST:  111mL ISOVUE-370 IOPAMIDOL (ISOVUE-370) INJECTION 76% COMPARISON:  04/21/2017 FINDINGS: Cardiovascular: Satisfactory opacification of the pulmonary arteries to the segmental level.  No evidence of pulmonary embolism. Mildly enlarged heart. Tiny pericardial effusion. Mild coronary artery disease. Central venous catheter terminates in the distal superior vena cava. Mediastinum/Nodes: Enlarged bilateral mediastinal lymph nodes are not significantly changed with the largest node in right pretracheal location measuring 1.9 cm. Again seen is circumferential thickening of the distal esophagus just above the GE junction. The  thyroid is grossly unremarkable. Lungs/Pleura: Right upper lobe pulmonary mass appears more fragmented and measures approximately 4.4 x 5.1 cm. There is subpleural soft tissue thickening extending inferiorly along the anterior pleura of the right upper lobe and right middle lobe, likely representing extension of disease. There is persistent interstitial thickening in the right upper lobe and right middle lobe suggestive of lymphangitic spread of disease. Interval development of interstitial thickening in the left lung, with upper lobe predominance. Stable moderate in size left pleural effusion. The right pleural effusion demonstrates loculations. Upper Abdomen: Interval development of water density pleural effusion, moderate. Musculoskeletal: No chest wall abnormality. No acute or significant osseous findings. Review of the MIP images confirms the above findings. IMPRESSION: No evidence of pulmonary embolus. Previously demonstrated right upper lobe malignancy appears more fragmented and measures slightly smaller at 5.1 cm in greatest dimension. Progression of lymphangitic spread of disease in the right upper and right middle lobes. Interval development of interstitial thickening throughout the left lung with upper lobe predominance. This may represent contralateral lymphangitic spread of malignancy versus development of pulmonary edema. Interval development of loculations within the known right pleural effusion. Stable moderate in size left pleural effusion. Interval development of abdominal ascites. Stable thickening of the distal esophagus. Electronically Signed   By: Fidela Salisbury M.D.   On: 04/28/2017 19:59   Dg Chest Port 1 View  Result Date: 04/28/2017 CLINICAL DATA:  Shortness of breath EXAM: PORTABLE CHEST 1 VIEW COMPARISON:  04/24/2017, CT chest 04/21/2017 FINDINGS: Right-sided central venous port tip over the SVC. Small left greater than right pleural effusion, similar on the left slight increase  on the right. Progressive consolidation in the right upper lung. Stable cardiomediastinal silhouette. Vascular congestion. Diffuse interstitial opacity as before. IMPRESSION: 1. Similar appearance of left pleural effusion. Slight increased small right pleural effusion 2. Worsening consolidation in the right mid to upper lung which may reflect combination of mass and adjacent pneumonia or inflammatory process 3. Similar appearance of diffuse interstitial disease as compared with recent priors. Electronically Signed   By: Donavan Foil M.D.   On: 04/28/2017 18:28    ASSESSMENT / PLAN: NEURO: No acute issues No history of mets to the brain GCS 15 Not in any pain at this time  CARDIAC: Multifocal Atrial Tachycardia secondary to Respiratory status HR has improved to 90s on my evaluation Pt continues on Cardizem Trace pericardial effusion on CTA Hemodynamically stable not requiring vasopressors ( MAP goal>62mmHg) Has a Port a Cath in the right chest wall. PIVs for access. BNP 193 Trop 0.02 and LA 1.95   PULMONARY: Acute Hypoxic Respiratory Failure requiring NIPPV Metastatic Pleural Effusions (small loculated right, moderate left) Lymphangitic Spread and RUL mass with extension disease to RUL RML Interval development of interstitial thickening throughout the left lung with upper lobe predominance S/p Keytruda (Pembrolizumab) on 3/27 CTA negative for PE Given nature of effusions pt may be a candidate for a PleurX on left.  Right effusion is small and loculated not ideal Respiratory issues may also be complicated by worsening ascites.  Post Obstructive Pneumonia? WBC is elevated and effusions although present are stable Cover  with broad spectrum abx Vancomycin and Cefepime RVP, Blood cx, r/o infection Send PCT if negative will deescalate  LA<2  ID: SEPSIS Post Obstructive Pneumonia? WBC is elevated and effusions although present are stable Cover with broad spectrum abx Vancomycin and  Cefepime RVP, Blood cx, r/o infection Send PCT if negative will deescalate  LA<2 Spontaneous Bacterial Peritonitis Interval development of Abdominal ascites  WBC elevated to 22 Started on Vancomycin and Cefepime (cephalopsorin should cover for poss SBP) F/u PCT  Endocrine: No active issues at this time No H/o insulin dependence   GI: NPO while on NIPPV due to risk of aspiration Start on PPI  Heme: No signs of active bleeding Hgb stable No active coagulopathy While in patient  Lovenox for DVT PPx With SCDs  RENAL No active issues @Baseline  Cr with no AKI .Marland Kitchen Lab Results  Component Value Date   CREATININE 0.71 04/28/2017   CREATININE 0.81 04/26/2017   CREATININE 0.85 04/17/2017  Electrolyte goals: K= 4, Mg 2 Phos 2  ..    I, Dr Seward Carol have personally reviewed patient's available data, including medical history, events of note, physical examination and test results as part of my evaluation. I have discussed with NP Dewaine Oats and other care providers such as pharmacist, and RN.The patient is critically ill with multiple organ systems failure and requires high complexity decision making for assessment and support, frequent evaluation and titration of therapies, application of advanced monitoring technologies and extensive interpretation of multiple databases. Critical Care Time devoted to patient care services described in this note is 67 Minutes. This time reflects time of care of this signee Dr Seward Carol. This critical care time does not reflect procedure time, or teaching time or supervisory time of NP but could involve care discussion time    DISPOSITION: ICU CODE STATUS: DNR/ DNI CC TIME: 55 mins PROGNOSIS: [place] FAMILY: Wife at bedside. We had a lengthy conversation regarding his diagnosis and previous treatments and his course so far. We discussed intubation, and CPR. Patient understands that while they may deal with a acute compromise they do not  change his overall prognosis. He has decided with his wife that he does not want CPR and does not want to be intubated. He is willing to continue with BiPAP, thoracentesis or PleurX, antibiotics, and fluids with the goal to address reversible processes.   Signed Dr Seward Carol Pulmonary and Critical Care Medicine  04/28/2017, 8:34 PM

## 2017-04-28 NOTE — Progress Notes (Addendum)
[facility] OFFICE PROGRESS NOTE   Diagnosis: Esophagus cancer  INTERVAL HISTORY:   Grant Todd returns prior to scheduled follow-up for evaluation of increased dyspnea.  Dyspnea has progressively worsened over the past several days.  Oxygen is currently at 3 L.  No fever.  He has a productive cough.  Wife notes pink tinged mucus expectorated at times.  He continues to have right-sided chest pain.  He takes Vicodin during the day.  They note his heart rate varies.  He notes trace swelling at the lower legs.  No calf pain.  Objective:  Vital signs in last 24 hours:  Blood pressure 100/63, pulse 69, temperature 98.3 F (36.8 C), temperature source Oral, resp. rate 19, height '5\' 8"'$  (1.727 m), weight 163 lb 6.4 oz (74.1 kg), SpO2 96 %.    HEENT: No thrush or ulcers. Resp: Rub left lung field.  Diminished breath sounds right lower lung field.  Increased respiratory rate. Cardio: Irregular. GI: Abdomen soft and nontender.  No hepatomegaly. Vascular: Trace edema at the lower legs/ankles bilaterally.  Calves nontender. Neuro: Alert and oriented. Skin: No rash. Port-A-Cath without erythema.   Lab Results:  Lab Results  Component Value Date   WBC 18.1 (H) 04/26/2017   HGB 10.1 (L) 03/01/2017   HCT 39.3 04/26/2017   MCV 92.7 04/26/2017   PLT 286 04/26/2017   NEUTROABS 14.3 (H) 04/26/2017    Imaging:  No results found.  Medications: I have reviewed the patient's current medications.  Assessment/Plan: 1. Metastatic esophagus cancer ? Distal esophagus mass at upper endoscopy 12/15/2016, biopsy confirmed invasive poorly differentiated adenocarcinoma ? CTs of 02/16/2016-distal esophagus thickening, mediastinal/right hilar lymphadenopathy, abdominal/retroperitoneal lymphadenopathy, liver metastases ? MRI lumbar spine 12/20/2016-7 mm S1 lesion suspicious for metastasis, bulky retroperitoneal and prevertebral malignant lymphadenopathy, leftpsoasmuscle metastasis,  degenerative disease causing severe neural foraminal stenosis at right L4 and left L5 ? PET scan 12/27/2016-hypermetabolic mass at the gastroesophageal junction; extensive hypermetabolic adenopathy involving the right supraclavicular nodal station, mediastinal lymph nodes, retrocrural lymph nodes, periaortic lymph nodes, left iliac lymph nodes and central mesenteric lymph nodes; fine nodularity in the right upper lobe with hypermetabolic thickening in the right suprahilar peribronchial structures; hypermetabolic metastasis to the right hepatic lobe; several small skeletal metastasis including right iliac bone, left sacrum and left scapula; multiple soft tissue metastases to the subcutaneous tissues adjacent to the left scapular musculature, retroperitoneal nodal metastasis along the left psoas muscle and metastatic lesion within the left gluteal musculature. No lesion in the lumbar spine or spinal canal to explain the patient's acute right-sided leg pain. ? Ultrasound-guided biopsy of a right liver lesion 01/04/2017-poorly differentiated adenocarcinoma ? PD-L1 expression30% ? Cycle 1 FOLFOX 01/05/2017 ? Cycle 2 FOLFOX 01/19/2017 ? Cycle 3 FOLFOX1/03/2017 ? Cycle 4 FOLFOX 02/15/2017 ? Cycle 5 FOLFOX 03/01/2017 ? CTs 03/13/2017 decreased size and number of lymph nodes in the chest, abdomen, and retroperitoneum. Decreased size of largest liver lesion, slight increase in size of small liver lesion, slight increase in left pleural effusion, development of inflammatory appearing airspace disease ? Cycle 6 FOLFOX 03/15/2017 ? Cycle 7 FOLFOX 03/29/2017 ? Cycle 8 FOLFOX 04/17/2017 (Oxaliplatin discontinued) ? CT chest 04/21/2017- progression of thoracic and upper abdominal nodal metastases, masslike opacity in the right upper lobe-progressive with associated lymphangitic tumor spread in the right lung, new right pleural effusion, unchanged left pleural effusion ? Thoracentesis 04/24/2017- metastatic  adenocarcinoma ? Cycle 1 pembrolizumab 04/26/2017 2. Painpossibly due to disc herniation-the pain may be related to retroperitoneal lymphadenopathy or  bone metastases;plain x-ray right hip 12/21/2016 with no blastic or lytic bone lesions. No fracture or dislocation.Review of the lumbar spine MRI shows right L4 disc herniation which could be responsible for the right leg pain and weakness. Trial of dexamethasone initiatedwith improvement.  3.Rheumatoid arthritis  4.Dysphagia/weight loss secondary to #1-improved 02/01/2017.  5.Chronic left pleural effusion  6.Port-A-Cath placement 01/04/2017  7.Early oxaliplatin neuropathy-diminished vibratory sense;mildly decreased to intact 03/29/2017.  8.Right lung inflammatory changes noted on chest CT 03/13/2017;increased cough 04/12/2017. Chest x-ray 04/12/2017 with similar appearance left chronic pleural effusion. Diffuse airspace disease right lung similar to prior chest CT.   Disposition: Grant Todd has metastatic esophagus cancer.  He was recently found to have disease progression.  He began treatment with Pembrolizumab 04/26/2017.  He presents today with worsening dyspnea.  He has a known malignant right pleural effusion.  The dyspnea may be due to reaccumulation of pleural fluid though clinically he does not appear to have a significant effusion.  Other etiologies include lymphangitic tumor spread, pulmonary embolus.  We have contacted the hospitalist service for admission/further evaluation.  Hospitalist recommendation is for initial evaluation in the emergency department.  End of life issues/CODE STATUS discussed.  He would like to remain a FULL CODE.  Patient seen with Dr. Benay Spice.  25 minutes were spent face-to-face at today's visit with the majority of that time involved in counseling/coordination of care.    Ned Card ANP/GNP-BC   04/28/2017  4:04 PM  This was insured visit with Ned Card.  Grant Todd was  interviewed and examined.  He presents today with progressive dyspnea.  This is despite undergoing a thoracentesis earlier this week.  He received a first treatment with second line systemic therapy, pembrolizumab, on 04/26/2017.  He appears uncomfortable.  He agrees to hospital admission.  We discussed the differential diagnosis including pulmonary embolism, progression of pleural effusions, and lymphangitic tumor spread.  The heart rate is rapid and irregular.  He may have a secondary arrhythmia.  I contacted the hospitalist service.  He will be transferred to the emergency room for further evaluation while awaiting hospital admission.  I recommend obtaining a CT chest to rule out pulmonary embolism and effusions.  We discussed CPR and ACLS issues.  He would like to remain on a full CODE STATUS for now.  I appreciate the care from the hospitalist service.  Please call Oncology as needed.  I will check on him on 05/01/2017.  Julieanne Manson, MD

## 2017-04-28 NOTE — ED Notes (Signed)
Patient was taken to assigned room 2 when he finished the bathroom. HR-147 and sats dropped to 805h O2 on. Patient then transferred to Res A.

## 2017-04-28 NOTE — ED Triage Notes (Addendum)
Patient was at the Cancer today for c/o SOB and patient was brought to the ED this afternoon for further treatment. Patient has had a pleural effusion, chemo treatment this week. Patient has started using O2 3 days ago and is currently using O2 3L/min via [place].

## 2017-04-28 NOTE — Progress Notes (Signed)
A consult was received from an ED physician for vancomycin and cefepime per pharmacy dosing.  The patient's profile has been reviewed for ht/wt/allergies/indication/available labs.   A one time order has been placed for vancomycin 1500 mg and cefepime 2 gm IV x 1 dose.    Further antibiotics/pharmacy consults should be ordered by admitting physician if indicated.                       Thank you, Eudelia Bunch, Pharm.D. [phone] 04/28/2017 6:59 PM

## 2017-04-28 NOTE — ED Notes (Signed)
ED TO INPATIENT HANDOFF REPORT  Name/Age/Gender Grant Todd 62 y.o. male  Code Status    Code Status Orders  (From admission, onward)        Start     Ordered   04/28/17 2136  Do not attempt resuscitation (DNR)  Continuous    Question Answer Comment  In the event of cardiac or respiratory ARREST Do not call a "code blue"   In the event of cardiac or respiratory ARREST Do not perform Intubation, CPR, defibrillation or ACLS   In the event of cardiac or respiratory ARREST Use medication by any route, position, wound care, and other measures to relive pain and suffering. May use oxygen, suction and manual treatment of airway obstruction as needed for comfort.      04/28/17 2135    Code Status History    Date Active Date Inactive Code Status Order ID Comments User Context   04/28/2017 2127 04/28/2017 2135 DNR [id number]  Kandice Hams, MD ED      Home/SNF/Other Home  Chief Complaint sob  Level of Care/Admitting Diagnosis ED Disposition    ED Disposition Condition [employer]: [facility] [[id number]]  Level of Care: ICU [6]  Diagnosis: Sepsis [facility]) [[id number]]  Admitting Physician: Kandice Hams [[id number]]  Attending Physician: Kandice Hams [[id number]]  Estimated length of stay: 3 - 4 days  Certification:: I certify this patient will need inpatient services for at least 2 midnights  PT Class (Do Not Modify): Inpatient [101]  PT Acc Code (Do Not Modify): Private [1]       Medical History Past Medical History:  Diagnosis Date  . Elevated LFTs   . Esophageal mass   . Pleural effusion on left   . Posterior tibial tendon dysfunction, right   . Rheumatoid arthritis ([place])   . Seasonal allergic rhinitis   . Severe esophageal dysplasia   . Tobacco use     Allergies Allergies  Allergen Reactions  . Chantix [Varenicline] Other (See Comments)    Vivid dreams and lethargic  . Methotrexate Derivatives     Caused  pleural effusions  . Shellfish Allergy Hives  . Sulfonamide Derivatives Rash    IV Location/Drains/Wounds Patient Lines/Drains/Airways Status   Active Line/Drains/Airways    Name:   Placement date:   Placement time:   Site:   Days:   Implanted Port 01/04/17 Right Chest   01/04/17    -    Chest   114   Peripheral IV 04/28/17 Left Hand   04/28/17    2118    Hand   less than 1          Labs/Imaging Results for orders placed or performed during the hospital encounter of 04/28/17 (from the past 48 hour(s))  CBC     Status: Abnormal   Collection Time: 04/28/17  6:08 PM  Result Value Ref Range   WBC 22.1 (H) 4.0 - 10.5 K/uL   RBC 3.89 (L) 4.22 - 5.81 MIL/uL   Hemoglobin 11.3 (L) 13.0 - 17.0 g/dL   HCT 35.4 (L) 39.0 - 52.0 %   MCV 91.0 78.0 - 100.0 fL   MCH 29.0 26.0 - 34.0 pg   MCHC 31.9 30.0 - 36.0 g/dL   RDW 18.9 (H) 11.5 - 15.5 %   Platelets 310 150 - 400 K/uL    Comment: Performed at [facility], [place] [address] [place], [place] [address]  Brain natriuretic peptide  Status: Abnormal   Collection Time: 04/28/17  6:08 PM  Result Value Ref Range   B Natriuretic Peptide 193.8 (H) 0.0 - 100.0 pg/mL    Comment: Performed at [facility], [place] [address] [place], [place] [address]  Basic metabolic panel     Status: Abnormal   Collection Time: 04/28/17  6:08 PM  Result Value Ref Range   Sodium 139 135 - 145 mmol/L   Potassium 3.9 3.5 - 5.1 mmol/L   Chloride 104 101 - 111 mmol/L   CO2 24 22 - 32 mmol/L   Glucose, Bld 118 (H) 65 - 99 mg/dL   BUN 25 (H) 6 - 20 mg/dL   Creatinine, Ser 0.71 0.61 - 1.24 mg/dL   Calcium 9.1 8.9 - 10.3 mg/dL   GFR calc non Af Amer >60 >60 mL/min   GFR calc Af Amer >60 >60 mL/min    Comment: (NOTE) The eGFR has been calculated using the CKD EPI equation. This calculation has not been validated in all clinical situations. eGFR's persistently <60 mL/min signify possible Chronic Kidney Disease.    Anion gap 11  5 - 15    Comment: Performed at [facility], [place] [address] [place], [place] [address]  I-stat troponin, ED  (not at [facility], [facility])     Status: None   Collection Time: 04/28/17  6:12 PM  Result Value Ref Range   Troponin i, poc 0.02 0.00 - 0.08 ng/mL   Comment 3            Comment: Due to the release kinetics of cTnI, a negative result within the first hours of the onset of symptoms does not rule out myocardial infarction with certainty. If myocardial infarction is still suspected, repeat the test at appropriate intervals.   I-Stat CG4 Lactic Acid, ED     Status: Abnormal   Collection Time: 04/28/17  9:23 PM  Result Value Ref Range   Lactic Acid, Venous 1.95 (H) 0.5 - 1.9 mmol/L   Ct Angio Chest Pe W And/or Wo Contrast  Result Date: 04/28/2017 CLINICAL DATA:  Esophageal cancer. Ongoing chemotherapy. Patient complains of shortness breath. EXAM: CT ANGIOGRAPHY CHEST WITH CONTRAST TECHNIQUE: Multidetector CT imaging of the chest was performed using the standard protocol during bolus administration of intravenous contrast. Multiplanar CT image reconstructions and MIPs were obtained to evaluate the vascular anatomy. CONTRAST:  18m ISOVUE-370 IOPAMIDOL (ISOVUE-370) INJECTION 76% COMPARISON:  04/21/2017 FINDINGS: Cardiovascular: Satisfactory opacification of the pulmonary arteries to the segmental level. No evidence of pulmonary embolism. Mildly enlarged heart. Tiny pericardial effusion. Mild coronary artery disease. Central venous catheter terminates in the distal superior vena cava. Mediastinum/Nodes: Enlarged bilateral mediastinal lymph nodes are not significantly changed with the largest node in right pretracheal location measuring 1.9 cm. Again seen is circumferential thickening of the distal esophagus just above the GE junction. The thyroid is grossly unremarkable. Lungs/Pleura: Right upper lobe pulmonary mass appears more fragmented and measures approximately 4.4 x 5.1 cm.  There is subpleural soft tissue thickening extending inferiorly along the anterior pleura of the right upper lobe and right middle lobe, likely representing extension of disease. There is persistent interstitial thickening in the right upper lobe and right middle lobe suggestive of lymphangitic spread of disease. Interval development of interstitial thickening in the left lung, with upper lobe predominance. Stable moderate in size left pleural effusion. The right pleural effusion demonstrates loculations. Upper Abdomen: Interval development of water density pleural effusion, moderate. Musculoskeletal: No chest wall abnormality.  No acute or significant osseous findings. Review of the MIP images confirms the above findings. IMPRESSION: No evidence of pulmonary embolus. Previously demonstrated right upper lobe malignancy appears more fragmented and measures slightly smaller at 5.1 cm in greatest dimension. Progression of lymphangitic spread of disease in the right upper and right middle lobes. Interval development of interstitial thickening throughout the left lung with upper lobe predominance. This may represent contralateral lymphangitic spread of malignancy versus development of pulmonary edema. Interval development of loculations within the known right pleural effusion. Stable moderate in size left pleural effusion. Interval development of abdominal ascites. Stable thickening of the distal esophagus. Electronically Signed   By: Fidela Salisbury M.D.   On: 04/28/2017 19:59   Dg Chest Port 1 View  Result Date: 04/28/2017 CLINICAL DATA:  Shortness of breath EXAM: PORTABLE CHEST 1 VIEW COMPARISON:  04/24/2017, CT chest 04/21/2017 FINDINGS: Right-sided central venous port tip over the SVC. Small left greater than right pleural effusion, similar on the left slight increase on the right. Progressive consolidation in the right upper lung. Stable cardiomediastinal silhouette. Vascular congestion. Diffuse interstitial  opacity as before. IMPRESSION: 1. Similar appearance of left pleural effusion. Slight increased small right pleural effusion 2. Worsening consolidation in the right mid to upper lung which may reflect combination of mass and adjacent pneumonia or inflammatory process 3. Similar appearance of diffuse interstitial disease as compared with recent priors. Electronically Signed   By: Donavan Foil M.D.   On: 04/28/2017 18:28    Pending Labs Unresulted Labs (From admission, onward)   Start     Ordered   04/29/17 0500  Procalcitonin  Daily,   R     04/28/17 2132   04/29/17 8177  Basic metabolic panel  Tomorrow morning,   R     04/28/17 2133   04/29/17 0500  CBC  Tomorrow morning,   R     04/28/17 2133   04/29/17 0500  Magnesium  Tomorrow morning,   R     04/28/17 2133   04/29/17 0500  Phosphorus  Tomorrow morning,   R     04/28/17 2133   04/28/17 2242  Respiratory Panel by PCR  (Respiratory virus panel)  Once,   R     04/28/17 2241   04/28/17 2138  Culture, expectorated sputum-assessment  Once,   R     04/28/17 2137   04/28/17 2137  Magnesium  STAT,   R     04/28/17 2136   04/28/17 2137  Phosphorus  STAT,   R     04/28/17 2136   04/28/17 2134  Urinalysis, Routine w reflex microscopic  Once,   R     04/28/17 2133   04/28/17 2133  Procalcitonin - Baseline  STAT,   STAT     04/28/17 2132   04/28/17 2133  Lactic acid, plasma  STAT Now then every 3 hours,   R     04/28/17 2132   04/28/17 1848  Blood culture (routine x 2)  BLOOD CULTURE X 2,   STAT     04/28/17 1848      Vitals/Pain Today's Vitals   04/28/17 1654 04/28/17 2018 04/28/17 2047 04/28/17 2219  BP:    110/78  Pulse:  (!) 166  64  Resp:  (!) 31  (!) 34  Temp:      TempSrc:      SpO2:  100% 100% 100%  Weight: 163 lb (73.9 kg)   163 lb (73.9 kg)  Height:  $'5\' 8"'h$  (1.727 m)   '5\' 8"'$  (1.727 m)  PainSc:        Isolation Precautions Droplet precaution  Medications Medications  vancomycin (VANCOCIN) 1,500 mg in sodium chloride  0.9 % 500 mL IVPB (1,500 mg Intravenous New Bag/Given 04/28/17 2116)  ceFEPIme (MAXIPIME) 2 g in sodium chloride 0.9 % 100 mL IVPB (2 g Intravenous New Bag/Given 04/28/17 2245)  iopamidol (ISOVUE-370) 76 % injection (has no administration in time range)  diltiazem (CARDIZEM) 1 mg/mL load via infusion 15 mg (15 mg Intravenous Bolus from Bag 04/28/17 1948)    And  diltiazem (CARDIZEM) 100 mg in dextrose 5% 131m (1 mg/mL) infusion (10 mg/hr Intravenous Rate/Dose Change 04/28/17 2004)  sodium chloride 0.9 % bolus 1,000 mL (1,000 mLs Intravenous New Bag/Given 04/28/17 1948)    And  sodium chloride 0.9 % bolus 1,000 mL (has no administration in time range)    And  sodium chloride 0.9 % bolus 250 mL (has no administration in time range)  pantoprazole (PROTONIX) injection 40 mg (has no administration in time range)  0.9 %  sodium chloride infusion (has no administration in time range)  heparin injection 5,000 Units (has no administration in time range)  ipratropium-albuterol (DUONEB) 0.5-2.5 (3) MG/3ML nebulizer solution 3 mL (3 mLs Nebulization Given 04/28/17 1843)  predniSONE (DELTASONE) tablet 60 mg (60 mg Oral Given 04/28/17 1859)  sodium chloride 0.9 % bolus 500 mL (0 mLs Intravenous Stopped 04/28/17 2236)  albuterol (PROVENTIL) (2.5 MG/3ML) 0.083% nebulizer solution 5 mg (5 mg Nebulization Given 04/28/17 2047)  iopamidol (ISOVUE-370) 76 % injection 100 mL (100 mLs Intravenous Contrast Given 04/28/17 1917)    Mobility walks

## 2017-04-28 NOTE — ED Notes (Signed)
Patient transported to CT 

## 2017-04-28 NOTE — Telephone Encounter (Signed)
"  My husband's lungs were drained Monday.  Relief for about twelve hours.  Progressed over the week to him not being able to get enough air in so I boughtt a monitor.  Oxygen level in low eighties.  Increased oxygen to 3L.  Continues in low eighties.  Lying down at rest was highest = 89 %.  He's pale, weak, can't catch his breath, labored breathing, wheezing, coughing up all sorts of different colors.  Sometimes tinged pink at times.  What can we do besides the ED?"  Provider notified of call information.  Collaborative nurse will return patient's call.

## 2017-04-28 NOTE — ED Notes (Signed)
Bed: WA02 Expected date:  Expected time:  Means of arrival:  Comments: Hold for 25

## 2017-04-28 NOTE — Telephone Encounter (Signed)
Pt offered appt here with [place] due to symptoms. Pt on the way

## 2017-04-28 NOTE — ED Provider Notes (Signed)
[facility] DEPT Provider Note   CSN: [id number] Arrival date & time: 04/28/17  1644     History   Chief Complaint Chief Complaint  Patient presents with  . Shortness of Breath    HPI Grant Todd is a 62 y.o. male.  HPI Pt has history of metastatic esophageal ca.  He has been getting short of breath over the last several weeks.  He had a thoracentesis on Monday but that has not helped much. The test did show malignant pleural effusion. He still gets short of breath with movement and walking.   No fevers.  No leg swelling.  Some pain on the right side of the chest.    He does not have a history of a copd but use to smoke.   Past Medical History:  Diagnosis Date  . Elevated LFTs   . Esophageal mass   . Pleural effusion on left   . Posterior tibial tendon dysfunction, right   . Rheumatoid arthritis ([place])   . Seasonal allergic rhinitis   . Severe esophageal dysplasia   . Tobacco use     Patient Active Problem List   Diagnosis Date Noted  . Port-A-Cath in place 01/18/2017  . Cancer of distal third of esophagus ([place]) 12/23/2016  . Goals of care, counseling/discussion 12/23/2016  . Rheumatoid arthritis ([place])   . Severe esophageal dysplasia   . Esophageal mass   . PLEURAL EFFUSION 12/12/2006  . ARTHRITIS, RHEUMATOID 12/12/2006    Past Surgical History:  Procedure Laterality Date  . CHOLECYSTECTOMY    . IR FLUORO GUIDE PORT INSERTION RIGHT  01/04/2017  . IR US GUIDE BX ASP/DRAIN  01/04/2017  . IR US GUIDE VASC ACCESS RIGHT  01/04/2017  . TONSILLECTOMY          Home Medications    Prior to Admission medications   Medication Sig Start Date End Date Taking? Authorizing Provider  amoxicillin-clavulanate (AUGMENTIN) 875-125 MG tablet Take 1 tablet by mouth 2 (two) times daily. 04/21/17  Yes Owens Shark, NP  HYDROcodone-acetaminophen (NORCO/VICODIN) 5-325 MG tablet Take 1 tablet by mouth every 4 (four) hours as needed for moderate pain or  severe pain.    Yes [provider]  morphine (MS CONTIN) 15 MG 12 hr tablet Take 1 tablet (15 mg total) by mouth every 12 (twelve) hours. Patient taking differently: Take 15 mg by mouth at bedtime.  04/26/17  Yes Ladell Pier, MD  pantoprazole (PROTONIX) 40 MG tablet TAKE ONE (1) TABLET BY MOUTH EVERY DAY 04/12/17  Yes Owens Shark, NP  predniSONE (DELTASONE) 5 MG tablet Take 1 tablet by mouth daily. 11/21/16  Yes [provider]  pseudoephedrine-guaifenesin (MUCINEX D) 60-600 MG 12 hr tablet Take 1 tablet by mouth every 12 (twelve) hours.   Yes [provider]  lidocaine-prilocaine (EMLA) cream Apply 1 application topically as needed. Apply a small amount of emla cream on a cotton ball and place cream over port site 1-2 hours prior to treatmetn. Do not rub in . Cover with plastic wrap 01/18/17   Ladell Pier, MD    Family History Family History  Problem Relation Age of Onset  . Uterine cancer Mother   . Hyperlipidemia Father   . CVA Father   . Head & neck cancer Father   . Testicular cancer Son     Social History Social History   Tobacco Use  . Smoking status: Former Smoker    Types: Cigarettes  Last attempt to quit: 03/03/2001    Years since quitting: 16.1  . Smokeless tobacco: Never Used  Substance Use Topics  . Alcohol use: Yes    Comment: social  . Drug use: No     Allergies   Chantix [varenicline]; Methotrexate derivatives; Shellfish allergy; and Sulfonamide derivatives   Review of Systems Review of Systems  All other systems reviewed and are negative.    Physical Exam Updated Vital Signs BP 98/79 (BP Location: Right Arm)   Pulse (!) 166   Temp 98.1 F (36.7 C) (Oral)   Resp (!) 31   Ht 1.727 m (5\' 8" )   Wt 73.9 kg (163 lb)   SpO2 100%   BMI 24.78 kg/m   Physical Exam  Constitutional:  Non-toxic appearance.  HENT:  Head: Normocephalic and atraumatic.  Right Ear: External ear normal.  Left Ear: External ear  normal.  Eyes: Conjunctivae are normal. Right eye exhibits no discharge. Left eye exhibits no discharge. No scleral icterus.  Neck: Neck supple. No tracheal deviation present.  Cardiovascular: Normal rate, regular rhythm and intact distal pulses.  Pulmonary/Chest: Effort normal. No stridor. No respiratory distress. He has wheezes. He has no rales.  Abdominal: Soft. Bowel sounds are normal. He exhibits no distension. There is no tenderness. There is no rebound and no guarding.  Musculoskeletal: He exhibits no edema or tenderness.  Neurological: He is alert. He has normal strength. No cranial nerve deficit (no facial droop, extraocular movements intact, no slurred speech) or sensory deficit. He exhibits normal muscle tone. He displays no seizure activity. Coordination normal.  Skin: Skin is warm and dry. No rash noted.  Psychiatric: He has a normal mood and affect.  Nursing note and vitals reviewed.    ED Treatments / Results  Labs (all labs ordered are listed, but only abnormal results are displayed) Labs Reviewed  CBC - Abnormal; Notable for the following components:      Result Value   WBC 22.1 (*)    RBC 3.89 (*)    Hemoglobin 11.3 (*)    HCT 35.4 (*)    RDW 18.9 (*)    All other components within normal limits  BRAIN NATRIURETIC PEPTIDE - Abnormal; Notable for the following components:   B Natriuretic Peptide 193.8 (*)    All other components within normal limits  BASIC METABOLIC PANEL - Abnormal; Notable for the following components:   Glucose, Bld 118 (*)    BUN 25 (*)    All other components within normal limits  CULTURE, BLOOD (ROUTINE X 2)  CULTURE, BLOOD (ROUTINE X 2)  I-STAT TROPONIN, ED  I-STAT CG4 LACTIC ACID, ED  I-STAT CG4 LACTIC ACID, ED    EKG EKG Interpretation  Date/Time:  Friday April 28 2017 17:32:21 EDT Ventricular Rate:  128 PR Interval:    QRS Duration: 87 QT Interval:  306 QTC Calculation: 447 R Axis:   60 Text Interpretation:  Sinus  tachycardia Paired ventricular premature complexes Aberrant complex Abnormal R-wave progression, early transition Borderline T wave abnormalities Baseline wander in lead(s) II III aVF Since last tracing rate faster Confirmed by Dorie Rank [phone]) on 04/28/2017 5:39:57 PM   Radiology Ct Angio Chest Pe W And/or Wo Contrast  Result Date: 04/28/2017 CLINICAL DATA:  Esophageal cancer. Ongoing chemotherapy. Patient complains of shortness breath. EXAM: CT ANGIOGRAPHY CHEST WITH CONTRAST TECHNIQUE: Multidetector CT imaging of the chest was performed using the standard protocol during bolus administration of intravenous contrast. Multiplanar CT image reconstructions and MIPs were  obtained to evaluate the vascular anatomy. CONTRAST:  135mL ISOVUE-370 IOPAMIDOL (ISOVUE-370) INJECTION 76% COMPARISON:  04/21/2017 FINDINGS: Cardiovascular: Satisfactory opacification of the pulmonary arteries to the segmental level. No evidence of pulmonary embolism. Mildly enlarged heart. Tiny pericardial effusion. Mild coronary artery disease. Central venous catheter terminates in the distal superior vena cava. Mediastinum/Nodes: Enlarged bilateral mediastinal lymph nodes are not significantly changed with the largest node in right pretracheal location measuring 1.9 cm. Again seen is circumferential thickening of the distal esophagus just above the GE junction. The thyroid is grossly unremarkable. Lungs/Pleura: Right upper lobe pulmonary mass appears more fragmented and measures approximately 4.4 x 5.1 cm. There is subpleural soft tissue thickening extending inferiorly along the anterior pleura of the right upper lobe and right middle lobe, likely representing extension of disease. There is persistent interstitial thickening in the right upper lobe and right middle lobe suggestive of lymphangitic spread of disease. Interval development of interstitial thickening in the left lung, with upper lobe predominance. Stable moderate in size left  pleural effusion. The right pleural effusion demonstrates loculations. Upper Abdomen: Interval development of water density pleural effusion, moderate. Musculoskeletal: No chest wall abnormality. No acute or significant osseous findings. Review of the MIP images confirms the above findings. IMPRESSION: No evidence of pulmonary embolus. Previously demonstrated right upper lobe malignancy appears more fragmented and measures slightly smaller at 5.1 cm in greatest dimension. Progression of lymphangitic spread of disease in the right upper and right middle lobes. Interval development of interstitial thickening throughout the left lung with upper lobe predominance. This may represent contralateral lymphangitic spread of malignancy versus development of pulmonary edema. Interval development of loculations within the known right pleural effusion. Stable moderate in size left pleural effusion. Interval development of abdominal ascites. Stable thickening of the distal esophagus. Electronically Signed   By: Fidela Salisbury M.D.   On: 04/28/2017 19:59   Dg Chest Port 1 View  Result Date: 04/28/2017 CLINICAL DATA:  Shortness of breath EXAM: PORTABLE CHEST 1 VIEW COMPARISON:  04/24/2017, CT chest 04/21/2017 FINDINGS: Right-sided central venous port tip over the SVC. Small left greater than right pleural effusion, similar on the left slight increase on the right. Progressive consolidation in the right upper lung. Stable cardiomediastinal silhouette. Vascular congestion. Diffuse interstitial opacity as before. IMPRESSION: 1. Similar appearance of left pleural effusion. Slight increased small right pleural effusion 2. Worsening consolidation in the right mid to upper lung which may reflect combination of mass and adjacent pneumonia or inflammatory process 3. Similar appearance of diffuse interstitial disease as compared with recent priors. Electronically Signed   By: Donavan Foil M.D.   On: 04/28/2017 18:28     Procedures .Critical Care Performed by: Dorie Rank, MD Authorized by: Dorie Rank, MD   Critical care provider statement:    Critical care time (minutes):  45   Critical care was time spent personally by me on the following activities:  Discussions with consultants, evaluation of patient's response to treatment, examination of patient, ordering and performing treatments and interventions, ordering and review of laboratory studies, ordering and review of radiographic studies, pulse oximetry, re-evaluation of patient's condition, obtaining history from patient or surrogate and review of old charts   (including critical care time)  Medications Ordered in ED Medications  vancomycin (VANCOCIN) 1,500 mg in sodium chloride 0.9 % 500 mL IVPB (has no administration in time range)  ceFEPIme (MAXIPIME) 2 g in sodium chloride 0.9 % 100 mL IVPB (has no administration in time range)  iopamidol (ISOVUE-370) 76 %  injection (has no administration in time range)  albuterol (PROVENTIL) (2.5 MG/3ML) 0.083% nebulizer solution 5 mg (has no administration in time range)  diltiazem (CARDIZEM) 1 mg/mL load via infusion 15 mg (15 mg Intravenous Bolus from Bag 04/28/17 1948)    And  diltiazem (CARDIZEM) 100 mg in dextrose 5% 133mL (1 mg/mL) infusion (10 mg/hr Intravenous Rate/Dose Change 04/28/17 2004)  sodium chloride 0.9 % bolus 1,000 mL (1,000 mLs Intravenous New Bag/Given 04/28/17 1948)    And  sodium chloride 0.9 % bolus 1,000 mL (has no administration in time range)    And  sodium chloride 0.9 % bolus 250 mL (has no administration in time range)  ipratropium-albuterol (DUONEB) 0.5-2.5 (3) MG/3ML nebulizer solution 3 mL (3 mLs Nebulization Given 04/28/17 1843)  predniSONE (DELTASONE) tablet 60 mg (60 mg Oral Given 04/28/17 1859)  sodium chloride 0.9 % bolus 500 mL (500 mLs Intravenous New Bag/Given 04/28/17 1849)  iopamidol (ISOVUE-370) 76 % injection 100 mL (100 mLs Intravenous Contrast Given 04/28/17 1917)      Initial Impression / Assessment and Plan / ED Course  I have reviewed the triage vital signs and the nursing notes.  Pertinent labs & imaging results that were available during my care of the patient were reviewed by me and considered in my medical decision making (see chart for details).  Clinical Course as of Apr 28 2041  Fri Apr 28, 2017  1846 Patient's laboratory tests are notable for an elevated white blood cell count increased from 2 days ago.   [ST]  4196 Chest x-ray does show increasing changes in the right lung.  Possibly related to mass versus infection.   [QI]  2979 I will start the patient on antibiotics considering the possibility of pneumonia and infection.  He is at high risk for PE.  I will order CT angiogram first to rule out any pulmonary embolism or infarction.   [JK]  1915 Felt somewhat better after breathing treatment.   Updated pt and family on status   [JK]  1931 Checked on pt at the bedside.  HR up to the 160s now.   Work of breathing increased.  Will start on bipap and cardizem drip.  Ordered lactic acid level.  Will give fluid infusion for possible sepsis   [JK]  2015 Still tachycardic.  Infusion is being titrated.  Work of breathing improving with bipap   [JK]  2042 Discussed with PCCM.  Will consult for admission.   [JK]    Clinical Course User Index [JK] Dorie Rank, MD    Patient presented to the emergency room for evaluation of worsening shortness of breath.  Patient has a history of metastatic esophageal cancer.  Patient's evaluation in the ED was concerning for the possibility of sepsis, pneumonia as well as worsening malignancy in the lung.  While he was in the emergency room the patient had increasing work of breathing associated with tachycardia.  He was started on a Cardizem drip.  Patient's EKG was more consistent with multifocal atrial tachycardia rather than atrial fibrillation.  Patient was also started on BiPAP for his increased work of  breathing.  On repeat exam the patient is feeling better although he still is tachycardic.  The Cardizem drip is being titrated.  I spoke with pulmonary critical care.  Plan on admission to the ICU for further treatment we will continue to monitor closely.  Final Clinical Impressions(s) / ED Diagnoses   Final diagnoses:  COPD exacerbation ([place])  Metastatic cancer ([place])  HCAP (healthcare-associated pneumonia)  Atrial tachycardia ([place])  Sepsis, due to unspecified organism [facility])      Dorie Rank, MD 04/28/17 2045

## 2017-04-28 NOTE — ED Notes (Signed)
EDP notified of patient's status.

## 2017-04-28 NOTE — ED Notes (Signed)
Patient insisted on going to the bathroom prior to receiving his breathing treatment and Prednisone. Patient was assisted to the bathroom on O2 4L/min via [place] by Herbie Baltimore, EMT.

## 2017-04-29 ENCOUNTER — Encounter (HOSPITAL_COMMUNITY): Payer: Self-pay | Admitting: Physician Assistant

## 2017-04-29 DIAGNOSIS — Z515 Encounter for palliative care: Secondary | ICD-10-CM

## 2017-04-29 DIAGNOSIS — J9601 Acute respiratory failure with hypoxia: Secondary | ICD-10-CM

## 2017-04-29 LAB — BASIC METABOLIC PANEL
ANION GAP: 9 (ref 5–15)
BUN: 20 mg/dL (ref 6–20)
CHLORIDE: 112 mmol/L — AB (ref 101–111)
CO2: 24 mmol/L (ref 22–32)
CREATININE: 0.78 mg/dL (ref 0.61–1.24)
Calcium: 8.9 mg/dL (ref 8.9–10.3)
GFR calc non Af Amer: >60 mL/min (ref >60)
Glucose, Bld: 137 mg/dL — ABNORMAL HIGH (ref 65–99)
POTASSIUM: 4.1 mmol/L (ref 3.5–5.1)
SODIUM: 145 mmol/L (ref 135–145)

## 2017-04-29 LAB — CBC
HCT: 31 % — ABNORMAL LOW (ref 39.0–52.0)
HEMOGLOBIN: 9.9 g/dL — AB (ref 13.0–17.0)
MCH: 29 pg (ref 26.0–34.0)
MCHC: 31.9 g/dL (ref 30.0–36.0)
MCV: 90.9 fL (ref 78.0–100.0)
PLATELETS: 288 10*3/uL (ref 150–400)
RBC: 3.41 MIL/uL — AB (ref 4.22–5.81)
RDW: 18.9 % — ABNORMAL HIGH (ref 11.5–15.5)
WBC: 17.5 10*3/uL — AB (ref 4.0–10.5)

## 2017-04-29 LAB — PROCALCITONIN
Procalcitonin: 0.88 ng/mL
Procalcitonin: 0.86 ng/mL

## 2017-04-29 LAB — RESPIRATORY PANEL BY PCR
ADENOVIRUS-RVPPCR: NOT DETECTED
Bordetella pertussis: NOT DETECTED
CORONAVIRUS 229E-RVPPCR: NOT DETECTED
CORONAVIRUS OC43-RVPPCR: NOT DETECTED
Chlamydophila pneumoniae: NOT DETECTED
Coronavirus HKU1: NOT DETECTED
Coronavirus NL63: NOT DETECTED
INFLUENZA A-RVPPCR: NOT DETECTED
Influenza B: NOT DETECTED
METAPNEUMOVIRUS-RVPPCR: NOT DETECTED
MYCOPLASMA PNEUMONIAE-RVPPCR: NOT DETECTED
PARAINFLUENZA VIRUS 1-RVPPCR: NOT DETECTED
PARAINFLUENZA VIRUS 2-RVPPCR: NOT DETECTED
PARAINFLUENZA VIRUS 4-RVPPCR: NOT DETECTED
Parainfluenza Virus 3: NOT DETECTED
RESPIRATORY SYNCYTIAL VIRUS-RVPPCR: NOT DETECTED
Rhinovirus / Enterovirus: NOT DETECTED

## 2017-04-29 LAB — PHOSPHORUS: Phosphorus: 3.7 mg/dL (ref 2.5–4.6)

## 2017-04-29 LAB — MAGNESIUM: MAGNESIUM: 1.7 mg/dL (ref 1.7–2.4)

## 2017-04-29 LAB — MRSA PCR SCREENING: MRSA by PCR: NEGATIVE

## 2017-04-29 LAB — LACTIC ACID, PLASMA: Lactic Acid, Venous: 2 mmol/L (ref 0.5–1.9)

## 2017-04-29 MED ORDER — VANCOMYCIN HCL IN DEXTROSE 1-5 GM/200ML-% IV SOLN
1000.0000 mg | Freq: Two times a day (BID) | INTRAVENOUS | Status: AC
  Administered 2017-04-29 – 2017-05-04 (×12): 1000 mg via INTRAVENOUS
  Filled 2017-04-29 (×12): qty 200

## 2017-04-29 MED ORDER — SODIUM CHLORIDE 0.9 % IV SOLN
2.0000 g | Freq: Three times a day (TID) | INTRAVENOUS | Status: DC
  Administered 2017-04-29 – 2017-05-02 (×10): 2 g via INTRAVENOUS
  Filled 2017-04-29 (×11): qty 2

## 2017-04-29 MED ORDER — CHLORHEXIDINE GLUCONATE CLOTH 2 % EX PADS
6.0000 | MEDICATED_PAD | Freq: Every day | CUTANEOUS | Status: DC
  Administered 2017-04-29 – 2017-05-05 (×7): 6 via TOPICAL

## 2017-04-29 MED ORDER — METHYLPREDNISOLONE SODIUM SUCC 125 MG IJ SOLR
60.0000 mg | Freq: Four times a day (QID) | INTRAMUSCULAR | Status: DC
  Administered 2017-04-29 – 2017-05-03 (×16): 60 mg via INTRAVENOUS
  Filled 2017-04-29 (×17): qty 2

## 2017-04-29 MED ORDER — VANCOMYCIN HCL 10 G IV SOLR: 1500.0000 mg | INTRAVENOUS | Status: DC

## 2017-04-29 MED ORDER — HEPARIN SODIUM (PORCINE) 5000 UNIT/ML IJ SOLN
5000.0000 [IU] | Freq: Three times a day (TID) | INTRAMUSCULAR | Status: DC
  Administered 2017-04-29 – 2017-05-05 (×19): 5000 [IU] via SUBCUTANEOUS
  Filled 2017-04-29 (×20): qty 1

## 2017-04-29 MED ORDER — ZOLPIDEM TARTRATE 5 MG PO TABS
5.0000 mg | ORAL_TABLET | Freq: Once | ORAL | Status: AC
  Administered 2017-04-29: 5 mg via ORAL
  Filled 2017-04-29: qty 1

## 2017-04-29 MED ORDER — SODIUM CHLORIDE 0.9% FLUSH: 10.0000 mL | INTRAVENOUS | Status: DC | PRN

## 2017-04-29 MED ORDER — SODIUM CHLORIDE 0.9% FLUSH
10.0000 mL | Freq: Two times a day (BID) | INTRAVENOUS | Status: DC
  Administered 2017-04-29: 20 mL
  Administered 2017-04-29 – 2017-05-05 (×7): 10 mL

## 2017-04-29 NOTE — Progress Notes (Signed)
Pharmacy Antibiotic Note  Grant Todd is a 62 y.o. male admitted on 04/28/2017 with pneumonia.  Pharmacy has been consulted for Vancomycin, cefepime dosing.  Plan: Cefepime 2gm iv q8hr  Vancomycin 1500mg  iv q24hr  Goal AUC = 400 - 500 for all indications, except meningitis (goal AUC > 500 and Cmin 15-20 mcg/mL)   Height: 5\' 8"  (172.7 cm) Weight: 165 lb 9.1 oz (75.1 kg) IBW/kg (Calculated) : 68.4  Temp (24hrs), Avg:98.2 F (36.8 C), Min:98.1 F (36.7 C), Max:98.3 F (36.8 C)  Recent Labs  Lab 04/26/17 0757 04/28/17 1808 04/28/17 2123 04/28/17 2228 04/28/17 2252  WBC 18.1* 22.1*  --   --   --   CREATININE 0.81 0.71  --   --   --   LATICACIDVEN  --   --  1.95* 2.0* 1.96*    Estimated Creatinine Clearance: 90.3 mL/min (by C-G formula based on SCr of 0.71 mg/dL).    Allergies  Allergen Reactions  . Chantix [Varenicline] Other (See Comments)    Vivid dreams and lethargic  . Methotrexate Derivatives     Caused pleural effusions  . Shellfish Allergy Hives  . Sulfonamide Derivatives Rash    Antimicrobials this admission: Vancomycin 04/28/2017 >> Cefepime 04/28/2017 >>   Dose adjustments this admission: -  Microbiology results: -  Thank you for allowing pharmacy to be a part of this patient's care.  Nani Skillern Crowford 04/29/2017 3:44 AM

## 2017-04-29 NOTE — Progress Notes (Signed)
CRITICAL VALUE ALERT  Critical Value: Lactic Acid 2.0  Date & Time Notied:04/29/2017  Provider Notified

## 2017-04-29 NOTE — Progress Notes (Signed)
Pt placed on 15 LPM [place], pt tolerating well at this time.  Pt wishes to remain off BIPAP if possible, RT to monitor and assess as needed.

## 2017-04-29 NOTE — H&P (Signed)
Chief Complaint: Malignant pleural effusion  Referring Physician(s): Omar Person  Supervising Physician: Daryll Brod  Patient Status: [facility] - In-pt  History of Present Illness: Grant Todd is a 62 y.o. 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   He was seen by his pulmonologist of Friday and his O2 sats were in the 80s. He also had tachycardia so he referred to the ED.    Pt has been getting short of breath esp on exertion over the last several weeks and although he received a thoracentesis on Monday it did not help much.  He is currently on BiPAP and states that his breathing has improved some.  His WBC is currently elevated at 17.5 (down from 22.1) possible secondary to ost Obstructive Pneumonia? Or from spontaneous bacterial peritonitis.  He is currently being covered with broad spectrum abx Vancomycin and Cefepime   Past Medical History:  Diagnosis Date  . Elevated LFTs   . Esophageal mass   . Pleural effusion on left   . Posterior tibial tendon dysfunction, right   . Rheumatoid arthritis ([place])   . Seasonal allergic rhinitis   . Severe esophageal dysplasia   . Tobacco use     Past Surgical History:  Procedure Laterality Date  . CHOLECYSTECTOMY    . IR FLUORO GUIDE PORT INSERTION RIGHT  01/04/2017  . IR US GUIDE BX ASP/DRAIN  01/04/2017  . IR US GUIDE VASC ACCESS RIGHT  01/04/2017  . TONSILLECTOMY      Allergies: Chantix [varenicline]; Methotrexate derivatives; Shellfish allergy; and Sulfonamide derivatives  Medications: Prior to Admission medications   Medication Sig Start Date End Date Taking? Authorizing Provider  amoxicillin-clavulanate (AUGMENTIN) 875-125 MG tablet Take 1 tablet by mouth 2 (two) times  daily. 04/21/17  Yes Owens Shark, NP  HYDROcodone-acetaminophen (NORCO/VICODIN) 5-325 MG tablet Take 1 tablet by mouth every 4 (four) hours as needed for moderate pain or severe pain.    Yes [provider]  morphine (MS CONTIN) 15 MG 12 hr tablet Take 1 tablet (15 mg total) by mouth every 12 (twelve) hours. Patient taking differently: Take 15 mg by mouth at bedtime.  04/26/17  Yes Ladell Pier, MD  pantoprazole (PROTONIX) 40 MG tablet TAKE ONE (1) TABLET BY MOUTH EVERY DAY 04/12/17  Yes Owens Shark, NP  predniSONE (DELTASONE) 5 MG tablet Take 1 tablet by mouth daily. 11/21/16  Yes [provider]  pseudoephedrine-guaifenesin (MUCINEX D) 60-600 MG 12 hr tablet Take 1 tablet by mouth every 12 (twelve) hours.   Yes [provider]  lidocaine-prilocaine (EMLA) cream Apply 1 application topically as needed. Apply a small amount of emla cream on a cotton ball and place cream over port site 1-2 hours prior to treatmetn. Do not rub in . Cover with plastic wrap 01/18/17   Ladell Pier, MD     Family History  Problem Relation Age of Onset  . Uterine cancer Mother   . Hyperlipidemia Father   . CVA Father   . Head & neck cancer Father   . Testicular cancer Son     Social History   Socioeconomic History  . Marital status: Married    Spouse name: Not on file  . Number of children: Not on file  . Years of education: Not on file  .  Highest education level: Not on file  Occupational History  . Occupation: Diplomatic Services operational officer  Social Needs  . Financial resource strain: Not on file  . Food insecurity:    Worry: Not on file    Inability: Not on file  . Transportation needs:    Medical: Not on file    Non-medical: Not on file  Tobacco Use  . Smoking status: Former Smoker    Types: Cigarettes    Last attempt to quit: 03/03/2001    Years since quitting: 16.1  . Smokeless tobacco: Never Used  Substance and Sexual Activity  . Alcohol use: Yes    Comment: social  .  Drug use: No  . Sexual activity: Yes    Partners: Female    Comment: married  Lifestyle  . Physical activity:    Days per week: Not on file    Minutes per session: Not on file  . Stress: Not on file  Relationships  . Social connections:    Talks on phone: Not on file    Gets together: Not on file    Attends religious service: Not on file    Active member of club or organization: Not on file    Attends meetings of clubs or organizations: Not on file    Relationship status: Not on file  Other Topics Concern  . Not on file  Social History Narrative  . Not on file   Review of Systems: A 12 point ROS discussed and pertinent positives are indicated in the HPI above.  All other systems are negative. Review of Systems  Vital Signs: BP 105/80   Pulse 71   Temp (!) 97.3 F (36.3 C) (Oral)   Resp (!) 31   Ht 5\' 8"  (1.727 m)   Wt 165 lb 9.1 oz (75.1 kg)   SpO2 98%   BMI 25.17 kg/m   Physical Exam  Constitutional: He is oriented to person, place, and time. He appears well-developed.  HENT:  Head: Normocephalic and atraumatic.  Eyes: EOM are normal.  Cardiovascular: Normal rate and regular rhythm.  Pulmonary/Chest:  Was on BiPAP, now transitioned to NRB  Musculoskeletal: Normal range of motion.  Neurological: He is alert and oriented to person, place, and time.  Skin: Skin is warm and dry.  Psychiatric: He has a normal mood and affect. His behavior is normal. Judgment and thought content normal.  Vitals reviewed.   Imaging: Dg Chest 1 View  Result Date: 04/24/2017 CLINICAL DATA:  Status post thoracentesis, history of esophageal cancer EXAM: CHEST  1 VIEW COMPARISON:  CT chest 04/21/2017, radiograph 04/12/2017 FINDINGS: Small bilateral pleural effusions. Right-sided central venous port tip overlies SVC. No pneumothorax identified. Masslike consolidation in the right upper lobe. Patchy infiltrate in the right peripheral lower lung. Stable cardiomediastinal silhouette. No  pneumothorax. IMPRESSION: 1. Small right pleural effusion. Similar appearance of left pleural effusion or thickening. No pneumothorax seen. 2. Slight increased interstitial opacity in the right thorax. Similar appearance of masslike opacity in the right upper lobe with increased patchy infiltrate in the peripheral right lower lung. Electronically Signed   By: Donavan Foil M.D.   On: 04/24/2017 14:24   Dg Chest 2 View  Result Date: 04/12/2017 CLINICAL DATA:  Persistent cough. EXAM: CHEST - 2 VIEW COMPARISON:  CT chest 03/13/2017. most recent chest x-ray is from 09/14/2011. FINDINGS: Similar appearance of left chronic pleural effusion. Relatively diffuse airspace disease noted in the right mid and upper lung, difficult to directly compare to CT scan from  1 month ago, but likely similar. Heart size upper normal. Right Port-A-Cath tip overlies the distal SVC. The visualized bony structures of the thorax are intact. IMPRESSION: Diffuse airspace disease right lung, similar to prior chest CT. Chronic left pleural effusion. Electronically Signed   By: Misty Stanley M.D.   On: 04/12/2017 11:01   Ct Chest Wo Contrast  Result Date: 04/22/2017 CLINICAL DATA:  Esophageal cancer, diagnosed 12/15/2016, chemotherapy ongoing EXAM: CT CHEST WITHOUT CONTRAST TECHNIQUE: Multidetector CT imaging of the chest was performed following the standard protocol without IV contrast. COMPARISON:  03/13/2016 FINDINGS: Cardiovascular: Heart is normal in size.  No pericardial effusion. No evidence of thoracic aortic aneurysm. Very mild atherosclerotic calcifications of the aortic arch. Mild coronary atherosclerosis of the LAD (series 2/image 89). Right chest port terminates in the lower SVC. Mediastinum/Nodes: Mild progression of thoracic lymphadenopathy, including: --9 mm short axis left supraclavicular node (series 2/image 15), new --2.3 cm short axis right paratracheal node (series 2/image 57), unchanged --1.5 cm short axis AP window  node (series 2/image 9), previously 1.3 cm --1.8 cm short axis right hilar node (series 2/image 69), previously 1.6 cm --1.6 cm short axis subcarinal node (series 2/image 35), previously 1.4 cm --11 mm short axis lower paraesophageal node (series 2/image 110), new Equivocal wall thickening involving the distal esophagus just above the GE junction (sagittal image 112). Visualized thyroid is grossly unremarkable. Lungs/Pleura: 5.5 x 5.0 cm mass-like opacity in the right upper lobe (series 5/image 51), reflecting progression from prior CT. This appearance is worrisome for tumor. Surrounding increased interstitial markings in the right upper lobe suggests lymphangitic spread (series 5/image 60). 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 Moderate right pleural effusion, new. Associated mild right basilar atelectasis. Small left pleural effusion, grossly unchanged. No pneumothorax. Upper Abdomen: Visualized upper abdomen is notable for a 1.5 cm hepatic lesion inferiorly in segment 5 (series 2/image 134), grossly unchanged but incompletely visualized. Left para-aortic lymphadenopathy measuring up to 1.2 cm short axis (series 2/image 134), previously 11 mm. Musculoskeletal: Visualized osseous structures are within normal limits. IMPRESSION: Equivocal wall thickening involving the distal esophagus just above the GE junction, possibly reflecting the site of the patient's treated primary tumor. Progression of thoracic and upper abdominal nodal metastases, as above. 5.5 cm masslike opacity in the right upper lobe, progressed, worrisome for tumor. Associated lymphangitic spread in the right lung. Moderate right pleural effusion, new. Small left pleural effusion, grossly unchanged. 15 mm hepatic metastasis, grossly unchanged but incompletely visualized. Additional known hepatic metastasis is not imaged. Aortic  Atherosclerosis (ICD10-I70.0). Electronically Signed   By: Julian Hy M.D.   On: 04/22/2017 09:20   Ct Angio Chest Pe W And/or Wo Contrast  Result Date: 04/28/2017 CLINICAL DATA:  Esophageal cancer. Ongoing chemotherapy. Patient complains of shortness breath. EXAM: CT ANGIOGRAPHY CHEST WITH CONTRAST TECHNIQUE: Multidetector CT imaging of the chest was performed using the standard protocol during bolus administration of intravenous contrast. Multiplanar CT image reconstructions and MIPs were obtained to evaluate the vascular anatomy. CONTRAST:  166mL ISOVUE-370 IOPAMIDOL (ISOVUE-370) INJECTION 76% COMPARISON:  04/21/2017 FINDINGS: Cardiovascular: Satisfactory opacification of the pulmonary arteries to the segmental level. No evidence of pulmonary embolism. Mildly enlarged heart. Tiny pericardial effusion. Mild coronary artery disease. Central venous catheter terminates in the distal superior vena cava. Mediastinum/Nodes: Enlarged bilateral mediastinal lymph nodes are not significantly changed with the largest node in right pretracheal location measuring 1.9  cm. Again seen is circumferential thickening of the distal esophagus just above the GE junction. The thyroid is grossly unremarkable. Lungs/Pleura: Right upper lobe pulmonary mass appears more fragmented and measures approximately 4.4 x 5.1 cm. There is subpleural soft tissue thickening extending inferiorly along the anterior pleura of the right upper lobe and right middle lobe, likely representing extension of disease. There is persistent interstitial thickening in the right upper lobe and right middle lobe suggestive of lymphangitic spread of disease. Interval development of interstitial thickening in the left lung, with upper lobe predominance. Stable moderate in size left pleural effusion. The right pleural effusion demonstrates loculations. Upper Abdomen: Interval development of water density pleural effusion, moderate. Musculoskeletal: No chest  wall abnormality. No acute or significant osseous findings. Review of the MIP images confirms the above findings. IMPRESSION: No evidence of pulmonary embolus. Previously demonstrated right upper lobe malignancy appears more fragmented and measures slightly smaller at 5.1 cm in greatest dimension. Progression of lymphangitic spread of disease in the right upper and right middle lobes. Interval development of interstitial thickening throughout the left lung with upper lobe predominance. This may represent contralateral lymphangitic spread of malignancy versus development of pulmonary edema. Interval development of loculations within the known right pleural effusion. Stable moderate in size left pleural effusion. Interval development of abdominal ascites. Stable thickening of the distal esophagus. Electronically Signed   By: Fidela Salisbury M.D.   On: 04/28/2017 19:59   Dg Chest Port 1 View  Result Date: 04/28/2017 CLINICAL DATA:  Shortness of breath EXAM: PORTABLE CHEST 1 VIEW COMPARISON:  04/24/2017, CT chest 04/21/2017 FINDINGS: Right-sided central venous port tip over the SVC. Small left greater than right pleural effusion, similar on the left slight increase on the right. Progressive consolidation in the right upper lung. Stable cardiomediastinal silhouette. Vascular congestion. Diffuse interstitial opacity as before. IMPRESSION: 1. Similar appearance of left pleural effusion. Slight increased small right pleural effusion 2. Worsening consolidation in the right mid to upper lung which may reflect combination of mass and adjacent pneumonia or inflammatory process 3. Similar appearance of diffuse interstitial disease as compared with recent priors. Electronically Signed   By: Donavan Foil M.D.   On: 04/28/2017 18:28   US Thoracentesis Asp Pleural Space W/img Guide  Result Date: 04/24/2017 INDICATION: Metastatic esophageal cancer, dyspnea, right pleural effusion. Request made for diagnostic and  therapeutic right thoracentesis. EXAM: ULTRASOUND GUIDED DIAGNOSTIC AND THERAPEUTIC RIGHT THORACENTESIS MEDICATIONS: None COMPLICATIONS: None immediate. PROCEDURE: An ultrasound guided thoracentesis was thoroughly discussed with the patient and questions answered. The benefits, risks, alternatives and complications were also discussed. The patient understands and wishes to proceed with the procedure. Written consent was obtained. Ultrasound was performed to localize and mark an adequate pocket of fluid in the right chest. The area was then prepped and draped in the normal sterile fashion. 1% Lidocaine was used for local anesthesia. Under ultrasound guidance a 6 Fr Safe-T-Centesis catheter was introduced. Thoracentesis was performed. The catheter was removed and a dressing applied. FINDINGS: A total of approximately 2 liters of bloody fluid was removed. Samples were sent to the laboratory as requested by the clinical team. IMPRESSION: Successful ultrasound guided diagnostic and therapeutic right thoracentesis yielding 2 liters of pleural fluid. Dr. Benay Spice was notified of the above findings. Follow-up chest x-ray revealed no pneumothorax. Read by: Rowe Robert, PA-C Electronically Signed   By: Marybelle Killings M.D.   On: 04/24/2017 14:22    Labs:  CBC: Recent Labs    02/15/17 0844 03/01/17  2122  04/17/17 1109 04/26/17 0757 04/28/17 1808 04/29/17 0300  WBC 10.0 10.5*   < > 10.6* 18.1* 22.1* 17.5*  HGB 10.2* 10.1*  --   --   --  11.3* 9.9*  HCT 33.4* 32.2*   < > 39.0 39.3 35.4* 31.0*  PLT 267 350   < > 310 286 310 288   < > = values in this interval not displayed.    COAGS: Recent Labs    01/04/17 1135  INR 1.03    BMP: Recent Labs    04/17/17 1109 04/26/17 0757 04/28/17 1808 04/29/17 0300  NA 139 142 139 145  K 4.0 4.1 3.9 4.1  CL 107 107 104 112*  CO2 22 22 24 24   GLUCOSE 113 133 118* 137*  BUN 22 23 25* 20  CALCIUM 10.0 10.1 9.1 8.9  CREATININE 0.85 0.81 0.71 0.78  GFRNONAA  >60 >60 >60 >60  GFRAA >60 >60 >60 >60    LIVER FUNCTION TESTS: Recent Labs    03/29/17 0806 04/12/17 0929 04/17/17 1109 04/26/17 0757  BILITOT 0.3 0.3 0.3 0.5  AST 27 39* 40* 43*  ALT 20 29 31  43  ALKPHOS 172* 173* 254* 447*  PROT 6.8 7.2 7.6 6.9  ALBUMIN 2.9* 2.9* 2.9* 2.1*    TUMOR MARKERS: No results for input(s): AFPTM, CEA, CA199, CHROMGRNA in the last 8760 hours.  Assessment and Plan:  Malignant pleural effusion.  Chart and images reviewed by Dr. Annamaria Boots.  Will be a good candidate for PleurX on the left when WBC normalizes and patient medically stable and if he is agreeable to proceed..  Can do a thoracentesis in the meantime, but given the little impact it had on him previously, doubt it would help much. And if thoracentesis is done, likely there will not be enough fluid to place the Pleural catheter when WBC normal, unless the effusion is re-accumulating rapidly.  Thank you for this interesting consult.  I greatly enjoyed meeting [facility] and look forward to participating in their care.  A copy of this report was sent to the requesting provider on this date.  Electronically Signed: Murrell Redden, PA-C 04/29/2017, 11:19 AM   I spent a total of 40 Minutes in face to face in clinical consultation, greater than 50% of which was counseling/coordinating care for consideration for pleurx.

## 2017-04-29 NOTE — Progress Notes (Signed)
Pharmacy Antibiotic Note  Grant Todd is a 62 y.o. male with hx metastatic esophageal cancer on pembrolizumab, admitted to the ED on 04/28/2017 with SOB.  Vancomycin and cefepime started on admission for sepsis (? SBP, PNA).  - 3/30 CXR: Worsening consolidation in the right mid to upper lung which may reflect combination of mass and adjacent pneumonia or inflammatory process  - afeb, wbc elevated but down scr 0.78 (crcl~75)   Plan: - adjust vancomycin dose to 1000 mg IV q12h for est AUC 515 - continue cefepime 2gm IV q8h  _______________________________________________  Height: 5\' 8"  (172.7 cm) Weight: 165 lb 9.1 oz (75.1 kg) IBW/kg (Calculated) : 68.4  Temp (24hrs), Avg:98.1 F (36.7 C), Min:97.3 F (36.3 C), Max:98.8 F (37.1 C)  Recent Labs  Lab 04/26/17 0757 04/28/17 1808 04/28/17 2123 04/28/17 2228 04/28/17 2252 04/29/17 0300  WBC 18.1* 22.1*  --   --   --  17.5*  CREATININE 0.81 0.71  --   --   --  0.78  LATICACIDVEN  --   --  1.95* 2.0* 1.96*  --     Estimated Creatinine Clearance: 90.3 mL/min (by C-G formula based on SCr of 0.78 mg/dL).    Allergies  Allergen Reactions  . Chantix [Varenicline] Other (See Comments)    Vivid dreams and lethargic  . Methotrexate Derivatives     Caused pleural effusions  . Shellfish Allergy Hives  . Sulfonamide Derivatives Rash    Antimicrobials this admission:  3/29 Vanc >> 3/29 Cefepime  >>   Dose adjustments this admission:  --  Microbiology results:  3/29 BCx x2:  3/30 MRSA PCR: neg 3/30 resp panel pcr:    Thank you for allowing pharmacy to be a part of this patient's care.  Lynelle Doctor 04/29/2017 9:21 AM

## 2017-04-29 NOTE — Progress Notes (Signed)
THIS IS THE HISTORY AND PHYSICAL.. ..  Name: Grant Todd [name]: [id number] DOB: 1955-05-08    ADMISSION DATE:  04/28/2017 CONSULTATION DATE:  04/28/17  REFERRING MD :  Laurann Montana MD  CHIEF COMPLAINT:  SOB\  \BRIEF PATIENT DESCRIPTION: 62 yr old male with metastatic esophageal cancer s/p 8 cycles of FOLCOX and recently started on pembrolizumab on 04/26/17 s/p thoracentesis of right pleural effusion on 04/24/17  Now presents with shortness of breath from pulmonary office and increased HR.   SIGNIFICANT EVENTS  SOB not improved with [place] started on NIPPV  HR 170s started on Cardizem   STUDIES:  CXR shows moderate left effusion and small right loculated effusion with underlying lymphangitic spread and existing metastatic disease. CTA showed no PE.  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 Has received 8 cycles of FOLFOX despite this lymphangitic spread to the right lung with pleural effusion and was started with pembrolizumab 3/27. Pt denies any adverse reactions while receiving chemotherapy on 27th.   Pt was seen at outpatient pulmonologist today his O2 sats were in the 80s and not improving along with increased HR and they sent him into the ER.   Per EDP's documentation: Pt has been getting short of breath esp on exertion over the last several weeks and although he received a thoracentesis on Monday it did not help much.  On my evaluation pt is wearing a NIPPV he is alert and verbal. He is accompanied by his wife.  He states that his breathing has improved with current interventions.  He denies chest or abdominal pain, fevers, chills, constipation, diarrhea. He does notice a new  rash on his hands that has improved since coming to the hospital and was not there prior to the 27th.  SUBJECTIVE:  Remains on BiPAP with 80% FiO2, desaturated overnight with BiPAP off  VITAL SIGNS: Temp:  [97.3 F (36.3 C)-98.8 F (37.1 C)] 97.3 F (36.3 C) (03/30 0800) Pulse Rate:  [64-166] 71 (03/30 0900) Resp:  [19-35] 31 (03/30 0900) BP: (94-132)/(63-101) 105/80 (03/30 0900) SpO2:  [94 %-100 %] 100 % (03/30 0900) FiO2 (%):  [80 %-100 %] 80 % (03/30 0350) Weight:  [163 lb (73.9 kg)-165 lb 9.1 oz (75.1 kg)] 165 lb 9.1 oz (75.1 kg) (03/30 0109)  PHYSICAL EXAMINATION: General:  Alert well nourished in good spirits Neuro:  No focal deficits appreciated GCS 15 HEENT:  BiPAP mask in place, speaking in full sentences Cardiovascular:  RRR, Nl S1/S2 and -M/R/G. Lungs:  Decrease BS on the right with clear left lung Abdomen:  Soft, NT, ND and +BS Musculoskeletal:  No gross deformities Skin:  Non blanching ertyhematous rash on bilateral hands not involving palms.  Recent Labs  Lab 04/26/17 0757 04/28/17 1808 04/29/17 0300  NA 142 139 145  K 4.1 3.9 4.1  CL 107 104 112*  CO2 22 24 24   BUN 23 25* 20  CREATININE 0.81 0.71 0.78  GLUCOSE 133 118* 137*   Recent Labs  Lab 04/26/17 0757 04/28/17 1808 04/29/17 0300  HGB  --  11.3* 9.9*  HCT 39.3 35.4* 31.0*  WBC 18.1* 22.1* 17.5*  PLT 286 310 288   Ct Angio Chest Pe W And/or Wo Contrast  Result Date: 04/28/2017 CLINICAL DATA:  Esophageal  cancer. Ongoing chemotherapy. Patient complains of shortness breath. EXAM: CT ANGIOGRAPHY CHEST WITH CONTRAST TECHNIQUE: Multidetector CT imaging of the chest was performed using the standard protocol during bolus administration of intravenous contrast. Multiplanar CT image reconstructions and MIPs were obtained to evaluate the vascular anatomy. CONTRAST:  129mL ISOVUE-370 IOPAMIDOL (ISOVUE-370) INJECTION 76% COMPARISON:  04/21/2017 FINDINGS: Cardiovascular: Satisfactory opacification of the  pulmonary arteries to the segmental level. No evidence of pulmonary embolism. Mildly enlarged heart. Tiny pericardial effusion. Mild coronary artery disease. Central venous catheter terminates in the distal superior vena cava. Mediastinum/Nodes: Enlarged bilateral mediastinal lymph nodes are not significantly changed with the largest node in right pretracheal location measuring 1.9 cm. Again seen is circumferential thickening of the distal esophagus just above the GE junction. The thyroid is grossly unremarkable. Lungs/Pleura: Right upper lobe pulmonary mass appears more fragmented and measures approximately 4.4 x 5.1 cm. There is subpleural soft tissue thickening extending inferiorly along the anterior pleura of the right upper lobe and right middle lobe, likely representing extension of disease. There is persistent interstitial thickening in the right upper lobe and right middle lobe suggestive of lymphangitic spread of disease. Interval development of interstitial thickening in the left lung, with upper lobe predominance. Stable moderate in size left pleural effusion. The right pleural effusion demonstrates loculations. Upper Abdomen: Interval development of water density pleural effusion, moderate. Musculoskeletal: No chest wall abnormality. No acute or significant osseous findings. Review of the MIP images confirms the above findings. IMPRESSION: No evidence of pulmonary embolus. Previously demonstrated right upper lobe malignancy appears more fragmented and measures slightly smaller at 5.1 cm in greatest dimension. Progression of lymphangitic spread of disease in the right upper and right middle lobes. Interval development of interstitial thickening throughout the left lung with upper lobe predominance. This may represent contralateral lymphangitic spread of malignancy versus development of pulmonary edema. Interval development of loculations within the known right pleural effusion. Stable moderate in size  left pleural effusion. Interval development of abdominal ascites. Stable thickening of the distal esophagus. Electronically Signed   By: Fidela Salisbury M.D.   On: 04/28/2017 19:59   Dg Chest Port 1 View  Result Date: 04/28/2017 CLINICAL DATA:  Shortness of breath EXAM: PORTABLE CHEST 1 VIEW COMPARISON:  04/24/2017, CT chest 04/21/2017 FINDINGS: Right-sided central venous port tip over the SVC. Small left greater than right pleural effusion, similar on the left slight increase on the right. Progressive consolidation in the right upper lung. Stable cardiomediastinal silhouette. Vascular congestion. Diffuse interstitial opacity as before. IMPRESSION: 1. Similar appearance of left pleural effusion. Slight increased small right pleural effusion 2. Worsening consolidation in the right mid to upper lung which may reflect combination of mass and adjacent pneumonia or inflammatory process 3. Similar appearance of diffuse interstitial disease as compared with recent priors. Electronically Signed   By: Donavan Foil M.D.   On: 04/28/2017 18:28   ASSESSMENT / PLAN: NEURO: No acute issues No history of mets to the brain GCS 15 Not in any pain at this time  CARDIAC: Multifocal Atrial Tachycardia secondary to Respiratory status HR has improved to 90s on my evaluation Pt continues on Cardizem Trace pericardial effusion on CTA Hemodynamically stable not requiring vasopressors ( MAP goal>27mmHg) Has a Port a Cath in the right chest wall. PIVs for access.  PULMONARY: Acute Hypoxic Respiratory Failure requiring NIPPV Metastatic Pleural Effusions (small loculated right, moderate left) Lymphangitic Spread and RUL mass with extension disease to RUL RML Interval development of interstitial thickening throughout the left  lung with upper lobe predominance S/p Keytruda (Pembrolizumab) on 3/27 CTA negative for PE Given nature of effusions pt may be a candidate for a PleurX on left but not enough right  now Right effusion is small and loculated, no thora for now Respiratory issues may also be complicated by worsening ascites.  Post Obstructive Pneumonia? WBC is elevated and effusions although present are stable Cover with broad spectrum abx Vancomycin and Cefepime RVP, Blood cx, r/o infection Send PCT if negative will deescalate  LA<2 Start steroids, solumedrol  60 mg IV q6 hours Continue cefepime and vanc  ID: SEPSIS Post Obstructive Pneumonia? WBC is elevated and effusions although present are stable Cover with broad spectrum abx Vancomycin and Cefepime RVP, Blood cx, r/o infection Send PCT if negative will deescalate  LA<2 Spontaneous Bacterial Peritonitis Interval development of Abdominal ascites  WBC elevated to 22 Vancomycin and Cefepime (cephalopsorin should cover for poss SBP) F/u PCT  Endocrine: No active issues at this time No H/o insulin dependence  GI: Change NIV to PRN Begin regular diet Start on PPI  Heme: No signs of active bleeding Hgb stable No active coagulopathy While in patient  Lovenox for DVT PPx With SCDs  RENAL No active issues @Baseline  Cr with no AKI .Marland Kitchen Lab Results  Component Value Date   CREATININE 0.78 04/29/2017   CREATININE 0.71 04/28/2017   CREATININE 0.81 04/26/2017  Electrolyte goals: K= 4, Mg 2 Phos 2  Discussed with [facility]-MD, will continue abx, steroids started, BiPAP for respiratory failure, may need palliative involvement on Monday if remains on BiPAP.  The patient is critically ill with multiple organ systems failure and requires high complexity decision making for assessment and support, frequent evaluation and titration of therapies, application of advanced monitoring technologies and extensive interpretation of multiple databases.   Critical Care Time devoted to patient care services described in this note is  35  Minutes. This time reflects time of care of this signee Dr Jennet Maduro. This critical care time does not  reflect procedure time, or teaching time or supervisory time of PA/NP/Med student/Med Resident etc but could involve care discussion time.  Rush Farmer, M.D. [facility] Pulmonary/Critical Care Medicine. Pager: [phone]. After hours pager: [phone].  04/29/2017, 10:10 AM

## 2017-04-29 NOTE — Progress Notes (Signed)
eLink Physician-Brief Progress Note Patient Name: Grant Todd DOB: 1955-12-22 [name]: [id number]   Date of Service  04/29/2017  HPI/Events of Note  Patient requests a sleeping aid.   eICU Interventions  Will order: 1. Ambien 5 mg PO now.      Intervention Category Major Interventions: Other:  [name] 04/29/2017, 9:36 PM

## 2017-04-29 NOTE — Progress Notes (Signed)
Pt had run of wide QRS complex/ SVT. Nonsymptomatic. Nelda Marseille MD made aware. No new orders at this time. Will continue to monitor.

## 2017-04-30 ENCOUNTER — Other Ambulatory Visit: Payer: Self-pay

## 2017-04-30 DIAGNOSIS — G9340 Encephalopathy, unspecified: Secondary | ICD-10-CM

## 2017-04-30 DIAGNOSIS — J189 Pneumonia, unspecified organism: Secondary | ICD-10-CM

## 2017-04-30 LAB — BASIC METABOLIC PANEL
ANION GAP: 8 (ref 5–15)
BUN: 20 mg/dL (ref 6–20)
CALCIUM: 9.2 mg/dL (ref 8.9–10.3)
CO2: 25 mmol/L (ref 22–32)
CREATININE: 0.54 mg/dL — AB (ref 0.61–1.24)
Chloride: 108 mmol/L (ref 101–111)
Glucose, Bld: 140 mg/dL — ABNORMAL HIGH (ref 65–99)
Potassium: 3.8 mmol/L (ref 3.5–5.1)
Sodium: 141 mmol/L (ref 135–145)

## 2017-04-30 LAB — PROCALCITONIN: PROCALCITONIN: 0.46 ng/mL

## 2017-04-30 LAB — CBC
HCT: 30.3 % — ABNORMAL LOW (ref 39.0–52.0)
Hemoglobin: 9.4 g/dL — ABNORMAL LOW (ref 13.0–17.0)
MCH: 28.7 pg (ref 26.0–34.0)
MCHC: 31 g/dL (ref 30.0–36.0)
MCV: 92.4 fL (ref 78.0–100.0)
PLATELETS: 289 10*3/uL (ref 150–400)
RBC: 3.28 MIL/uL — ABNORMAL LOW (ref 4.22–5.81)
RDW: 19.1 % — AB (ref 11.5–15.5)
WBC: 19.7 10*3/uL — AB (ref 4.0–10.5)

## 2017-04-30 LAB — PHOSPHORUS: PHOSPHORUS: 2.9 mg/dL (ref 2.5–4.6)

## 2017-04-30 LAB — MAGNESIUM: Magnesium: 1.9 mg/dL (ref 1.7–2.4)

## 2017-04-30 MED ORDER — MAGNESIUM SULFATE 2 GM/50ML IV SOLN
2.0000 g | Freq: Once | INTRAVENOUS | Status: AC
  Administered 2017-04-30: 2 g via INTRAVENOUS
  Filled 2017-04-30: qty 50

## 2017-04-30 MED ORDER — POTASSIUM CHLORIDE CRYS ER 20 MEQ PO TBCR
40.0000 meq | EXTENDED_RELEASE_TABLET | Freq: Three times a day (TID) | ORAL | Status: AC
  Administered 2017-04-30 (×2): 40 meq via ORAL
  Filled 2017-04-30 (×2): qty 2

## 2017-04-30 MED ORDER — FUROSEMIDE 10 MG/ML IJ SOLN
40.0000 mg | Freq: Three times a day (TID) | INTRAMUSCULAR | Status: AC
  Administered 2017-04-30 (×2): 40 mg via INTRAVENOUS
  Filled 2017-04-30 (×2): qty 4

## 2017-04-30 NOTE — Progress Notes (Signed)
THIS IS THE HISTORY AND PHYSICAL.. ..  Name: Grant Todd [name]: [id number] DOB: 19-Feb-1955    ADMISSION DATE:  04/28/2017 CONSULTATION DATE:  04/28/17  REFERRING MD :  Laurann Montana MD  CHIEF COMPLAINT:  SOB\  \BRIEF PATIENT DESCRIPTION: 62 yr old male with metastatic esophageal cancer s/p 8 cycles of FOLCOX and recently started on pembrolizumab on 04/26/17 s/p thoracentesis of right pleural effusion on 04/24/17  Now presents with shortness of breath from pulmonary office and increased HR.   SIGNIFICANT EVENTS  SOB not improved with [place] started on NIPPV  HR 170s started on Cardizem   STUDIES:  CXR shows moderate left effusion and small right loculated effusion with underlying lymphangitic spread and existing metastatic disease. CTA showed no PE.  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 Has received 8 cycles of FOLFOX despite this lymphangitic spread to the right lung with pleural effusion and was started with pembrolizumab 3/27. Pt denies any adverse reactions while receiving chemotherapy on 27th.   Pt was seen at outpatient pulmonologist today his O2 sats were in the 80s and not improving along with increased HR and they sent him into the ER.   Per EDP's documentation: Pt has been getting short of breath esp on exertion over the last several weeks and although he received a thoracentesis on Monday it did not help much.  On my evaluation pt is wearing a NIPPV he is alert and verbal. He is accompanied by his wife.  He states that his breathing has improved with current interventions.  He denies chest or abdominal pain, fevers, chills, constipation, diarrhea. He does notice a new  rash on his hands that has improved since coming to the hospital and was not there prior to the 27th.  SUBJECTIVE:  Off BiPAP and breathing comfortable on HFNC Hemoptysis x2 overnight Confused after using ambien  VITAL SIGNS: Temp:  [97.3 F (36.3 C)-98.4 F (36.9 C)] 97.6 F (36.4 C) (03/31 0800) Pulse Rate:  [73-107] 107 (03/31 0900) Resp:  [19-32] 29 (03/31 0900) BP: (101-142)/(61-105) 126/105 (03/31 0900) SpO2:  [90 %-100 %] 90 % (03/31 0900) FiO2 (%):  [100 %] 100 % (03/30 1025) Weight:  [166 lb 7.2 oz (75.5 kg)] 166 lb 7.2 oz (75.5 kg) (03/31 0343)  PHYSICAL EXAMINATION: General:  Alert and interactive, NAD Neuro:  Awake, moving all ext to command HEENT:  /AT, PERRL, EOM-I and MMM Cardiovascular:  RRR, Nl S1/S2 and -M/R/G Lungs:  Decrease BS on the right with clear left lung Abdomen:  Soft, NT, ND and +BS Musculoskeletal:  No gross deformities Skin:  Non blanching ertyhematous rash on bilateral hands not involving palms.  Recent Labs  Lab 04/28/17 1808 04/29/17 0300 04/30/17 0341  NA 139 145 141  K 3.9 4.1 3.8  CL 104 112* 108  CO2 24 24 25   BUN 25* 20 20  CREATININE 0.71 0.78 0.54*  GLUCOSE 118* 137* 140*   Recent Labs  Lab 04/28/17 1808 04/29/17 0300 04/30/17 0341  HGB 11.3* 9.9* 9.4*  HCT 35.4* 31.0* 30.3*  WBC 22.1* 17.5* 19.7*  PLT 310 288 289   Ct Angio Chest Pe W And/or Wo Contrast  Result Date: 04/28/2017 CLINICAL DATA:  Esophageal cancer. Ongoing chemotherapy. Patient complains of shortness breath.  EXAM: CT ANGIOGRAPHY CHEST WITH CONTRAST TECHNIQUE: Multidetector CT imaging of the chest was performed using the standard protocol during bolus administration of intravenous contrast. Multiplanar CT image reconstructions and MIPs were obtained to evaluate the vascular anatomy. CONTRAST:  161mL ISOVUE-370 IOPAMIDOL (ISOVUE-370) INJECTION 76% COMPARISON:  04/21/2017 FINDINGS: Cardiovascular: Satisfactory opacification of the pulmonary arteries to the  segmental level. No evidence of pulmonary embolism. Mildly enlarged heart. Tiny pericardial effusion. Mild coronary artery disease. Central venous catheter terminates in the distal superior vena cava. Mediastinum/Nodes: Enlarged bilateral mediastinal lymph nodes are not significantly changed with the largest node in right pretracheal location measuring 1.9 cm. Again seen is circumferential thickening of the distal esophagus just above the GE junction. The thyroid is grossly unremarkable. Lungs/Pleura: Right upper lobe pulmonary mass appears more fragmented and measures approximately 4.4 x 5.1 cm. There is subpleural soft tissue thickening extending inferiorly along the anterior pleura of the right upper lobe and right middle lobe, likely representing extension of disease. There is persistent interstitial thickening in the right upper lobe and right middle lobe suggestive of lymphangitic spread of disease. Interval development of interstitial thickening in the left lung, with upper lobe predominance. Stable moderate in size left pleural effusion. The right pleural effusion demonstrates loculations. Upper Abdomen: Interval development of water density pleural effusion, moderate. Musculoskeletal: No chest wall abnormality. No acute or significant osseous findings. Review of the MIP images confirms the above findings. IMPRESSION: No evidence of pulmonary embolus. Previously demonstrated right upper lobe malignancy appears more fragmented and measures slightly smaller at 5.1 cm in greatest dimension. Progression of lymphangitic spread of disease in the right upper and right middle lobes. Interval development of interstitial thickening throughout the left lung with upper lobe predominance. This may represent contralateral lymphangitic spread of malignancy versus development of pulmonary edema. Interval development of loculations within the known right pleural effusion. Stable moderate in size left pleural effusion.  Interval development of abdominal ascites. Stable thickening of the distal esophagus. Electronically Signed   By: Fidela Salisbury M.D.   On: 04/28/2017 19:59   Dg Chest Port 1 View  Result Date: 04/28/2017 CLINICAL DATA:  Shortness of breath EXAM: PORTABLE CHEST 1 VIEW COMPARISON:  04/24/2017, CT chest 04/21/2017 FINDINGS: Right-sided central venous port tip over the SVC. Small left greater than right pleural effusion, similar on the left slight increase on the right. Progressive consolidation in the right upper lung. Stable cardiomediastinal silhouette. Vascular congestion. Diffuse interstitial opacity as before. IMPRESSION: 1. Similar appearance of left pleural effusion. Slight increased small right pleural effusion 2. Worsening consolidation in the right mid to upper lung which may reflect combination of mass and adjacent pneumonia or inflammatory process 3. Similar appearance of diffuse interstitial disease as compared with recent priors. Electronically Signed   By: Donavan Foil M.D.   On: 04/28/2017 18:28   ASSESSMENT / PLAN: NEURO: No acute issues No history of mets to the brain GCS 15 Not in any pain at this time Minimize sedation  CARDIAC: Multifocal Atrial Tachycardia secondary to Respiratory status HR has improved to 90s on my evaluation Pt continues on Cardizem Trace pericardial effusion on CTA Hemodynamically stable not requiring vasopressors ( MAP goal>21mmHg) Access through port cath on the right PIVs for access.  PULMONARY: Acute Hypoxic Respiratory Failure requiring NIPPV Metastatic Pleural Effusions (small loculated right, moderate left) Lymphangitic Spread and RUL mass with extension disease to RUL RML Interval development of interstitial thickening throughout the left lung with upper lobe predominance S/p Keytruda (Pembrolizumab) on  3/27 CTA negative for PE Given nature of effusions pt may be a candidate for a PleurX on left but not enough right now, IR to  perform on Monday Right effusion is small and loculated, no thora for now Respiratory issues may also be complicated by worsening ascites.  Post Obstructive Pneumonia? WBC is elevated and effusions although present are stable Cover with broad spectrum abx Vancomycin and Cefepime RVP, Blood cx, r/o infection Send PCT if negative will deescalate  LA<2 Continue steroids, solumedrol  60 mg IV q6 hours Continue cefepime and vanc 2 doses of lasix today  ID: SEPSIS Post Obstructive Pneumonia? WBC is elevated and effusions although present are stable Cover with broad spectrum abx Vancomycin and Cefepime RVP, Blood cx, r/o infection Send PCT if negative will deescalate  LA<2 Spontaneous Bacterial Peritonitis Interval development of Abdominal ascites  WBC elevated to 22 Vancomycin and Cefepime (cephalopsorin should cover for poss SBP) PCT 0.88>>>0.46  Endocrine: No active issues at this time No H/o insulin dependence  GI: Regular diet PPI  Heme: No signs of active bleeding Hgb stable No active coagulopathy While in patient  Lovenox for DVT PPx With SCDs  RENAL No active issues @Baseline  Cr with no AKI .Marland Kitchen Lab Results  Component Value Date   CREATININE 0.54 (L) 04/30/2017   CREATININE 0.78 04/29/2017   CREATININE 0.71 04/28/2017  Electrolyte goals: K= 4, Mg 2 Phos 2  Transfer to SDU and to [facility] with PCCM consulting for respiratory failure 4/1  Rush Farmer, M.D. [facility] Pulmonary/Critical Care Medicine. Pager: [phone]. After hours pager: [phone].  04/30/2017, 10:14 AM

## 2017-04-30 NOTE — Plan of Care (Signed)
62 year old male with metastatic esophageal cancer treated with respiratory failure secondary to lung metastases, bilateral pleural effusion.  He was admitted 04/28/2017.  He had thoracentesis right pleural effusion on 04/26/2017.  Interventional radiology to do Pleurx catheter on the left [place].  CT scan of the chest negative for PE.  Continue Vanco and cefepime for now.  Patient was also has spontaneous bacterial peritonitis.PCCM will stay on to follow this patient.

## 2017-04-30 NOTE — Progress Notes (Signed)
Grant Todd   DOB:April 28, 1955   LH#:[id number]   LXB#:[id number]  Oncology follow-up  Subjective: I am covering Dr. Benay Spice to see the patient today.  He was admitted from the office/ED two days ago for respiratory failure and tachycardia, required BIPAP, improved now, on [place] oxygen. Sitting in chair, wife at bedside.    Objective:  Vitals:   04/30/17 0800 04/30/17 0900  BP: 116/68 (!) 126/105  Pulse: 77 (!) 107  Resp: (!) 23 (!) 29  Temp: 97.6 F (36.4 C)   SpO2: 97% 90%    Body mass index is 25.31 kg/m.  Intake/Output Summary (Last 24 hours) at 04/30/2017 1117 Last data filed at 04/30/2017 0547 Gross per 24 hour  Intake 900 ml  Output 125 ml  Net 775 ml     Sclerae unicteric  Oropharynx clear  No peripheral adenopathy  Lungs: decreased breath sound b/l lung bases   Heart regular rate and rhythm  Abdomen benign  MSK no focal spinal tenderness, no peripheral edema  Neuro nonfocal   CBG (last 3)  No results for input(s): GLUCAP in the last 72 hours.   Labs:  Lab Results  Component Value Date   WBC 19.7 (H) 04/30/2017   HGB 9.4 (L) 04/30/2017   HCT 30.3 (L) 04/30/2017   MCV 92.4 04/30/2017   PLT 289 04/30/2017   NEUTROABS 14.3 (H) 04/26/2017   CMP Latest Ref Rng & Units 04/30/2017 04/29/2017 04/28/2017  Glucose 65 - 99 mg/dL 140(H) 137(H) 118(H)  BUN 6 - 20 mg/dL 20 20 25(H)  Creatinine 0.61 - 1.24 mg/dL 0.54(L) 0.78 0.71  Sodium 135 - 145 mmol/L 141 145 139  Potassium 3.5 - 5.1 mmol/L 3.8 4.1 3.9  Chloride 101 - 111 mmol/L 108 112(H) 104  CO2 22 - 32 mmol/L '25 24 24  '$ Calcium 8.9 - 10.3 mg/dL 9.2 8.9 9.1  Total Protein 6.4 - 8.3 g/dL - - -  Total Bilirubin 0.2 - 1.2 mg/dL - - -  Alkaline Phos 40 - 150 U/L - - -  AST 5 - 34 U/L - - -  ALT 0 - 55 U/L - - -     Urine Studies No results for input(s): UHGB, CRYS in the last 72 hours.  Invalid input(s): UACOL, UAPR, USPG, UPH, UTP, UGL, UKET, UBIL, UNIT, UROB, [place], UEPI, UWBC, Duwayne Heck [place],  [place]  Basic Metabolic Panel: Recent Labs  Lab 04/26/17 0757 04/28/17 1808 04/28/17 2228 04/29/17 0300 04/30/17 0341  NA 142 139  --  145 141  K 4.1 3.9  --  4.1 3.8  CL 107 104  --  112* 108  CO2 22 24  --  24 25  GLUCOSE 133 118*  --  137* 140*  BUN 23 25*  --  20 20  CREATININE 0.81 0.71  --  0.78 0.54*  CALCIUM 10.1 9.1  --  8.9 9.2  MG  --   --  1.6* 1.7 1.9  PHOS  --   --  2.9 3.7 2.9   GFR Estimated Creatinine Clearance: 90.3 mL/min (A) (by C-G formula based on SCr of 0.54 mg/dL (L)). Liver Function Tests: Recent Labs  Lab 04/26/17 0757  AST 43*  ALT 43  ALKPHOS 447*  BILITOT 0.5  PROT 6.9  ALBUMIN 2.1*   No results for input(s): LIPASE, AMYLASE in the last 168 hours. No results for input(s): AMMONIA in the last 168 hours. Coagulation profile No results for input(s): INR, PROTIME in the last 168  hours.  CBC: Recent Labs  Lab 04/26/17 0757 04/28/17 1808 04/29/17 0300 04/30/17 0341  WBC 18.1* 22.1* 17.5* 19.7*  NEUTROABS 14.3*  --   --   --   HGB  --  11.3* 9.9* 9.4*  HCT 39.3 35.4* 31.0* 30.3*  MCV 92.7 91.0 90.9 92.4  PLT 286 310 288 289   Cardiac Enzymes: No results for input(s): CKTOTAL, CKMB, CKMBINDEX, TROPONINI in the last 168 hours. BNP: Invalid input(s): POCBNP CBG: No results for input(s): GLUCAP in the last 168 hours. D-Dimer No results for input(s): DDIMER in the last 72 hours. Hgb A1c No results for input(s): HGBA1C in the last 72 hours. Lipid Profile No results for input(s): CHOL, HDL, LDLCALC, TRIG, CHOLHDL, LDLDIRECT in the last 72 hours. Thyroid function studies No results for input(s): TSH, T4TOTAL, T3FREE, THYROIDAB in the last 72 hours.  Invalid input(s): FREET3 Anemia work up No results for input(s): VITAMINB12, FOLATE, FERRITIN, TIBC, IRON, RETICCTPCT in the last 72 hours. Microbiology Recent Results (from the past 240 hour(s))  Blood culture (routine x 2)     Status: None (Preliminary result)   Collection Time:  04/28/17  7:10 PM  Result Value Ref Range Status   Specimen Description   Final    BLOOD LEFT ANTECUBITAL Performed at [facility] [address], [place], [place] [address]    Special Requests   Final    BOTTLES DRAWN AEROBIC AND ANAEROBIC Blood Culture adequate volume Performed at [facility] [address], [place], [place] [address]    Culture   Final    NO GROWTH < 24 HOURS Performed at [facility] [address], [place], [place] [address]    Report Status PENDING  Incomplete  Respiratory Panel by PCR     Status: None   Collection Time: 04/29/17  1:17 AM  Result Value Ref Range Status   Adenovirus NOT DETECTED NOT DETECTED Final   Coronavirus 229E NOT DETECTED NOT DETECTED Final   Coronavirus HKU1 NOT DETECTED NOT DETECTED Final   Coronavirus NL63 NOT DETECTED NOT DETECTED Final   Coronavirus OC43 NOT DETECTED NOT DETECTED Final   Metapneumovirus NOT DETECTED NOT DETECTED Final   Rhinovirus / Enterovirus NOT DETECTED NOT DETECTED Final   Influenza A NOT DETECTED NOT DETECTED Final   Influenza B NOT DETECTED NOT DETECTED Final   Parainfluenza Virus 1 NOT DETECTED NOT DETECTED Final   Parainfluenza Virus 2 NOT DETECTED NOT DETECTED Final   Parainfluenza Virus 3 NOT DETECTED NOT DETECTED Final   Parainfluenza Virus 4 NOT DETECTED NOT DETECTED Final   Respiratory Syncytial Virus NOT DETECTED NOT DETECTED Final   Bordetella pertussis NOT DETECTED NOT DETECTED Final   Chlamydophila pneumoniae NOT DETECTED NOT DETECTED Final   Mycoplasma pneumoniae NOT DETECTED NOT DETECTED Final    Comment: Performed at [facility], [place] [address], [place], [place] [address]  MRSA PCR Screening     Status: None   Collection Time: 04/29/17  1:17 AM  Result Value Ref Range Status   MRSA by PCR NEGATIVE NEGATIVE Final    Comment:        The GeneXpert MRSA Assay (FDA approved for NASAL specimens only), is one component of a comprehensive  MRSA colonization surveillance program. It is not intended to diagnose MRSA infection nor to guide or monitor treatment for MRSA infections. Performed at [facility], [place] [address] [place], [place] [address]  Studies:  Ct Angio Chest Pe W And/or Wo Contrast  Result Date: 04/28/2017 CLINICAL DATA:  Esophageal cancer. Ongoing chemotherapy. Patient complains of shortness breath. EXAM: CT ANGIOGRAPHY CHEST WITH CONTRAST TECHNIQUE: Multidetector CT imaging of the chest was performed using the standard protocol during bolus administration of intravenous contrast. Multiplanar CT image reconstructions and MIPs were obtained to evaluate the vascular anatomy. CONTRAST:  158m ISOVUE-370 IOPAMIDOL (ISOVUE-370) INJECTION 76% COMPARISON:  04/21/2017 FINDINGS: Cardiovascular: Satisfactory opacification of the pulmonary arteries to the segmental level. No evidence of pulmonary embolism. Mildly enlarged heart. Tiny pericardial effusion. Mild coronary artery disease. Central venous catheter terminates in the distal superior vena cava. Mediastinum/Nodes: Enlarged bilateral mediastinal lymph nodes are not significantly changed with the largest node in right pretracheal location measuring 1.9 cm. Again seen is circumferential thickening of the distal esophagus just above the GE junction. The thyroid is grossly unremarkable. Lungs/Pleura: Right upper lobe pulmonary mass appears more fragmented and measures approximately 4.4 x 5.1 cm. There is subpleural soft tissue thickening extending inferiorly along the anterior pleura of the right upper lobe and right middle lobe, likely representing extension of disease. There is persistent interstitial thickening in the right upper lobe and right middle lobe suggestive of lymphangitic spread of disease. Interval development of interstitial thickening in the left lung, with upper lobe predominance. Stable moderate in size left pleural effusion. The right  pleural effusion demonstrates loculations. Upper Abdomen: Interval development of water density pleural effusion, moderate. Musculoskeletal: No chest wall abnormality. No acute or significant osseous findings. Review of the MIP images confirms the above findings. IMPRESSION: No evidence of pulmonary embolus. Previously demonstrated right upper lobe malignancy appears more fragmented and measures slightly smaller at 5.1 cm in greatest dimension. Progression of lymphangitic spread of disease in the right upper and right middle lobes. Interval development of interstitial thickening throughout the left lung with upper lobe predominance. This may represent contralateral lymphangitic spread of malignancy versus development of pulmonary edema. Interval development of loculations within the known right pleural effusion. Stable moderate in size left pleural effusion. Interval development of abdominal ascites. Stable thickening of the distal esophagus. Electronically Signed   By: DFidela SalisburyM.D.   On: 04/28/2017 19:59   Dg Chest Port 1 View  Result Date: 04/28/2017 CLINICAL DATA:  Shortness of breath EXAM: PORTABLE CHEST 1 VIEW COMPARISON:  04/24/2017, CT chest 04/21/2017 FINDINGS: Right-sided central venous port tip over the SVC. Small left greater than right pleural effusion, similar on the left slight increase on the right. Progressive consolidation in the right upper lung. Stable cardiomediastinal silhouette. Vascular congestion. Diffuse interstitial opacity as before. IMPRESSION: 1. Similar appearance of left pleural effusion. Slight increased small right pleural effusion 2. Worsening consolidation in the right mid to upper lung which may reflect combination of mass and adjacent pneumonia or inflammatory process 3. Similar appearance of diffuse interstitial disease as compared with recent priors. Electronically Signed   By: KDonavan FoilM.D.   On: 04/28/2017 18:28    Assessment: 63y.o. with metastatic  esophageal cancer, admitted for SOB   1. Respiratory failure secondary to lung metastasis, b/l plural effusion and possible infection, CTA negative for PE  2. Metastatic esophogeal cancer, started second line Keytruda on 04/26/17 due to disease progression  3. Atrial tachycardia, improved  4. Leukocytosis  5. Anemia secondary to malignancy and chemo  6. Code status: DNR   Plan:  -I agree with broad antibiotics to cover possible pneumonia and SBP, he clinically has improved since admission -  no need thoracentesis for now, possible left plurex on left, we discussed Beryle Flock does not cause compromised immune system, and would be ok to have pleurx if needed  -pt had many question about his cancer treatment, I reassured him him that Beryle Flock would be the best treatment option for now given his tumor PD-L1 CPS 30%, although it takes some time to work and response rate is only about 21.5%, based on the recent KEYNOTE-181 trial. I answered all pt and his wife's questions to their satisfaction.  -Dr. Benay Spice will f/u tomorrow.  Truitt Merle  04/30/2017    Truitt Merle, MD 04/30/2017  11:17 AM

## 2017-05-01 ENCOUNTER — Inpatient Hospital Stay (HOSPITAL_COMMUNITY): Payer: No Typology Code available for payment source

## 2017-05-01 DIAGNOSIS — J9621 Acute and chronic respiratory failure with hypoxia: Secondary | ICD-10-CM

## 2017-05-01 DIAGNOSIS — M069 Rheumatoid arthritis, unspecified: Secondary | ICD-10-CM

## 2017-05-01 DIAGNOSIS — C155 Malignant neoplasm of lower third of esophagus: Secondary | ICD-10-CM

## 2017-05-01 LAB — BASIC METABOLIC PANEL
ANION GAP: 10 (ref 5–15)
BUN: 27 mg/dL — ABNORMAL HIGH (ref 6–20)
CALCIUM: 9.3 mg/dL (ref 8.9–10.3)
CO2: 28 mmol/L (ref 22–32)
Chloride: 103 mmol/L (ref 101–111)
Creatinine, Ser: 0.7 mg/dL (ref 0.61–1.24)
GFR calc non Af Amer: >60 mL/min (ref >60)
Glucose, Bld: 128 mg/dL — ABNORMAL HIGH (ref 65–99)
Potassium: 3.8 mmol/L (ref 3.5–5.1)
Sodium: 141 mmol/L (ref 135–145)

## 2017-05-01 LAB — CBC
HEMATOCRIT: 32 % — AB (ref 39.0–52.0)
Hemoglobin: 10.2 g/dL — ABNORMAL LOW (ref 13.0–17.0)
MCH: 29 pg (ref 26.0–34.0)
MCHC: 31.9 g/dL (ref 30.0–36.0)
MCV: 90.9 fL (ref 78.0–100.0)
Platelets: 306 10*3/uL (ref 150–400)
RBC: 3.52 MIL/uL — AB (ref 4.22–5.81)
RDW: 18.8 % — AB (ref 11.5–15.5)
WBC: 22.7 10*3/uL — AB (ref 4.0–10.5)

## 2017-05-01 LAB — MAGNESIUM: Magnesium: 2.2 mg/dL (ref 1.7–2.4)

## 2017-05-01 LAB — PHOSPHORUS: PHOSPHORUS: 3.2 mg/dL (ref 2.5–4.6)

## 2017-05-01 LAB — BRAIN NATRIURETIC PEPTIDE: B NATRIURETIC PEPTIDE 5: 133 pg/mL — AB (ref 0.0–100.0)

## 2017-05-01 MED ORDER — SODIUM CHLORIDE 0.9 % IV BOLUS
1000.0000 mL | Freq: Once | INTRAVENOUS | Status: AC
  Administered 2017-05-01: 1000 mL via INTRAVENOUS

## 2017-05-01 MED ORDER — METOPROLOL TARTRATE 5 MG/5ML IV SOLN
5.0000 mg | Freq: Once | INTRAVENOUS | Status: DC
  Filled 2017-05-01: qty 5

## 2017-05-01 MED ORDER — POTASSIUM CHLORIDE CRYS ER 20 MEQ PO TBCR
40.0000 meq | EXTENDED_RELEASE_TABLET | Freq: Three times a day (TID) | ORAL | Status: AC
  Administered 2017-05-01 (×2): 40 meq via ORAL
  Filled 2017-05-01 (×2): qty 2

## 2017-05-01 MED ORDER — FUROSEMIDE 10 MG/ML IJ SOLN
40.0000 mg | Freq: Three times a day (TID) | INTRAMUSCULAR | Status: DC
  Administered 2017-05-01: 40 mg via INTRAVENOUS
  Filled 2017-05-01: qty 4

## 2017-05-01 MED ORDER — SODIUM CHLORIDE 0.9 % IV BOLUS
500.0000 mL | Freq: Once | INTRAVENOUS | Status: AC
  Administered 2017-05-01: 500 mL via INTRAVENOUS

## 2017-05-01 MED ORDER — METOPROLOL TARTRATE 5 MG/5ML IV SOLN
2.5000 mg | Freq: Once | INTRAVENOUS | Status: AC
  Administered 2017-05-01: 2.5 mg via INTRAVENOUS
  Filled 2017-05-01: qty 5

## 2017-05-01 MED ORDER — ACETAMINOPHEN 500 MG PO TABS
1000.0000 mg | ORAL_TABLET | Freq: Four times a day (QID) | ORAL | Status: DC | PRN
  Administered 2017-05-01 – 2017-05-05 (×7): 1000 mg via ORAL
  Filled 2017-05-01 (×7): qty 2

## 2017-05-01 NOTE — Progress Notes (Signed)
IP PROGRESS NOTE  Subjective:   Grant Todd was admitted 04/28/2017 with increased respiratory distress.  He continues to require a high amount of supplemental oxygen in order to maintain adequate saturations.  He reports significant improvement in pain.  Objective: Vital signs in last 24 hours: Blood pressure 134/74, pulse 67, temperature 98.3 F (36.8 C), temperature source Axillary, resp. rate (!) 22, height '5\' 8"'$  (1.727 m), weight 162 lb 11.2 oz (73.8 kg), SpO2 96 %.  Intake/Output from previous day: 03/31 0701 - 04/01 0700 In: 1204 [P.O.:494; I.V.:10; IV Piggyback:700] Out: 7510 [Urine:3355]  Physical Exam:  HEENT: No thrush Lungs: Inspiratory rales at the left upper posterior chest, good air movement bilaterally Cardiac: Regular rate and rhythm Abdomen: No hepatosplenomegaly Extremities: Trace lower leg edema bilaterally   Portacath/PICC-without erythema  Lab Results: Recent Labs    04/30/17 0341 05/01/17 0630  WBC 19.7* 22.7*  HGB 9.4* 10.2*  HCT 30.3* 32.0*  PLT 289 306    BMET Recent Labs    04/30/17 0341 05/01/17 0630  NA 141 141  K 3.8 3.8  CL 108 103  CO2 25 28  GLUCOSE 140* 128*  BUN 20 27*  CREATININE 0.54* 0.70  CALCIUM 9.2 9.3    Lab Results  Component Value Date   CEA1 1,045.42 (H) 04/12/2017    Studies/Results: No results found.  Medications: I have reviewed the patient's current medications.  Assessment/Plan: 1. Metastatic esophagus cancer ? Distal esophagus mass at upper endoscopy 12/15/2016, biopsy confirmed invasive poorly differentiated adenocarcinoma ? CTs of 02/16/2016-distal esophagus thickening, mediastinal/right hilar lymphadenopathy, abdominal/retroperitoneal lymphadenopathy, liver metastases ? MRI lumbar spine 12/20/2016-7 mm S1 lesion suspicious for metastasis, bulky retroperitoneal and prevertebral malignant lymphadenopathy, leftpsoasmuscle metastasis, degenerative disease causing severe neural foraminal stenosis at  right L4 and left L5 ? PET scan 12/27/2016-hypermetabolic mass at the gastroesophageal junction; extensive hypermetabolic adenopathy involving the right supraclavicular nodal station, mediastinal lymph nodes, retrocrural lymph nodes, periaortic lymph nodes, left iliac lymph nodes and central mesenteric lymph nodes; fine nodularity in the right upper lobe with hypermetabolic thickening in the right suprahilar peribronchial structures; hypermetabolic metastasis to the right hepatic lobe; several small skeletal metastasis including right iliac bone, left sacrum and left scapula; multiple soft tissue metastases to the subcutaneous tissues adjacent to the left scapular musculature, retroperitoneal nodal metastasis along the left psoas muscle and metastatic lesion within the left gluteal musculature. No lesion in the lumbar spine or spinal canal to explain the patient's acute right-sided leg pain. ? Ultrasound-guided biopsy of a right liver lesion 01/04/2017-poorly differentiated adenocarcinoma ? PD-L1 expression30% ? Cycle 1 FOLFOX 01/05/2017 ? Cycle 2 FOLFOX 01/19/2017 ? Cycle 3 FOLFOX1/03/2017 ? Cycle 4 FOLFOX 02/15/2017 ? Cycle 5 FOLFOX 03/01/2017 ? CTs 03/13/2017 decreased size and number of lymph nodes in the chest, abdomen, and retroperitoneum. Decreased size of largest liver lesion, slight increase in size of small liver lesion, slight increase in left pleural effusion, development of inflammatory appearing airspace disease ? Cycle 6 FOLFOX 03/15/2017 ? Cycle 7 FOLFOX 03/29/2017 ? Cycle 8 FOLFOX 04/17/2017 (Oxaliplatin discontinued) ? CT chest 04/21/2017- progression of thoracic and upper abdominal nodal metastases, masslike opacity in the right upper lobe-progressive with associated lymphangitic tumor spread in the right lung, new right pleural effusion, unchanged left pleural effusion ? Thoracentesis 04/24/2017- metastatic adenocarcinoma ? Cycle 1 pembrolizumab 04/26/2017 2. Painpossibly due to disc  herniation-the pain may be related to retroperitoneal lymphadenopathy or bone metastases;plain x-ray right hip 12/21/2016 with no blastic or lytic bone lesions. No fracture or  dislocation.Review of the lumbar spine MRI shows right L4 disc herniation which could be responsible for the right leg pain and weakness. Trial of dexamethasone initiatedwith improvement.  3.Rheumatoid arthritis  4.Dysphagia/weight loss secondary to #1-improved 02/01/2017.  5.Chronic left pleural effusion  6.Port-A-Cath placement 01/04/2017  7.Early oxaliplatin neuropathy-diminished vibratory sense;mildly decreased to intact 03/29/2017.  8.Right lung inflammatory changes noted on chest CT 03/13/2017;increased cough 04/12/2017. Chest x-ray 04/12/2017 with similar appearance left chronic pleural effusion. Diffuse airspace disease right lung similar to prior chest CT.  Admission 04/28/2017 with progressive respiratory failure  CT chest 04/28/2017-negative for pulmonary embolism, distal esophagus thickening, right upper lobe mass/pleural thickening, lymphatic tumor spread in the right lung with development of interstitial thickening in the left lung, stable left effusion, loculated right effusion, ascites  Grant Todd is admitted with progressive respiratory failure.  There are progressive interstitial infiltrates on CT and chest x-ray.  I suspect the respiratory failure is secondary to lymphangitic tumor spread.  He has been placed on high-dose steroids.  His clinical status is partially improved though he continues to require a high oxygen flow rate.  This is a very difficult situation.  He is less than 1 week out from a first cycle of salvage therapy with pembrolizumab.  I would not expect a clinical response for several more weeks.  Alternate treatment options are limited though we could consider a trial of Taxol/ramucirumab.  He has an incurable malignancy with the chance of clinical improvement  estimated at 30% or less with the above therapies.  It will also be reasonable to proceed with hospice care.  The left pleural effusion is chronic-predates the cancer diagnosis past several years.  I doubt he has bacterial peritonitis.  The ascites is likely malignant.  Recommendations: 1.  Continue steroids, oxygen support 2.   I will continue discussions with Grant Todd and his wife regarding the poor prognosis and option of hospice care.       LOS: 3 days   Betsy Coder, MD   05/01/2017, 7:34 AM

## 2017-05-01 NOTE — Progress Notes (Addendum)
..  Name: Grant Todd [name]: [id number] DOB: [birth year]    ADMISSION DATE:  04/28/2017 CONSULTATION DATE:  04/28/17  REFERRING MD :  Laurann Montana MD  CHIEF COMPLAINT:  SOB\  \BRIEF PATIENT DESCRIPTION: 62 yr old male with metastatic esophageal cancer s/p 8 cycles of FOLCOX and recently started on pembrolizumab on 04/26/17 s/p thoracentesis of right pleural effusion on 04/24/17  Now presents with shortness of breath from pulmonary office and increased HR.   SIGNIFICANT EVENTS  SOB not improved with [place] started on NIPPV  HR 170s started on Cardizem   STUDIES:  CXR shows moderate left effusion and small right loculated effusion with underlying lymphangitic spread and existing metastatic disease. CTA showed no PE.   SUBJECTIVE:  No distress Weaning oxygen   VITAL SIGNS: Temp:  [97.5 F (36.4 C)-98.3 F (36.8 C)] 97.6 F (36.4 C) (04/01 0800) Pulse Rate:  [67-113] 96 (04/01 0800) Resp:  [19-30] 28 (04/01 0800) BP: (113-139)/(74-109) 126/93 (04/01 0800) SpO2:  [89 %-100 %] 92 % (04/01 0800) Weight:  [162 lb 11.2 oz (73.8 kg)] 162 lb 11.2 oz (73.8 kg) (04/01 0354)  Intake/Output Summary (Last 24 hours) at 05/01/2017 0955 Last data filed at 05/01/2017 0600 Gross per 24 hour  Intake 957 ml  Output 3355 ml  Net -2398 ml   Filed Weights   04/29/17 0109 04/30/17 0343 05/01/17 0354  Weight: 165 lb 9.1 oz (75.1 kg) 166 lb 7.2 oz (75.5 kg) 162 lb 11.2 oz (73.8 kg)   PHYSICAL EXAMINATION:  General 62 year old white male. Sitting upright no distress.  HENT:NCAT. No JVD MMM Pulm: decreased left base. No accessory use  Card RRR no MRG Ext + lower extremity edema brisk CR Abd: not tender + bowel sounds Gu: voiding Neuro: intact    Recent Labs  Lab 04/29/17 0300 04/30/17 0341 05/01/17 0630  NA 145 141 141  K 4.1 3.8 3.8  CL 112* 108 103  CO2 24 25 28   BUN 20 20 27*  CREATININE 0.78 0.54* 0.70  GLUCOSE 137* 140* 128*   Recent Labs  Lab 04/29/17 0300 04/30/17 0341  05/01/17 0630  HGB 9.9* 9.4* 10.2*  HCT 31.0* 30.3* 32.0*  WBC 17.5* 19.7* 22.7*  PLT 288 289 306   Dg Chest Port 1 View  Result Date: 05/01/2017 CLINICAL DATA:  Pleural effusion.  History of esophageal carcinoma EXAM: PORTABLE CHEST 1 VIEW COMPARISON:  Chest radiograph and chest CT April 28, 2017 FINDINGS: There is consolidation in the right upper lobe consistent with mass and associated infiltrate. There is airspace opacification in the left upper lobe which appears slightly more prominent compared to recent studies, felt to most likely represent progression of pneumonia or possibly aspiration in the left upper lobe. There are stable pleural effusions bilaterally with bibasilar atelectatic change. Heart is upper normal in size with pulmonary vascularity within normal limits. Port-A-Cath tip is in the superior vena cava near the cavoatrial junction. No pneumothorax. There is adenopathy in the right paratracheal region, better seen on recent CT. IMPRESSION: Increase consolidation left upper lobe consistent with progression of pneumonia. Consolidation with mass right upper lobe persists. Pleural effusions bilaterally appears stable with bibasilar atelectatic change. Stable cardiac prominence. Right paratracheal adenopathy persist, better delineated on recent CT. Electronically Signed   By: Lowella Grip III M.D.   On: 05/01/2017 07:47   ASSESSMENT / PLAN:  metastatic adenocarcinoma of the esophageal cancer (invasive poorly differentiated adenocarcinoma- seen on Nov 2018 in the distal esophagus), known metastatic  liver lesion, lymphadenopathy, retroperitoneal disease, mets to the lung, confirmed metastatic effusion s/p thoracentesis on 04/24/17.  Plan keytruda per onc   Acute Hypoxic Respiratory Failure in setting of Lymphangitic Spread and RUL mass with extension disease to RUL RML c/b possible post-obstructive PNA-->also consider element of pulmonary edema. Loculated Right sided  effusion Interval development of left effusion -S/p Keytruda (Pembrolizumab) on 3/27 Metastatic Pleural Effusions (small loculated right, moderate left) -pct not impressive -RVP negative -pcxr personally reviewed: worsening bilateral aeration specifically increased LUL airspace disease as well as the RUL consolidation.  Plan Cont steroids at 60mg  q6 Day # 4/7 cefepime and vanc Ck BNP and echo Cont lasix as long as BP/BUN and cr allow  Will come by later and eval left chest via [place]. He wants to hold off from pleur-x-->I think that's reasonable as I doubt that the left effusion is responsible for his symptoms  Wean oxygen   Spontaneous Bacterial Peritonitis Interval development of Abdominal ascites  Plan abx as above. Cefepime should cover   Multifocal Atrial Tachycardia secondary to Respiratory status HR stable Trace pericardial effusion on CTA Plan Cont tele monitoring   On-going leukocytosis. Suspect somewhat steroid induced Plan Trend cbc  Anemia of chronic illness Plan Trend cbc   DVT prophylaxis: [place] heparin  SUP: na  Diet: reg Activity: OOB Disposition : sdu  Erick Colace ACNP-BC [place] Pager # [phone] OR # [phone] if no answer         05/01/2017, 9:44 AM

## 2017-05-01 NOTE — Progress Notes (Signed)
PROGRESS NOTE    Grant Todd  EUM:[id number] DOB: Oct 06, 1955 DOA: 04/28/2017 PCP: [name], MD  Brief Narrative:  62 yr old male with metastatic esophageal cancer s/p 8 cycles of [place] and recently started on pembrolizumab on 04/26/17 s/p thoracentesis of right pleural effusion on 04/24/17  presents with sob and tachycardia. He was admitted for acute on chronic respiratory failure with hypoxia from post obstructive pneumonia. He was admitted to PCCm service and transferred to [facility] ON 4/1.   Assessment & Plan:   Active Problems:   Chronic pleural effusion   Sepsis (HCC)   Atrial tachycardia (HCC)   HCAP (healthcare-associated pneumonia)   ACUTE RESPIRATORY failure with hypoxia secondary to health care associated pneumonia, malignant effusion, and progression of the metastatic disease in the lung.  - remain in step down.  esume steroids.  PCCM on board and assisting with management.    Atrial fibrillation with RVR: Probably driven by lung disease and dehydration. Pt has been on lasix and has diuresed about 5 lit since yesterday and appears to be a little dry with borderline bp measurements.  Gently hydrate and start pt on cardizem gtt, BB as needed.  Please call cardiology if rate is not well controlled.     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  Oncology on board.   SBP: On cefepime.    AOCD: Transfuse to keep hemo globin greater than 7.   Worsening leukocytosis:  Infection and steroids. Monitor.     DVT prophylaxis: heparin.  Code Status: DNR Family Communication: WIFE  At bedside.  Disposition Plan:pending resolution of respiratory failure .   Consultants:   PCCM, oncology  Procedures: echocardiogram pending.   Antimicrobials:  Cefepime Vancomycin.   Subjective: No chest pain or palpitations  Objective: Vitals:   05/01/17 0000 05/01/17 0354 05/01/17 0400 05/01/17 0800  BP: (!) 139/109  134/74 (!) 126/93  Pulse:  (!) 113  67 96  Resp: 19  (!) 22 (!) 28  Temp:  98.3 F (36.8 C)  97.6 F (36.4 C)  TempSrc:  Axillary  Oral  SpO2: 93%  96% 92%  Weight:  73.8 kg (162 lb 11.2 oz)    Height:        Intake/Output Summary (Last 24 hours) at 05/01/2017 1142 Last data filed at 05/01/2017 0957 Gross per 24 hour  Intake 960 ml  Output 3355 ml  Net -2395 ml   Filed Weights   04/29/17 0109 04/30/17 0343 05/01/17 0354  Weight: 75.1 kg (165 lb 9.1 oz) 75.5 kg (166 lb 7.2 oz) 73.8 kg (162 lb 11.2 oz)    Examination:  General exam: inmild distress from sob on 8 lit of [place] oxygen.  Respiratory system: tachypnea, diminished at bases. No wheezing heard today.  Cardiovascular system: S1 & S2 heard, tachycardia, irregular.  rubs, gallops or clicks. No pedal edema. Gastrointestinal system: Abdomen is nondistended, soft and nontender. No organomegaly or masses felt. Normal bowel sounds heard. Central nervous system: Alert and oriented. No focal neurological deficits. Extremities: Symmetric 5 x 5 power. Skin: No rashes, lesions or ulcers Psychiatry: Judgement and insight appear normal. Mood & affect appropriate.     Data Reviewed: I have personally reviewed following labs and imaging studies  CBC: Recent Labs  Lab 04/26/17 0757 04/28/17 1808 04/29/17 0300 04/30/17 0341 05/01/17 0630  WBC 18.1* 22.1* 17.5* 19.7* 22.7*  NEUTROABS 14.3*  --   --   --   --   HGB  --  11.3* 9.9* 9.4* 10.2*  HCT 39.3 35.4* 31.0* 30.3* 32.0*  MCV 92.7 91.0 90.9 92.4 90.9  PLT 286 310 288 289 778   Basic Metabolic Panel: Recent Labs  Lab 04/26/17 0757 04/28/17 1808 04/28/17 2228 04/29/17 0300 04/30/17 0341 05/01/17 0630  NA 142 139  --  145 141 141  K 4.1 3.9  --  4.1 3.8 3.8  CL 107 104  --  112* 108 103  CO2 22 24  --  24 25 28   GLUCOSE 133 118*  --  137* 140* 128*  BUN 23 25*  --  20 20 27*  CREATININE 0.81 0.71  --  0.78 0.54* 0.70  CALCIUM 10.1 9.1  --  8.9 9.2 9.3  MG  --   --  1.6* 1.7 1.9 2.2  PHOS  --    --  2.9 3.7 2.9 3.2   GFR: Estimated Creatinine Clearance: 90.3 mL/min (by C-G formula based on SCr of 0.7 mg/dL). Liver Function Tests: Recent Labs  Lab 04/26/17 0757  AST 43*  ALT 43  ALKPHOS 447*  BILITOT 0.5  PROT 6.9  ALBUMIN 2.1*   No results for input(s): LIPASE, AMYLASE in the last 168 hours. No results for input(s): AMMONIA in the last 168 hours. Coagulation Profile: No results for input(s): INR, PROTIME in the last 168 hours. Cardiac Enzymes: No results for input(s): CKTOTAL, CKMB, CKMBINDEX, TROPONINI in the last 168 hours. BNP (last 3 results) No results for input(s): PROBNP in the last 8760 hours. HbA1C: No results for input(s): HGBA1C in the last 72 hours. CBG: No results for input(s): GLUCAP in the last 168 hours. Lipid Profile: No results for input(s): CHOL, HDL, LDLCALC, TRIG, CHOLHDL, LDLDIRECT in the last 72 hours. Thyroid Function Tests: No results for input(s): TSH, T4TOTAL, FREET4, T3FREE, THYROIDAB in the last 72 hours. Anemia Panel: No results for input(s): VITAMINB12, FOLATE, FERRITIN, TIBC, IRON, RETICCTPCT in the last 72 hours. Sepsis Labs: Recent Labs  Lab 04/28/17 2123 04/28/17 2228 04/28/17 2252 04/29/17 0300 04/30/17 0341  PROCALCITON  --  0.88  --  0.86 0.46  LATICACIDVEN 1.95* 2.0* 1.96*  --   --     Recent Results (from the past 240 hour(s))  Blood culture (routine x 2)     Status: None (Preliminary result)   Collection Time: 04/28/17  7:10 PM  Result Value Ref Range Status   Specimen Description   Final    BLOOD LEFT ANTECUBITAL Performed at [facility], [place] [address], [place], [place] [address]    Special Requests   Final    BOTTLES DRAWN AEROBIC AND ANAEROBIC Blood Culture adequate volume Performed at [place] [address], [place], [place] [address]    Culture   Final    NO GROWTH 2 DAYS Performed at [place] [address], [place], [place] [address]    Report  Status PENDING  Incomplete  Blood culture (routine x 2)     Status: None (Preliminary result)   Collection Time: 04/28/17  9:00 PM  Result Value Ref Range Status   Specimen Description   Final    BLOOD RIGHT ANTECUBITAL Performed at [place] [address] [place], [place] [address]    Special Requests   Final    BOTTLES DRAWN AEROBIC AND ANAEROBIC Blood Culture adequate volume Performed at [place] [address], [place],  [address]    Culture   Final  NO GROWTH 1 DAY Performed at [facility], [place] [address] [place], [place] [address]    Report Status PENDING  Incomplete  Respiratory Panel by PCR     Status: None   Collection Time: 04/29/17  1:17 AM  Result Value Ref Range Status   Adenovirus NOT DETECTED NOT DETECTED Final   Coronavirus 229E NOT DETECTED NOT DETECTED Final   Coronavirus HKU1 NOT DETECTED NOT DETECTED Final   Coronavirus NL63 NOT DETECTED NOT DETECTED Final   Coronavirus OC43 NOT DETECTED NOT DETECTED Final   Metapneumovirus NOT DETECTED NOT DETECTED Final   Rhinovirus / Enterovirus NOT DETECTED NOT DETECTED Final   Influenza A NOT DETECTED NOT DETECTED Final   Influenza B NOT DETECTED NOT DETECTED Final   Parainfluenza Virus 1 NOT DETECTED NOT DETECTED Final   Parainfluenza Virus 2 NOT DETECTED NOT DETECTED Final   Parainfluenza Virus 3 NOT DETECTED NOT DETECTED Final   Parainfluenza Virus 4 NOT DETECTED NOT DETECTED Final   Respiratory Syncytial Virus NOT DETECTED NOT DETECTED Final   Bordetella pertussis NOT DETECTED NOT DETECTED Final   Chlamydophila pneumoniae NOT DETECTED NOT DETECTED Final   Mycoplasma pneumoniae NOT DETECTED NOT DETECTED Final    Comment: Performed at [facility], [place] [address] [place], [place] [address]  MRSA PCR Screening     Status: None   Collection Time: 04/29/17  1:17 AM  Result Value Ref Range Status   MRSA by PCR NEGATIVE NEGATIVE Final    Comment:         The GeneXpert MRSA Assay (FDA approved for NASAL specimens only), is one component of a comprehensive MRSA colonization surveillance program. It is not intended to diagnose MRSA infection nor to guide or monitor treatment for MRSA infections. Performed at [facility], [place] [address], [place], [place] [address]          Radiology Studies: Dg Chest Port 1 View  Result Date: 05/01/2017 CLINICAL DATA:  Pleural effusion.  History of esophageal carcinoma EXAM: PORTABLE CHEST 1 VIEW COMPARISON:  Chest radiograph and chest CT April 28, 2017 FINDINGS: There is consolidation in the right upper lobe consistent with mass and associated infiltrate. There is airspace opacification in the left upper lobe which appears slightly more prominent compared to recent studies, felt to most likely represent progression of pneumonia or possibly aspiration in the left upper lobe. There are stable pleural effusions bilaterally with bibasilar atelectatic change. Heart is upper normal in size with pulmonary vascularity within normal limits. Port-A-Cath tip is in the superior vena cava near the cavoatrial junction. No pneumothorax. There is adenopathy in the right paratracheal region, better seen on recent CT. IMPRESSION: Increase consolidation left upper lobe consistent with progression of pneumonia. Consolidation with mass right upper lobe persists. Pleural effusions bilaterally appears stable with bibasilar atelectatic change. Stable cardiac prominence. Right paratracheal adenopathy persist, better delineated on recent CT. Electronically Signed   By: Lowella Grip III M.D.   On: 05/01/2017 07:47        Scheduled Meds: . Chlorhexidine Gluconate Cloth  6 each Topical Daily  . furosemide  40 mg Intravenous Q8H  . heparin  5,000 Units Subcutaneous Q8H  . methylPREDNISolone (SOLU-MEDROL) injection  60 mg Intravenous Q6H  . pantoprazole (PROTONIX) IV  40 mg Intravenous Q24H  . potassium  chloride  40 mEq Oral TID  . sodium chloride flush  10-40 mL Intracatheter Q12H   Continuous Infusions: . sodium chloride    .  ceFEPime (MAXIPIME) IV Stopped (05/01/17 0549)  . diltiazem (CARDIZEM) infusion Stopped (04/29/17 0300)  . sodium chloride Stopped (04/28/17 2150)   And  . sodium chloride    . vancomycin 1,000 mg (05/01/17 0957)     LOS: 3 days    Time spent: 35 minutes.    Hosie Poisson, MD [facility] Pager [phone]   If 7PM-7AM, please contact night-coverage [email or web address] Password [facility] 05/01/2017, 11:42 AM

## 2017-05-01 NOTE — Progress Notes (Signed)
Echo unable to be performed at this time due to patient HR above 160 bpm.

## 2017-05-01 NOTE — Progress Notes (Signed)
Continuing to page hospitalist APP without a response. Saidi Santacroce RN

## 2017-05-01 NOTE — Progress Notes (Signed)
Hospitalist APP paged with message below.   "Regarding ICU 1236. Pt hypotensive 75/57 after 2L bolus. Please call. Navreet Bolda RN"

## 2017-05-02 ENCOUNTER — Inpatient Hospital Stay (HOSPITAL_COMMUNITY): Payer: No Typology Code available for payment source

## 2017-05-02 DIAGNOSIS — R0600 Dyspnea, unspecified: Secondary | ICD-10-CM

## 2017-05-02 DIAGNOSIS — J9600 Acute respiratory failure, unspecified whether with hypoxia or hypercapnia: Secondary | ICD-10-CM

## 2017-05-02 DIAGNOSIS — I4891 Unspecified atrial fibrillation: Secondary | ICD-10-CM

## 2017-05-02 DIAGNOSIS — J90 Pleural effusion, not elsewhere classified: Secondary | ICD-10-CM

## 2017-05-02 LAB — COMPREHENSIVE METABOLIC PANEL
ALBUMIN: 2 g/dL — AB (ref 3.5–5.0)
ALT: 142 U/L — AB (ref 17–63)
ANION GAP: 8 (ref 5–15)
AST: 165 U/L — ABNORMAL HIGH (ref 15–41)
Alkaline Phosphatase: 417 U/L — ABNORMAL HIGH (ref 38–126)
BUN: 27 mg/dL — ABNORMAL HIGH (ref 6–20)
CHLORIDE: 111 mmol/L (ref 101–111)
CO2: 26 mmol/L (ref 22–32)
CREATININE: 0.57 mg/dL — AB (ref 0.61–1.24)
Calcium: 9.2 mg/dL (ref 8.9–10.3)
GFR calc Af Amer: >60 mL/min (ref >60)
GFR calc non Af Amer: >60 mL/min (ref >60)
GLUCOSE: 134 mg/dL — AB (ref 65–99)
Potassium: 4.2 mmol/L (ref 3.5–5.1)
SODIUM: 145 mmol/L (ref 135–145)
Total Bilirubin: 0.6 mg/dL (ref 0.3–1.2)
Total Protein: 6.2 g/dL — ABNORMAL LOW (ref 6.5–8.1)

## 2017-05-02 LAB — CBC
HCT: 33.2 % — ABNORMAL LOW (ref 39.0–52.0)
Hemoglobin: 10.5 g/dL — ABNORMAL LOW (ref 13.0–17.0)
MCH: 28.9 pg (ref 26.0–34.0)
MCHC: 31.6 g/dL (ref 30.0–36.0)
MCV: 91.5 fL (ref 78.0–100.0)
PLATELETS: 313 10*3/uL (ref 150–400)
RBC: 3.63 MIL/uL — AB (ref 4.22–5.81)
RDW: 18.9 % — ABNORMAL HIGH (ref 11.5–15.5)
WBC: 22.2 10*3/uL — ABNORMAL HIGH (ref 4.0–10.5)

## 2017-05-02 LAB — ECHOCARDIOGRAM COMPLETE
Height: 68 in
Weight: 2744.29 oz

## 2017-05-02 MED ORDER — SODIUM CHLORIDE 0.9 % IV BOLUS
1000.0000 mL | Freq: Once | INTRAVENOUS | Status: AC
  Administered 2017-05-02: 1000 mL via INTRAVENOUS

## 2017-05-02 MED ORDER — AMIODARONE HCL IN DEXTROSE 360-4.14 MG/200ML-% IV SOLN
60.0000 mg/h | INTRAVENOUS | Status: DC
  Administered 2017-05-02 (×2): 60 mg/h via INTRAVENOUS
  Filled 2017-05-02: qty 200

## 2017-05-02 MED ORDER — METOPROLOL TARTRATE 5 MG/5ML IV SOLN
2.5000 mg | Freq: Once | INTRAVENOUS | Status: AC
  Administered 2017-05-02: 2.5 mg via INTRAVENOUS
  Filled 2017-05-02: qty 5

## 2017-05-02 MED ORDER — AMIODARONE HCL IN DEXTROSE 360-4.14 MG/200ML-% IV SOLN
30.0000 mg/h | INTRAVENOUS | Status: DC
  Administered 2017-05-02 – 2017-05-03 (×3): 30 mg/h via INTRAVENOUS
  Filled 2017-05-02 (×3): qty 200

## 2017-05-02 MED ORDER — SODIUM CHLORIDE 0.9 % IV BOLUS: 250.0000 mL | Freq: Once | INTRAVENOUS | Status: AC

## 2017-05-02 MED ORDER — SODIUM CHLORIDE 0.9 % IV SOLN
1.0000 g | Freq: Three times a day (TID) | INTRAVENOUS | Status: AC
  Administered 2017-05-02 – 2017-05-04 (×8): 1 g via INTRAVENOUS
  Filled 2017-05-02 (×9): qty 1

## 2017-05-02 MED ORDER — DIGOXIN 0.25 MG/ML IJ SOLN
0.2500 mg | Freq: Once | INTRAMUSCULAR | Status: AC
  Administered 2017-05-02: 0.25 mg via INTRAVENOUS
  Filled 2017-05-02: qty 1

## 2017-05-02 MED ORDER — PANTOPRAZOLE SODIUM 40 MG PO TBEC
40.0000 mg | DELAYED_RELEASE_TABLET | Freq: Every day | ORAL | Status: DC
  Administered 2017-05-02 – 2017-05-05 (×4): 40 mg via ORAL
  Filled 2017-05-02 (×4): qty 1

## 2017-05-02 MED ORDER — AMIODARONE LOAD VIA INFUSION
150.0000 mg | Freq: Once | INTRAVENOUS | Status: AC
  Administered 2017-05-02: 150 mg via INTRAVENOUS
  Filled 2017-05-02: qty 83.34

## 2017-05-02 NOTE — Progress Notes (Signed)
PROGRESS NOTE    Grant Todd  VQQ:[id number] DOB: 1955-12-25 DOA: 04/28/2017 PCP: [name], MD  Brief Narrative:  62 yr old male with metastatic esophageal cancer s/p 8 cycles of [place] and recently started on pembrolizumab on 04/26/17 s/p thoracentesis of right pleural effusion on 04/24/17  presents with sob and tachycardia. He was admitted for acute on chronic respiratory failure with hypoxia from post obstructive pneumonia. He was admitted to PCCm service and transferred to [facility] ON 4/1. Overnight pt has remained in afib with RVR in 150's with minimal response to cardizem . Cardiology consulted this am and we started him on IV amiodarone.    Assessment & Plan:   Active Problems:   Chronic pleural effusion   Sepsis (HCC)   Atrial tachycardia (HCC)   HCAP (healthcare-associated pneumonia)   ACUTE RESPIRATORY failure with hypoxia secondary to health care associated pneumonia, malignant effusion, and progression of the metastatic disease in the lung.  - remain in step down/ICU resume steroids as per pulmonary.  PCCM on board and assisting with management.  Repeat CXR this am shows worsening of the air space disease and worsening pleural effusions.  Patient currently on 8 lit of high flow oxygen, with persistent cough.    Atrial fibrillation with RVR: Probably driven by lung disease and dehydration. Pt has been on lasix and has diuresed about 5 lit and appears to be a little dry with borderline bp measurements.  Over night patient's HR remained between 90's to 150's , maxed out on cardizem gtt, 2 doses of metoprolol and after receiving 5 liters of NS in boluses.  His rate between 120's to 150's this morning , will order IV amiodarone and cardiology consulted for recommendations.  Echocardiogram ordered.    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  Oncology on board.   SBP: On cefepime.    AOCD: Transfuse to keep hemo globin greater than 7.    Hemoglobin stable around 10.   Worsening leukocytosis:  Infection and steroids. Monitor.     DVT prophylaxis: heparin.  Code Status: DNR Family Communication: WIFE  At bedside.  Disposition Plan:pending resolution of respiratory failure and atrial fibrillation.    Consultants:   PCCM,   oncology  Cardiology.    Procedures: echocardiogram pending.   Antimicrobials:  Cefepime Vancomycin.   Subjective: Worse night  , with elevated HR and coughing.   Objective: Vitals:   05/02/17 0200 05/02/17 0300 05/02/17 0313 05/02/17 0400  BP: 103/81 (!) 80/52  (!) 80/60  Pulse: 81 77  79  Resp: (!) 25 19  20   Temp:   (!) 97 F (36.1 C)   TempSrc:   Axillary   SpO2: 95% 99%  97%  Weight:   77.8 kg (171 lb 8.3 oz)   Height:        Intake/Output Summary (Last 24 hours) at 05/02/2017 0804 Last data filed at 05/02/2017 0600 Gross per 24 hour  Intake 1481.38 ml  Output 1356 ml  Net 125.38 ml   Filed Weights   04/30/17 0343 05/01/17 0354 05/02/17 0313  Weight: 75.5 kg (166 lb 7.2 oz) 73.8 kg (162 lb 11.2 oz) 77.8 kg (171 lb 8.3 oz)    Examination:  General exam: in mild distress from sob on 8 lit of [place] oxygen, sitting in the chair.  Respiratory system: tachypnea, diminished at bases. No wheezing heard today.  Cardiovascular system: S1 & S2 heard, tachycardia, irregular.no  rubs, gallops or clicks. No pedal edema.  Gastrointestinal system: Abdomen is soft ,non tender non distended bowel sounds heard. . Central nervous system: Alert and oriented. No focal neurological deficits. Extremities: Symmetric 5 x 5 power. No cyanosis or clubbing.  Skin: No rashes, lesions or ulcers Psychiatry:Mood & affect appropriate.     Data Reviewed: I have personally reviewed following labs and imaging studies  CBC: Recent Labs  Lab 04/26/17 0757 04/28/17 1808 04/29/17 0300 04/30/17 0341 05/01/17 0630 05/02/17 0655  WBC 18.1* 22.1* 17.5* 19.7* 22.7* 22.2*  NEUTROABS 14.3*  --   --    --   --   --   HGB  --  11.3* 9.9* 9.4* 10.2* 10.5*  HCT 39.3 35.4* 31.0* 30.3* 32.0* 33.2*  MCV 92.7 91.0 90.9 92.4 90.9 91.5  PLT 286 310 288 289 306 481   Basic Metabolic Panel: Recent Labs  Lab 04/28/17 1808 04/28/17 2228 04/29/17 0300 04/30/17 0341 05/01/17 0630 05/02/17 0655  NA 139  --  145 141 141 145  K 3.9  --  4.1 3.8 3.8 4.2  CL 104  --  112* 108 103 111  CO2 24  --  24 25 28 26   GLUCOSE 118*  --  137* 140* 128* 134*  BUN 25*  --  20 20 27* 27*  CREATININE 0.71  --  0.78 0.54* 0.70 0.57*  CALCIUM 9.1  --  8.9 9.2 9.3 9.2  MG  --  1.6* 1.7 1.9 2.2  --   PHOS  --  2.9 3.7 2.9 3.2  --    GFR: Estimated Creatinine Clearance: 90.3 mL/min (A) (by C-G formula based on SCr of 0.57 mg/dL (L)). Liver Function Tests: Recent Labs  Lab 04/26/17 0757 05/02/17 0655  AST 43* 165*  ALT 43 142*  ALKPHOS 447* 417*  BILITOT 0.5 0.6  PROT 6.9 6.2*  ALBUMIN 2.1* 2.0*   No results for input(s): LIPASE, AMYLASE in the last 168 hours. No results for input(s): AMMONIA in the last 168 hours. Coagulation Profile: No results for input(s): INR, PROTIME in the last 168 hours. Cardiac Enzymes: No results for input(s): CKTOTAL, CKMB, CKMBINDEX, TROPONINI in the last 168 hours. BNP (last 3 results) No results for input(s): PROBNP in the last 8760 hours. HbA1C: No results for input(s): HGBA1C in the last 72 hours. CBG: No results for input(s): GLUCAP in the last 168 hours. Lipid Profile: No results for input(s): CHOL, HDL, LDLCALC, TRIG, CHOLHDL, LDLDIRECT in the last 72 hours. Thyroid Function Tests: No results for input(s): TSH, T4TOTAL, FREET4, T3FREE, THYROIDAB in the last 72 hours. Anemia Panel: No results for input(s): VITAMINB12, FOLATE, FERRITIN, TIBC, IRON, RETICCTPCT in the last 72 hours. Sepsis Labs: Recent Labs  Lab 04/28/17 2123 04/28/17 2228 04/28/17 2252 04/29/17 0300 04/30/17 0341  PROCALCITON  --  0.88  --  0.86 0.46  LATICACIDVEN 1.95* 2.0* 1.96*  --   --      Recent Results (from the past 240 hour(s))  Blood culture (routine x 2)     Status: None (Preliminary result)   Collection Time: 04/28/17  7:10 PM  Result Value Ref Range Status   Specimen Description   Final    BLOOD LEFT ANTECUBITAL Performed at [facility], [place] [address], [place], [place] [address]    Special Requests   Final    BOTTLES DRAWN AEROBIC AND ANAEROBIC Blood Culture adequate volume Performed at [place] [address], [place], [place] [address]    Culture   Final  NO GROWTH 3 DAYS Performed at [facility], [place] [address] [place], [place] [address]    Report Status PENDING  Incomplete  Blood culture (routine x 2)     Status: None (Preliminary result)   Collection Time: 04/28/17  9:00 PM  Result Value Ref Range Status   Specimen Description   Final    BLOOD RIGHT ANTECUBITAL Performed at [place] [address] [place], [place] [address]    Special Requests   Final    BOTTLES DRAWN AEROBIC AND ANAEROBIC Blood Culture adequate volume Performed at [place] [address] [place], [place] [address]    Culture   Final    NO GROWTH 2 DAYS Performed at [facility] [address] [place], [place] [address]    Report Status PENDING  Incomplete  Respiratory Panel by PCR     Status: None   Collection Time: 04/29/17  1:17 AM  Result Value Ref Range Status   Adenovirus NOT DETECTED NOT DETECTED Final   Coronavirus 229E NOT DETECTED NOT DETECTED Final   Coronavirus HKU1 NOT DETECTED NOT DETECTED Final   Coronavirus NL63 NOT DETECTED NOT DETECTED Final   Coronavirus OC43 NOT DETECTED NOT DETECTED Final   Metapneumovirus NOT DETECTED NOT DETECTED Final   Rhinovirus / Enterovirus NOT DETECTED NOT DETECTED Final   Influenza A NOT DETECTED NOT DETECTED Final   Influenza B NOT DETECTED NOT DETECTED Final   Parainfluenza Virus 1 NOT DETECTED NOT DETECTED  Final   Parainfluenza Virus 2 NOT DETECTED NOT DETECTED Final   Parainfluenza Virus 3 NOT DETECTED NOT DETECTED Final   Parainfluenza Virus 4 NOT DETECTED NOT DETECTED Final   Respiratory Syncytial Virus NOT DETECTED NOT DETECTED Final   Bordetella pertussis NOT DETECTED NOT DETECTED Final   Chlamydophila pneumoniae NOT DETECTED NOT DETECTED Final   Mycoplasma pneumoniae NOT DETECTED NOT DETECTED Final    Comment: Performed at [facility], [place] [address] [place], [place] [address]  MRSA PCR Screening     Status: None   Collection Time: 04/29/17  1:17 AM  Result Value Ref Range Status   MRSA by PCR NEGATIVE NEGATIVE Final    Comment:        The GeneXpert MRSA Assay (FDA approved for NASAL specimens only), is one component of a comprehensive MRSA colonization surveillance program. It is not intended to diagnose MRSA infection nor to guide or monitor treatment for MRSA infections. Performed at [facility], [place] [address] [place],  [address]          Radiology Studies: Dg Chest Port 1 View  Result Date: 05/02/2017 CLINICAL DATA:  Followup pulmonary edema. EXAM: PORTABLE CHEST 1 VIEW COMPARISON:  05/01/2017 FINDINGS: Power port on the right is unchanged with tip at the SVC RA junction. Small pleural effusions persist. Abnormal bilateral lung density which could be due to a combination of edema, collapse and pneumonia persists. This is worse on the right the left. Findings show slight worsening over time. IMPRESSION: Slow worsening bilateral effusions, atelectasis and areas airspace filling right worse left. Electronically Signed   By: Nelson Chimes M.D.   On: 05/02/2017 07:02   Dg Chest Port 1 View  Result Date: 05/01/2017 CLINICAL DATA:  Pleural effusion.  History of esophageal carcinoma EXAM: PORTABLE CHEST 1 VIEW COMPARISON:  Chest radiograph and chest CT April 28, 2017 FINDINGS: There is consolidation in the right upper lobe consistent with mass  and associated infiltrate. There is airspace opacification in the left upper lobe which appears slightly more prominent compared to recent studies, felt to most likely represent progression of pneumonia or possibly aspiration in the left upper lobe. There are stable pleural effusions bilaterally with bibasilar atelectatic change. Heart is upper normal in size with pulmonary vascularity within normal limits. Port-A-Cath tip is in the superior vena cava near the cavoatrial junction. No pneumothorax. There is adenopathy in the right paratracheal region, better seen on recent CT. IMPRESSION: Increase consolidation left upper lobe consistent with progression of pneumonia. Consolidation with mass right upper lobe persists. Pleural effusions bilaterally appears stable with bibasilar atelectatic change. Stable cardiac prominence. Right paratracheal adenopathy persist, better delineated on recent CT. Electronically Signed   By: Lowella Grip III M.D.   On: 05/01/2017 07:47        Scheduled Meds: . Chlorhexidine Gluconate Cloth  6 each Topical Daily  . heparin  5,000 Units Subcutaneous Q8H  . methylPREDNISolone (SOLU-MEDROL) injection  60 mg Intravenous Q6H  . metoprolol tartrate  5 mg Intravenous Once  . pantoprazole (PROTONIX) IV  40 mg Intravenous Q24H  . sodium chloride flush  10-40 mL Intracatheter Q12H   Continuous Infusions: . sodium chloride    . ceFEPime (MAXIPIME) IV Stopped (05/02/17 0539)  . diltiazem (CARDIZEM) infusion 15 mg/hr (05/02/17 0227)  . sodium chloride Stopped (04/28/17 2150)   And  . sodium chloride    . vancomycin Stopped (05/02/17 0041)     LOS: 4 days    Time spent: 35 minutes.    Hosie Poisson, MD [facility] Pager [phone]   If 7PM-7AM, please contact night-coverage [email or web address] Password [email or web address] 05/02/2017, 8:04 AM

## 2017-05-02 NOTE — Progress Notes (Signed)
Pharmacy Antibiotic Note  Grant Todd is a 62 y.o. male with hx metastatic esophageal cancer on pembrolizumab, admitted to the ED on 04/28/2017 with SOB.  Vancomycin and cefepime started on admission for sepsis (? SBP, PNA).  Plan: - Adjust cefepime to 1gm IV q8h - plan is 7-day course per PCCM note  _______________________________________________  Height: 5\' 8"  (172.7 cm) Weight: 171 lb 8.3 oz (77.8 kg) IBW/kg (Calculated) : 68.4  Temp (24hrs), Avg:97.7 F (36.5 C), Min:97 F (36.1 C), Max:98.3 F (36.8 C)  Recent Labs  Lab 04/28/17 1808 04/28/17 2123 04/28/17 2228 04/28/17 2252 04/29/17 0300 04/30/17 0341 05/01/17 0630 05/02/17 0655  WBC 22.1*  --   --   --  17.5* 19.7* 22.7* 22.2*  CREATININE 0.71  --   --   --  0.78 0.54* 0.70 0.57*  LATICACIDVEN  --  1.95* 2.0* 1.96*  --   --   --   --     Estimated Creatinine Clearance: 90.3 mL/min (A) (by C-G formula based on SCr of 0.57 mg/dL (L)).    Allergies  Allergen Reactions  . Chantix [Varenicline] Other (See Comments)    Vivid dreams and lethargic  . Methotrexate Derivatives     Caused pleural effusions  . Shellfish Allergy Hives  . Sulfonamide Derivatives Rash    Antimicrobials this admission:  3/29 Vanc >> 4/2 3/29 Cefepime  >>   Dose adjustments this admission:   Microbiology results:  3/29 BCx x2: NGTD 3/30 MRSA PCR: neg 3/30 resp panel pcr: neg  Thank you for allowing pharmacy to be a part of this patient's care.  Doreene Eland, PharmD, BCPS.   Pager: [phone] 05/02/2017 11:04 AM

## 2017-05-02 NOTE — Progress Notes (Addendum)
  Echocardiogram 2D Echocardiogram has been performed.  Spoke to nurse. Attempted echo due to patient HR unstable and unable to remain under 120 bpm.   Jammy Plotkin L Androw 05/02/2017, 9:23 AM

## 2017-05-02 NOTE — Progress Notes (Signed)
Hospitalist APP paged to notify of hypotension. with the following message.   "Pt in 1236 current bp 70/52 Naiyah Klostermann RN"

## 2017-05-02 NOTE — Progress Notes (Signed)
IP PROGRESS NOTE  Subjective:   Mr. Merrick reports improvement in pain.  He has a cough.  He continues to require a high oxygen flow rate.  Objective: Vital signs in last 24 hours: Blood pressure 107/65, pulse (!) 34, temperature (!) 97.5 F (36.4 C), temperature source Oral, resp. rate (!) 31, height _0  (1.727 m), weight 171 lb 8.3 oz (77.8 kg), SpO2 92 %.  Intake/Output from previous day: 04/01 0701 - 04/02 0700 In: 1721.4 [P.O.:800; I.V.:221.4; IV Piggyback:700] Out: 9574 [Urine:1353; Stool:3]  Physical Exam:  HEENT: No thrush Lungs: Inspiratory rub throughout the left posterior chest Cardiac: Irregular, tachycardia Abdomen: Nontender Extremities: Trace lower leg edema bilaterally   Portacath/PICC-without erythema  Lab Results: Recent Labs    05/01/17 0630 05/02/17 0655  WBC 22.7* 22.2*  HGB 10.2* 10.5*  HCT 32.0* 33.2*  PLT 306 313    BMET Recent Labs    05/01/17 0630 05/02/17 0655  NA 141 145  K 3.8 4.2  CL 103 111  CO2 28 26  GLUCOSE 128* 134*  BUN 27* 27*  CREATININE 0.70 0.57*  CALCIUM 9.3 9.2    Lab Results  Component Value Date   CEA1 1,045.42 (H) 04/12/2017    Studies/Results: Dg Chest Port 1 View  Result Date: 05/02/2017 CLINICAL DATA:  Followup pulmonary edema. EXAM: PORTABLE CHEST 1 VIEW COMPARISON:  05/01/2017 FINDINGS: Power port on the right is unchanged with tip at the SVC RA junction. Small pleural effusions persist. Abnormal bilateral lung density which could be due to a combination of edema, collapse and pneumonia persists. This is worse on the right the left. Findings show slight worsening over time. IMPRESSION: Slow worsening bilateral effusions, atelectasis and areas airspace filling right worse left. Electronically Signed   By: Nelson Chimes M.D.   On: 05/02/2017 07:02   Dg Chest Port 1 View  Result Date: 05/01/2017 CLINICAL DATA:  Pleural effusion.  History of esophageal carcinoma EXAM: PORTABLE CHEST 1 VIEW COMPARISON:  Chest  radiograph and chest CT April 28, 2017 FINDINGS: There is consolidation in the right upper lobe consistent with mass and associated infiltrate. There is airspace opacification in the left upper lobe which appears slightly more prominent compared to recent studies, felt to most likely represent progression of pneumonia or possibly aspiration in the left upper lobe. There are stable pleural effusions bilaterally with bibasilar atelectatic change. Heart is upper normal in size with pulmonary vascularity within normal limits. Port-A-Cath tip is in the superior vena cava near the cavoatrial junction. No pneumothorax. There is adenopathy in the right paratracheal region, better seen on recent CT. IMPRESSION: Increase consolidation left upper lobe consistent with progression of pneumonia. Consolidation with mass right upper lobe persists. Pleural effusions bilaterally appears stable with bibasilar atelectatic change. Stable cardiac prominence. Right paratracheal adenopathy persist, better delineated on recent CT. Electronically Signed   By: Lowella Grip III M.D.   On: 05/01/2017 07:47    Medications: I have reviewed the patient's current medications.  Assessment/Plan: 1. Metastatic esophagus cancer ? Distal esophagus mass at upper endoscopy 12/15/2016, biopsy confirmed invasive poorly differentiated adenocarcinoma ? CTs of 02/16/2016-distal esophagus thickening, mediastinal/right hilar lymphadenopathy, abdominal/retroperitoneal lymphadenopathy, liver metastases ? MRI lumbar spine 12/20/2016-7 mm S1 lesion suspicious for metastasis, bulky retroperitoneal and prevertebral malignant lymphadenopathy, leftpsoasmuscle metastasis, degenerative disease causing severe neural foraminal stenosis at right L4 and left L5 ? PET scan 12/27/2016-hypermetabolic mass at the gastroesophageal junction; extensive hypermetabolic adenopathy involving the right supraclavicular nodal station, mediastinal lymph nodes, retrocrural  lymph nodes, periaortic lymph nodes, left iliac lymph nodes and central mesenteric lymph nodes; fine nodularity in the right upper lobe with hypermetabolic thickening in the right suprahilar peribronchial structures; hypermetabolic metastasis to the right hepatic lobe; several small skeletal metastasis including right iliac bone, left sacrum and left scapula; multiple soft tissue metastases to the subcutaneous tissues adjacent to the left scapular musculature, retroperitoneal nodal metastasis along the left psoas muscle and metastatic lesion within the left gluteal musculature. No lesion in the lumbar spine or spinal canal to explain the patient's acute right-sided leg pain. ? Ultrasound-guided biopsy of a right liver lesion 01/04/2017-poorly differentiated adenocarcinoma ? PD-L1 expression30% ? Cycle 1 FOLFOX 01/05/2017 ? Cycle 2 FOLFOX 01/19/2017 ? Cycle 3 FOLFOX1/03/2017 ? Cycle 4 FOLFOX 02/15/2017 ? Cycle 5 FOLFOX 03/01/2017 ? CTs 03/13/2017 decreased size and number of lymph nodes in the chest, abdomen, and retroperitoneum. Decreased size of largest liver lesion, slight increase in size of small liver lesion, slight increase in left pleural effusion, development of inflammatory appearing airspace disease ? Cycle 6 FOLFOX 03/15/2017 ? Cycle 7 FOLFOX 03/29/2017 ? Cycle 8 FOLFOX 04/17/2017 (Oxaliplatin discontinued) ? CT chest 04/21/2017- progression of thoracic and upper abdominal nodal metastases, masslike opacity in the right upper lobe-progressive with associated lymphangitic tumor spread in the right lung, new right pleural effusion, unchanged left pleural effusion ? Thoracentesis 04/24/2017- metastatic adenocarcinoma ? Cycle 1 pembrolizumab 04/26/2017 2. Painpossibly due to disc herniation-the pain may be related to retroperitoneal lymphadenopathy or bone metastases;plain x-ray right hip 12/21/2016 with no blastic or lytic bone lesions. No fracture or dislocation.Review of the lumbar spine MRI  shows right L4 disc herniation which could be responsible for the right leg pain and weakness. Trial of dexamethasone initiatedwith improvement.  3.Rheumatoid arthritis  4.Dysphagia/weight loss secondary to #1-improved 02/01/2017.  5.Chronic left pleural effusion  6.Port-A-Cath placement 01/04/2017  7.Early oxaliplatin neuropathy-diminished vibratory sense;mildly decreased to intact 03/29/2017.  8.Right lung inflammatory changes noted on chest CT 03/13/2017;increased cough 04/12/2017. Chest x-ray 04/12/2017 with similar appearance left chronic pleural effusion. Diffuse airspace disease right lung similar to prior chest CT.  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  Atrial fibrillation  Mr. Kauk appears unchanged.  He has persistent hypoxia requiring a high oxygen flow rate.  I suspect the hypoxia is mainly secondary to lymphatic tumor spread in the lungs.  The prognosis is poor.  It is too early to expect a response from the PD1 inhibitor and steroids may lessen the chance of immunotherapy being effective.  We can consider initiating treatment with a taxanes if his cardiopulmonary status improves over the next few days.  I will continue discussions with Mr. Milewski and his wife regarding the prognosis and hospice care.  Recommendations: 1.  Management of respiratory failure per pulmonary medicine 2.  Cardiology consultation for treatment of rapid atrial fibrillation 3.  Continue discussions regarding goals of care       LOS: 4 days   Betsy Coder, MD   05/02/2017, 9:22 AM

## 2017-05-02 NOTE — Consult Note (Addendum)
Cardiology Consultation:   Patient ID: Grant Todd; [id number]; 1955-07-03   Admit date: 04/28/2017 Date of Consult: 05/02/2017  Primary Care Provider: [name], MD Primary Cardiologist: New to [facility] (Dr. Sallyanne Kuster)   Patient Profile:   Grant Todd is a 62 y.o. male with complex PMH of metastatic esophageal cancer s/p 8 cycles of FOLCOX s/p thoracentesis of malignant right pleural effusion on 04/24/17 for lymphagitic spread to lung requiring high flow oxygen and recently started on pembrolizumab on 04/26/17 who is being seen today for the evaluation of Afib RVR  at the request of Dr. Karleen Hampshire.   No prior cardiac hx. No family hx of CAD or MI. No premature death in family.   History of Present Illness:   Grant Todd presented 04/28/17 for worsening dyspnea with hypoxia from pulmonary office requiring BIPAP. CTA of chest negative for PE. Treated with broad spectrum abx for possible pneumonia. Suspected the hypoxia is mainly secondary to lymphatic tumor spread in the lungs. Per oncology, may consider tx with Taxanes if cardiopulmonary status improves. He has poor prognosis and plan to discuss hospice in near future.   In regards to his tachycardia, he was in sinus tachy on arrival and treated with IV cardizem with improved rate and eventually discontinued on 3/30. He was not on any rate controlling agent on 3/31 (rate stable at 90s on sinus). However, he went into afib rvr @ rate of 100-160s 05/01/17 around 1400. IV cardizem restarted with one dose of IV metoprolol 2.5mg . He became hypotensive but HR did not improved. He denies any palpitations, CP, dizziness or syncope. His main complain is dyspnea and requiring high oxygen. He is on SQ heparin. IV amiodarone started by primary service. Echo done today, pending reading.   Past Medical History:  Diagnosis Date  . Elevated LFTs   . Esophageal mass   . Pleural effusion on left   . Posterior tibial tendon dysfunction, right   . Rheumatoid  arthritis ([place])   . Seasonal allergic rhinitis   . Severe esophageal dysplasia   . Tobacco use     Past Surgical History:  Procedure Laterality Date  . CHOLECYSTECTOMY    . IR FLUORO GUIDE PORT INSERTION RIGHT  01/04/2017  . IR US GUIDE BX ASP/DRAIN  01/04/2017  . IR US GUIDE VASC ACCESS RIGHT  01/04/2017  . TONSILLECTOMY       Inpatient Medications: Scheduled Meds: . Chlorhexidine Gluconate Cloth  6 each Topical Daily  . heparin  5,000 Units Subcutaneous Q8H  . methylPREDNISolone (SOLU-MEDROL) injection  60 mg Intravenous Q6H  . pantoprazole  40 mg Oral Daily  . sodium chloride flush  10-40 mL Intracatheter Q12H   Continuous Infusions: . sodium chloride    . amiodarone 60 mg/hr (05/02/17 0949)   Followed by  . amiodarone    . ceFEPime (MAXIPIME) IV Stopped (05/02/17 0539)  . diltiazem (CARDIZEM) infusion 15 mg/hr (05/02/17 0834)  . sodium chloride Stopped (04/28/17 2150)   And  . sodium chloride    . vancomycin 1,000 mg (05/02/17 0939)   PRN Meds: sodium chloride, acetaminophen, sodium chloride flush  Allergies:    Allergies  Allergen Reactions  . Chantix [Varenicline] Other (See Comments)    Vivid dreams and lethargic  . Methotrexate Derivatives     Caused pleural effusions  . Shellfish Allergy Hives  . Sulfonamide Derivatives Rash    Social History:   Social History   Socioeconomic History  . Marital status: Married  Spouse name: Not on file  . Number of children: Not on file  . Years of education: Not on file  . Highest education level: Not on file  Occupational History  . Occupation: Diplomatic Services operational officer  Social Needs  . Financial resource strain: Not on file  . Food insecurity:    Worry: Not on file    Inability: Not on file  . Transportation needs:    Medical: Not on file    Non-medical: Not on file  Tobacco Use  . Smoking status: Former Smoker    Types: Cigarettes    Last attempt to quit: 03/03/2001    Years since quitting: 16.1  . Smokeless  tobacco: Never Used  Substance and Sexual Activity  . Alcohol use: Yes    Comment: social  . Drug use: No  . Sexual activity: Yes    Partners: Female    Comment: married  Lifestyle  . Physical activity:    Days per week: Not on file    Minutes per session: Not on file  . Stress: Not on file  Relationships  . Social connections:    Talks on phone: Not on file    Gets together: Not on file    Attends religious service: Not on file    Active member of club or organization: Not on file    Attends meetings of clubs or organizations: Not on file    Relationship status: Not on file  . Intimate partner violence:    Fear of current or ex partner: Not on file    Emotionally abused: Not on file    Physically abused: Not on file    Forced sexual activity: Not on file  Other Topics Concern  . Not on file  Social History Narrative  . Not on file    Family History:   Family History  Problem Relation Age of Onset  . Uterine cancer Mother   . Hyperlipidemia Father   . CVA Father   . Head & neck cancer Father   . Testicular cancer Son      ROS:  Please see the history of present illness.  ROS  All other ROS reviewed and negative.     Physical Exam/Data:   Vitals:   05/02/17 0800 05/02/17 0825 05/02/17 0830 05/02/17 0834  BP:   107/65   Pulse:  67 79 (!) 34  Resp:  (!) 36 (!) 26 (!) 31  Temp: (!) 97.5 F (36.4 C)     TempSrc: Oral     SpO2:  95% 96% 92%  Weight:      Height:        Intake/Output Summary (Last 24 hours) at 05/02/2017 0957 Last data filed at 05/02/2017 0834 Gross per 24 hour  Intake 1749.88 ml  Output 1606 ml  Net 143.88 ml   Filed Weights   04/30/17 0343 05/01/17 0354 05/02/17 0313  Weight: 166 lb 7.2 oz (75.5 kg) 162 lb 11.2 oz (73.8 kg) 171 lb 8.3 oz (77.8 kg)   Body mass index is 26.08 kg/m.  General:  Thin fraile ill appearing male in siting in recliner. On supplemental oxygen  HEENT: normal Lymph: no adenopathy Neck: no JVD Endocrine:  No  thryomegaly Vascular: No carotid bruits; FA pulses 2+ bilaterally without bruits  Cardiac:  normal S1, S2; irregularly irregular tachycardiac; no murmur Lungs: Diminished breath sound at bases bilaterally with faint rales  abd: soft, nontender, no hepatomegaly  Ext: 1+ edema bilaterally Musculoskeletal:  No deformities, BUE and  BLE strength normal and equal Skin: warm and dry  Neuro:  CNs 2-12 intact, no focal abnormalities noted Psych:  Normal affect   EKG:  The EKG was personally reviewed and demonstrates: Sinus rhythm at rate of 90s with nonspecific T wave inversion inferiorly Telemetry:  Telemetry was personally reviewed and demonstrates: Atrial fibrillation at rate of 110-150  Relevant CV Studies: Pending reading of echocardiogram  Laboratory Data:  Chemistry Recent Labs  Lab 04/30/17 0341 05/01/17 0630 05/02/17 0655  NA 141 141 145  K 3.8 3.8 4.2  CL 108 103 111  CO2 25 28 26   GLUCOSE 140* 128* 134*  BUN 20 27* 27*  CREATININE 0.54* 0.70 0.57*  CALCIUM 9.2 9.3 9.2  GFRNONAA >60 >60 >60  GFRAA >60 >60 >60  ANIONGAP 8 10 8     Recent Labs  Lab 04/26/17 0757 05/02/17 0655  PROT 6.9 6.2*  ALBUMIN 2.1* 2.0*  AST 43* 165*  ALT 43 142*  ALKPHOS 447* 417*  BILITOT 0.5 0.6   Hematology Recent Labs  Lab 04/30/17 0341 05/01/17 0630 05/02/17 0655  WBC 19.7* 22.7* 22.2*  RBC 3.28* 3.52* 3.63*  HGB 9.4* 10.2* 10.5*  HCT 30.3* 32.0* 33.2*  MCV 92.4 90.9 91.5  MCH 28.7 29.0 28.9  MCHC 31.0 31.9 31.6  RDW 19.1* 18.8* 18.9*  PLT 289 306 313   Cardiac EnzymesNo results for input(s): TROPONINI in the last 168 hours.  Recent Labs  Lab 04/28/17 1812  TROPIPOC 0.02    BNP Recent Labs  Lab 04/28/17 1808 05/01/17 0630  BNP 193.8* 133.0*    DDimer No results for input(s): DDIMER in the last 168 hours.  Radiology/Studies:  Ct Angio Chest Pe W And/or Wo Contrast  Result Date: 04/28/2017 CLINICAL DATA:  Esophageal cancer. Ongoing chemotherapy. Patient  complains of shortness breath. EXAM: CT ANGIOGRAPHY CHEST WITH CONTRAST TECHNIQUE: Multidetector CT imaging of the chest was performed using the standard protocol during bolus administration of intravenous contrast. Multiplanar CT image reconstructions and MIPs were obtained to evaluate the vascular anatomy. CONTRAST:  130mL ISOVUE-370 IOPAMIDOL (ISOVUE-370) INJECTION 76% COMPARISON:  04/21/2017 FINDINGS: Cardiovascular: Satisfactory opacification of the pulmonary arteries to the segmental level. No evidence of pulmonary embolism. Mildly enlarged heart. Tiny pericardial effusion. Mild coronary artery disease. Central venous catheter terminates in the distal superior vena cava. Mediastinum/Nodes: Enlarged bilateral mediastinal lymph nodes are not significantly changed with the largest node in right pretracheal location measuring 1.9 cm. Again seen is circumferential thickening of the distal esophagus just above the GE junction. The thyroid is grossly unremarkable. Lungs/Pleura: Right upper lobe pulmonary mass appears more fragmented and measures approximately 4.4 x 5.1 cm. There is subpleural soft tissue thickening extending inferiorly along the anterior pleura of the right upper lobe and right middle lobe, likely representing extension of disease. There is persistent interstitial thickening in the right upper lobe and right middle lobe suggestive of lymphangitic spread of disease. Interval development of interstitial thickening in the left lung, with upper lobe predominance. Stable moderate in size left pleural effusion. The right pleural effusion demonstrates loculations. Upper Abdomen: Interval development of water density pleural effusion, moderate. Musculoskeletal: No chest wall abnormality. No acute or significant osseous findings. Review of the MIP images confirms the above findings. IMPRESSION: No evidence of pulmonary embolus. Previously demonstrated right upper lobe malignancy appears more fragmented and  measures slightly smaller at 5.1 cm in greatest dimension. Progression of lymphangitic spread of disease in the right upper and right middle lobes. Interval development of interstitial  thickening throughout the left lung with upper lobe predominance. This may represent contralateral lymphangitic spread of malignancy versus development of pulmonary edema. Interval development of loculations within the known right pleural effusion. Stable moderate in size left pleural effusion. Interval development of abdominal ascites. Stable thickening of the distal esophagus. Electronically Signed   By: Fidela Salisbury M.D.   On: 04/28/2017 19:59   Dg Chest Port 1 View  Result Date: 05/02/2017 CLINICAL DATA:  Followup pulmonary edema. EXAM: PORTABLE CHEST 1 VIEW COMPARISON:  05/01/2017 FINDINGS: Power port on the right is unchanged with tip at the SVC RA junction. Small pleural effusions persist. Abnormal bilateral lung density which could be due to a combination of edema, collapse and pneumonia persists. This is worse on the right the left. Findings show slight worsening over time. IMPRESSION: Slow worsening bilateral effusions, atelectasis and areas airspace filling right worse left. Electronically Signed   By: Nelson Chimes M.D.   On: 05/02/2017 07:02   Dg Chest Port 1 View  Result Date: 05/01/2017 CLINICAL DATA:  Pleural effusion.  History of esophageal carcinoma EXAM: PORTABLE CHEST 1 VIEW COMPARISON:  Chest radiograph and chest CT April 28, 2017 FINDINGS: There is consolidation in the right upper lobe consistent with mass and associated infiltrate. There is airspace opacification in the left upper lobe which appears slightly more prominent compared to recent studies, felt to most likely represent progression of pneumonia or possibly aspiration in the left upper lobe. There are stable pleural effusions bilaterally with bibasilar atelectatic change. Heart is upper normal in size with pulmonary vascularity within normal  limits. Port-A-Cath tip is in the superior vena cava near the cavoatrial junction. No pneumothorax. There is adenopathy in the right paratracheal region, better seen on recent CT. IMPRESSION: Increase consolidation left upper lobe consistent with progression of pneumonia. Consolidation with mass right upper lobe persists. Pleural effusions bilaterally appears stable with bibasilar atelectatic change. Stable cardiac prominence. Right paratracheal adenopathy persist, better delineated on recent CT. Electronically Signed   By: Lowella Grip III M.D.   On: 05/01/2017 07:47   Dg Chest Port 1 View  Result Date: 04/28/2017 CLINICAL DATA:  Shortness of breath EXAM: PORTABLE CHEST 1 VIEW COMPARISON:  04/24/2017, CT chest 04/21/2017 FINDINGS: Right-sided central venous port tip over the SVC. Small left greater than right pleural effusion, similar on the left slight increase on the right. Progressive consolidation in the right upper lung. Stable cardiomediastinal silhouette. Vascular congestion. Diffuse interstitial opacity as before. IMPRESSION: 1. Similar appearance of left pleural effusion. Slight increased small right pleural effusion 2. Worsening consolidation in the right mid to upper lung which may reflect combination of mass and adjacent pneumonia or inflammatory process 3. Similar appearance of diffuse interstitial disease as compared with recent priors. Electronically Signed   By: Donavan Foil M.D.   On: 04/28/2017 18:28    Assessment and Plan:   1. New onset atrial fibrillation with rapid ventricular rate Patient was in sinus tachycardic on presentation.  His rate improved on IV Cardizem and later discontinued.  Patient is in A. fib RVR since yesterday afternoon.  Rate did not improve on IV Cardizem and he became hypotensive.  IV amiodarone started by primary service.  This would not be a long-term choice given metastatic cancer to lung and elevated LFTs.  Continue for now.  Patient denies any  palpitation or chest pain.  No history of syncope.  His dyspnea likely driven by underlying lung issue. CHADSVASC score of 1 for age.  2  Metastatic esophagus cancer - Poor prognosis   Dr. Sallyanne Kuster to see later today.   For questions or updates, please contact [place] Please consult [email or web address] for contact info under Cardiology/STEMI.   Jarrett Soho, PA  05/02/2017 9:57 AM   I have seen and examined the patient along with Leanor Kail, PA .  I have reviewed the chart, notes and new data.  I agree with PA's note.  Key new complaints: cough and dyspnea are dominant problems - he is unaware of any palpitations. Key examination changes: irregular rapid rhythm, otherwise normal CV exam Key new findings / data: echo performed today reviewed  - Left ventricle: The cavity size was normal. Systolic function was   normal. The estimated ejection fraction was in the range of 60%   to 65%. Wall motion was normal; there were no regional wall   motion abnormalities. The study is not technically sufficient to   allow evaluation of LV diastolic function. - Atrial septum: No defect or patent foramen ovale was identified. - Pulmonary arteries: PA peak pressure: 39 mm Hg (S).   PLAN: The severity of the RVR will vary in parallel with his respiratory failure. Unfortunately BP precludes use of effective doses of diltiazem or metoprolol. Amiodarone is best option for rate +/- rhythm control. Could add digoxin if necessary, but expect mediocre results. Prognosis is grim. Note ongoing hospice care discussion.  Sanda Klein, MD, [place] [phone] 05/02/2017, 2:33 PM

## 2017-05-02 NOTE — Progress Notes (Signed)
..  Name: Grant Todd [name]: [id number] DOB: April 04, 1955    ADMISSION DATE:  04/28/2017 CONSULTATION DATE:  04/28/2017  REFERRING MD : Dr. Laurann Montana  CHIEF COMPLAINT: short of breath  BRIEF PATIENT DESCRIPTION:  62 yo male with metastatic esophageal cancer tx with FOLFOX, started on pembrolizumab 04/26/17 presented with dyspnea.  Found to have lymphangitic spread, malignant Rt pleural effusion, A fib with RVR.  PMHx of RA, chemo induced neuropathy, chronic Lt pleural effusion.  SUBJECTIVE:  Remains on increased FiO2.  Remains tachycardic.  Intermittent coughing spells with worsening dyspnea.   VITAL SIGNS: BP (!) 80/60 (BP Location: Left Arm)   Pulse 79   Temp (!) 97.5 F (36.4 C) (Oral)   Resp 20   Ht 5\' 8"  (1.727 m)   Wt 171 lb 8.3 oz (77.8 kg)   SpO2 97%   BMI 26.08 kg/m   INTAKE/OUTPUT: I/O last 3 completed shifts: In: 2131.4 [P.O.:800; I.V.:231.4; IV Piggyback:1100] Out: 4193 [Urine:3158; Stool:3]  PHYSICAL EXAMINATION:  General - alert, sitting in chair Eyes - wears glasses ENT - no sinus tenderness, no oral exudate, no LAN Cardiac - irregular, tachycardic, no murmur Chest - rub on Rt, scattered rhonchi, no wheeze Abd - soft, non tender Ext - no edema Skin - no rashes Neuro - normal strength   LABS:  CBC Recent Labs    04/30/17 0341 05/01/17 0630 05/02/17 0655  WBC 19.7* 22.7* 22.2*  HGB 9.4* 10.2* 10.5*  HCT 30.3* 32.0* 33.2*  PLT 289 306 313    Coag's No results for input(s): APTT, INR in the last 72 hours.  BMET Recent Labs    04/30/17 0341 05/01/17 0630 05/02/17 0655  NA 141 141 145  K 3.8 3.8 4.2  CL 108 103 111  CO2 25 28 26   BUN 20 27* 27*  CREATININE 0.54* 0.70 0.57*  GLUCOSE 140* 128* 134*    Electrolytes Recent Labs    04/30/17 0341 05/01/17 0630 05/02/17 0655  CALCIUM 9.2 9.3 9.2  MG 1.9 2.2  --   PHOS 2.9 3.2  --     Sepsis Markers Recent Labs    04/30/17 0341  PROCALCITON 0.46    ABG No results for  input(s): PHART, PCO2ART, PO2ART in the last 72 hours.  Liver Enzymes Recent Labs    05/02/17 0655  AST 165*  ALT 142*  ALKPHOS 417*  BILITOT 0.6  ALBUMIN 2.0*    Cardiac Enzymes No results for input(s): TROPONINI, PROBNP in the last 72 hours.  Glucose No results for input(s): GLUCAP in the last 72 hours.  Imaging Dg Chest Port 1 View  Result Date: 05/02/2017 CLINICAL DATA:  Followup pulmonary edema. EXAM: PORTABLE CHEST 1 VIEW COMPARISON:  05/01/2017 FINDINGS: Power port on the right is unchanged with tip at the SVC RA junction. Small pleural effusions persist. Abnormal bilateral lung density which could be due to a combination of edema, collapse and pneumonia persists. This is worse on the right the left. Findings show slight worsening over time. IMPRESSION: Slow worsening bilateral effusions, atelectasis and areas airspace filling right worse left. Electronically Signed   By: Nelson Chimes M.D.   On: 05/02/2017 07:02   Dg Chest Port 1 View  Result Date: 05/01/2017 CLINICAL DATA:  Pleural effusion.  History of esophageal carcinoma EXAM: PORTABLE CHEST 1 VIEW COMPARISON:  Chest radiograph and chest CT April 28, 2017 FINDINGS: There is consolidation in the right upper lobe consistent with mass and associated infiltrate. There is airspace opacification in  the left upper lobe which appears slightly more prominent compared to recent studies, felt to most likely represent progression of pneumonia or possibly aspiration in the left upper lobe. There are stable pleural effusions bilaterally with bibasilar atelectatic change. Heart is upper normal in size with pulmonary vascularity within normal limits. Port-A-Cath tip is in the superior vena cava near the cavoatrial junction. No pneumothorax. There is adenopathy in the right paratracheal region, better seen on recent CT. IMPRESSION: Increase consolidation left upper lobe consistent with progression of pneumonia. Consolidation with mass right upper  lobe persists. Pleural effusions bilaterally appears stable with bibasilar atelectatic change. Stable cardiac prominence. Right paratracheal adenopathy persist, better delineated on recent CT. Electronically Signed   By: Lowella Grip III M.D.   On: 05/01/2017 07:47    STUDIES: Rt thoracentesis 3/25 >> 2 liters fluid removed; adenocarcinoma CT chest 3/29 >> mild CAD, b/l enlarged LAN, 5.1 cm RUL mass, interstitial thickening Rt upper and middle lobes, interstitial thickening Lt lung, stable moderate Lt effusion, rt effusion with some loculations  CULTURES: Blood 3/29 >> Respiratory viral panel 3/30 >> negative  ANTIBIOTICS: Vancomycin 3/29 >> 4/02 Cefepime 3/29 >>   EVENTS: 3/29 Admit, start cardizem 3/31 Oncology consulted 4/02 Cardiology consulted, start amiodarone; start solumedrol  DISCUSSION: Has persistent hypoxia.  Most likely from lymphangitic spread of esophageal cancer, malignant effusion, and large Rt lung tumor mass.  A fib with RVR also contributing.  Hasn't had much improvement with diuresis.  Infectious process seems less likely as a contributor at this point.  Oncology discussed hospice with pt, but he is not ready for this yet.  ASSESSMENT / PLAN:  Acute hypoxic respiratory failure. - oxygen to keep SpO2 > 90%  Possible HCAP. - day 5/7 of ABx - can d/c vancomycin   Malignant Rt pleural effusion. Chronic Lt pleural effusion. - f/u CXR intermittently  Metastatic esophageal cancer with lymphangitic spread. - oncology considering addition of taxol if respiratory and cardiac status stabilize - continue solumedrol for now  A fib with RVR. - cardiology consulted  DVT prophylaxis - SQ heparin SUP - Protonix Nutrition - regular diet Goals of care - DNR  D/w Dr. Benay Spice and Dr. Karleen Hampshire  Updated pt's family at bedside  Chesley Mires, MD [place] 05/02/2017, 8:55 AM

## 2017-05-02 NOTE — Progress Notes (Signed)
Paged Hospitalist Physician with the following message  Regarding ICU 1236 pt hypotensive 62/47 paged schorr no response. Amun Stemm RN

## 2017-05-03 ENCOUNTER — Other Ambulatory Visit: Payer: PRIVATE HEALTH INSURANCE

## 2017-05-03 ENCOUNTER — Ambulatory Visit: Payer: PRIVATE HEALTH INSURANCE | Admitting: Nurse Practitioner

## 2017-05-03 DIAGNOSIS — I480 Paroxysmal atrial fibrillation: Secondary | ICD-10-CM

## 2017-05-03 LAB — COMPREHENSIVE METABOLIC PANEL
ALK PHOS: 423 U/L — AB (ref 38–126)
ALT: 162 U/L — AB (ref 17–63)
ANION GAP: 12 (ref 5–15)
AST: 109 U/L — ABNORMAL HIGH (ref 15–41)
Albumin: 2.2 g/dL — ABNORMAL LOW (ref 3.5–5.0)
BUN: 27 mg/dL — ABNORMAL HIGH (ref 6–20)
CALCIUM: 9.5 mg/dL (ref 8.9–10.3)
CHLORIDE: 106 mmol/L (ref 101–111)
CO2: 25 mmol/L (ref 22–32)
CREATININE: 0.64 mg/dL (ref 0.61–1.24)
Glucose, Bld: 120 mg/dL — ABNORMAL HIGH (ref 65–99)
Potassium: 3.9 mmol/L (ref 3.5–5.1)
SODIUM: 143 mmol/L (ref 135–145)
Total Bilirubin: 0.5 mg/dL (ref 0.3–1.2)
Total Protein: 6.5 g/dL (ref 6.5–8.1)

## 2017-05-03 LAB — CBC
HCT: 35.7 % — ABNORMAL LOW (ref 39.0–52.0)
Hemoglobin: 11.3 g/dL — ABNORMAL LOW (ref 13.0–17.0)
MCH: 29.1 pg (ref 26.0–34.0)
MCHC: 31.7 g/dL (ref 30.0–36.0)
MCV: 92 fL (ref 78.0–100.0)
PLATELETS: 350 10*3/uL (ref 150–400)
RBC: 3.88 MIL/uL — AB (ref 4.22–5.81)
RDW: 18.9 % — AB (ref 11.5–15.5)
WBC: 25.9 10*3/uL — AB (ref 4.0–10.5)

## 2017-05-03 LAB — CULTURE, BLOOD (ROUTINE X 2)
Culture: NO GROWTH
SPECIAL REQUESTS: ADEQUATE

## 2017-05-03 LAB — MAGNESIUM: MAGNESIUM: 2.2 mg/dL (ref 1.7–2.4)

## 2017-05-03 MED ORDER — AMIODARONE HCL 200 MG PO TABS: 400.0000 mg | ORAL_TABLET | Freq: Every day | ORAL | Status: DC

## 2017-05-03 MED ORDER — METHYLPREDNISOLONE SODIUM SUCC 40 MG IJ SOLR
40.0000 mg | Freq: Two times a day (BID) | INTRAMUSCULAR | Status: DC
Start: 2017-05-03 — End: 2017-05-04
  Administered 2017-05-03 – 2017-05-04 (×2): 40 mg via INTRAVENOUS
  Filled 2017-05-03 (×2): qty 1

## 2017-05-03 MED ORDER — AMIODARONE HCL 200 MG PO TABS
400.0000 mg | ORAL_TABLET | Freq: Two times a day (BID) | ORAL | Status: DC
  Administered 2017-05-03 – 2017-05-05 (×4): 400 mg via ORAL
  Filled 2017-05-03 (×4): qty 2

## 2017-05-03 NOTE — Progress Notes (Signed)
Progress Note  Patient Name: Grant Todd [id number] Date of Encounter: 05/03/2017  Primary Cardiologist: No primary care provider on file.   Subjective   Markable improvement, back in sinus rhythm with occasional PACs.  Coughing has subsided and his breathing is much more comfortable. Note plan to initiate chemotherapy tomorrow with Taxol/carboplatin.  Inpatient Medications    Scheduled Meds: . Chlorhexidine Gluconate Cloth  6 each Topical Daily  . heparin  5,000 Units Subcutaneous Q8H  . methylPREDNISolone (SOLU-MEDROL) injection  40 mg Intravenous Q12H  . pantoprazole  40 mg Oral Daily  . sodium chloride flush  10-40 mL Intracatheter Q12H   Continuous Infusions: . sodium chloride    . amiodarone 30 mg/hr (05/03/17 1020)  . ceFEPime (MAXIPIME) IV Stopped (05/03/17 1513)  . diltiazem (CARDIZEM) infusion Stopped (05/02/17 1215)  . sodium chloride 937.5 mL/hr at 05/02/17 2231  . vancomycin Stopped (05/03/17 1122)   PRN Meds: sodium chloride, acetaminophen, sodium chloride flush   Vital Signs    Vitals:   05/03/17 1530 05/03/17 1600 05/03/17 1630 05/03/17 1710  BP: 133/87 135/88 (!) 141/89   Pulse: 95 (!) 104 (!) 105 (!) 101  Resp: 20 (!) 21 (!) 26 (!) 26  Temp:      TempSrc:      SpO2: 91% 92% 96% 97%  Weight:      Height:        Intake/Output Summary (Last 24 hours) at 05/03/2017 1721 Last data filed at 05/03/2017 1720 Gross per 24 hour  Intake 1189.99 ml  Output -  Net 1189.99 ml   Filed Weights   05/01/17 0354 05/02/17 0313 05/03/17 0459  Weight: 162 lb 11.2 oz (73.8 kg) 171 lb 8.3 oz (77.8 kg) 178 lb 5.6 oz (80.9 kg)    Telemetry    Sinus rhythm with occasional PACs- Personally Reviewed  ECG    No new tracing- Personally Reviewed  Physical Exam  Looks comfortable and is smiling now GEN: No acute distress.   Neck: No JVD Cardiac: RRR with occasional ectopy, no murmurs, rubs, or gallops.  Respiratory:  Left posterior rhonchi and rub GI: Soft, nontender,  non-distended  MS:  Mild pedal edema; No deformity. Neuro:  Nonfocal  Psych: Normal affect   Labs    Chemistry Recent Labs  Lab 05/01/17 0630 05/02/17 0655 05/03/17 0434  NA 141 145 143  K 3.8 4.2 3.9  CL 103 111 106  CO2 28 26 25   GLUCOSE 128* 134* 120*  BUN 27* 27* 27*  CREATININE 0.70 0.57* 0.64  CALCIUM 9.3 9.2 9.5  PROT  --  6.2* 6.5  ALBUMIN  --  2.0* 2.2*  AST  --  165* 109*  ALT  --  142* 162*  ALKPHOS  --  417* 423*  BILITOT  --  0.6 0.5  GFRNONAA >60 >60 >60  GFRAA >60 >60 >60  ANIONGAP 10 8 12      Hematology Recent Labs  Lab 05/01/17 0630 05/02/17 0655 05/03/17 0434  WBC 22.7* 22.2* 25.9*  RBC 3.52* 3.63* 3.88*  HGB 10.2* 10.5* 11.3*  HCT 32.0* 33.2* 35.7*  MCV 90.9 91.5 92.0  MCH 29.0 28.9 29.1  MCHC 31.9 31.6 31.7  RDW 18.8* 18.9* 18.9*  PLT 306 313 350    Cardiac EnzymesNo results for input(s): TROPONINI in the last 168 hours.  Recent Labs  Lab 04/28/17 1812  TROPIPOC 0.02     BNP Recent Labs  Lab 04/28/17 1808 05/01/17 0630  BNP 193.8* 133.0*  DDimer No results for input(s): DDIMER in the last 168 hours.   Radiology    Dg Chest Port 1 View  Result Date: 05/02/2017 CLINICAL DATA:  Followup pulmonary edema. EXAM: PORTABLE CHEST 1 VIEW COMPARISON:  05/01/2017 FINDINGS: Power port on the right is unchanged with tip at the SVC RA junction. Small pleural effusions persist. Abnormal bilateral lung density which could be due to a combination of edema, collapse and pneumonia persists. This is worse on the right the left. Findings show slight worsening over time. IMPRESSION: Slow worsening bilateral effusions, atelectasis and areas airspace filling right worse left. Electronically Signed   By: Nelson Chimes M.D.   On: 05/02/2017 07:02    Cardiac Studies   May 02, 2017 echo - Left ventricle: The cavity size was normal. Systolic function was   normal. The estimated ejection fraction was in the range of 60%   to 65%. Wall motion was  normal; there were no regional wall   motion abnormalities. The study is not technically sufficient to   allow evaluation of LV diastolic function. - Atrial septum: No defect or patent foramen ovale was identified. - Pulmonary arteries: PA peak pressure: 39 mm Hg (S).  Patient Profile     62 y.o. male with widely metastatic esophageal cancer including lymphangitic pulmonary spread, new onset atrial fibrillation rapid ventricular response, now converted to sinus rhythm on intravenous amiodarone  Assessment & Plan    1. Atrial Fibrillation: We will switch to oral amiodarone, we can restart intravenous medication if he is unable to tolerate p.o. after chemotherapy.  As discussed before, not a great candidate for chronic anticoagulation. 2.  Metastatic esophageal cancer with pleural involvement and lymphangitic spread in the lungs as well as widespread lymphadenopathy, left psoas and left gluteal muscle mets, liver metastases.  For questions or updates, please contact [place] Please consult [email or web address] for contact info under Cardiology/STEMI.      Signed, Sanda Klein, MD  05/03/2017, 5:21 PM

## 2017-05-03 NOTE — Progress Notes (Signed)
IP PROGRESS NOTE  Subjective:   Grant Todd is sitting up.  He reports increased dyspnea when lying down.  Pain remains improved.  Objective: Vital signs in last 24 hours: Blood pressure 125/72, pulse 92, temperature 97.9 F (36.6 C), temperature source Axillary, resp. rate (!) 22, height _0  (1.727 m), weight 178 lb 5.6 oz (80.9 kg), SpO2 94 %.  Intake/Output from previous day: 04/02 0701 - 04/03 0700 In: 1335.7 [P.O.:240; I.V.:395.7; IV Piggyback:700] Out: 250 [Urine:250]  Physical Exam:  HEENT: No thrush Lungs: Inspiratory rub throughout the left posterior chest Cardiac: Irregular, tachycardia Extremities: 1+ lower leg edema bilaterally   Portacath/PICC-without erythema  Lab Results: Recent Labs    05/02/17 0655 05/03/17 0434  WBC 22.2* 25.9*  HGB 10.5* 11.3*  HCT 33.2* 35.7*  PLT 313 350    BMET Recent Labs    05/02/17 0655 05/03/17 0434  NA 145 143  K 4.2 3.9  CL 111 106  CO2 26 25  GLUCOSE 134* 120*  BUN 27* 27*  CREATININE 0.57* 0.64  CALCIUM 9.2 9.5    Lab Results  Component Value Date   CEA1 1,045.42 (H) 04/12/2017    Studies/Results: Dg Chest Port 1 View  Result Date: 05/02/2017 CLINICAL DATA:  Followup pulmonary edema. EXAM: PORTABLE CHEST 1 VIEW COMPARISON:  05/01/2017 FINDINGS: Power port on the right is unchanged with tip at the SVC RA junction. Small pleural effusions persist. Abnormal bilateral lung density which could be due to a combination of edema, collapse and pneumonia persists. This is worse on the right the left. Findings show slight worsening over time. IMPRESSION: Slow worsening bilateral effusions, atelectasis and areas airspace filling right worse left. Electronically Signed   By: Nelson Chimes M.D.   On: 05/02/2017 07:02    Medications: I have reviewed the patient's current medications.  Assessment/Plan: 1. Metastatic esophagus cancer ? Distal esophagus mass at upper endoscopy 12/15/2016, biopsy confirmed invasive poorly  differentiated adenocarcinoma ? CTs of 02/16/2016-distal esophagus thickening, mediastinal/right hilar lymphadenopathy, abdominal/retroperitoneal lymphadenopathy, liver metastases ? MRI lumbar spine 12/20/2016-7 mm S1 lesion suspicious for metastasis, bulky retroperitoneal and prevertebral malignant lymphadenopathy, leftpsoasmuscle metastasis, degenerative disease causing severe neural foraminal stenosis at right L4 and left L5 ? PET scan 12/27/2016-hypermetabolic mass at the gastroesophageal junction; extensive hypermetabolic adenopathy involving the right supraclavicular nodal station, mediastinal lymph nodes, retrocrural lymph nodes, periaortic lymph nodes, left iliac lymph nodes and central mesenteric lymph nodes; fine nodularity in the right upper lobe with hypermetabolic thickening in the right suprahilar peribronchial structures; hypermetabolic metastasis to the right hepatic lobe; several small skeletal metastasis including right iliac bone, left sacrum and left scapula; multiple soft tissue metastases to the subcutaneous tissues adjacent to the left scapular musculature, retroperitoneal nodal metastasis along the left psoas muscle and metastatic lesion within the left gluteal musculature. No lesion in the lumbar spine or spinal canal to explain the patient's acute right-sided leg pain. ? Ultrasound-guided biopsy of a right liver lesion 01/04/2017-poorly differentiated adenocarcinoma ? PD-L1 expression30% ? Cycle 1 FOLFOX 01/05/2017 ? Cycle 2 FOLFOX 01/19/2017 ? Cycle 3 FOLFOX1/03/2017 ? Cycle 4 FOLFOX 02/15/2017 ? Cycle 5 FOLFOX 03/01/2017 ? CTs 03/13/2017 decreased size and number of lymph nodes in the chest, abdomen, and retroperitoneum. Decreased size of largest liver lesion, slight increase in size of small liver lesion, slight increase in left pleural effusion, development of inflammatory appearing airspace disease ? Cycle 6 FOLFOX 03/15/2017 ? Cycle 7 FOLFOX 03/29/2017 ? Cycle 8 FOLFOX  04/17/2017 (Oxaliplatin discontinued) ? CT chest  04/21/2017- progression of thoracic and upper abdominal nodal metastases, masslike opacity in the right upper lobe-progressive with associated lymphangitic tumor spread in the right lung, new right pleural effusion, unchanged left pleural effusion ? Thoracentesis 04/24/2017- metastatic adenocarcinoma ? Cycle 1 pembrolizumab 04/26/2017 2. Painpossibly due to disc herniation-the pain may be related to retroperitoneal lymphadenopathy or bone metastases;plain x-ray right hip 12/21/2016 with no blastic or lytic bone lesions. No fracture or dislocation.Review of the lumbar spine MRI shows right L4 disc herniation which could be responsible for the right leg pain and weakness. Trial of dexamethasone initiatedwith improvement.  3.Rheumatoid arthritis  4.Dysphagia/weight loss secondary to #1-improved 02/01/2017.  5.Chronic left pleural effusion  6.Port-A-Cath placement 01/04/2017  7.Early oxaliplatin neuropathy-diminished vibratory sense;mildly decreased to intact 03/29/2017.  8.Right lung inflammatory changes noted on chest CT 03/13/2017;increased cough 04/12/2017. Chest x-ray 04/12/2017 with similar appearance left chronic pleural effusion. Diffuse airspace disease right lung similar to prior chest CT.  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  Atrial fibrillation  Grant Todd continues to require a high oxygen flow rate.  He remains in rapid atrial fibrillation.  I discussed the case with pulmonary medicine.  The respiratory failure appears to be related to tumor spread in the lungs.  I discussed the poor prognosis with Grant Todd and  his wife.  We discussed comfort care and a hospice referral.  His goal is to leave the hospital and live for as long as possible.  It is too early to expect a clinical response from the pembrolizumab and the current steroids may be blunting the response to immunotherapy.  We discussed a trial of chemotherapy.  He understands the chance of chemotherapy producing a clinical response is small.  We discussed the risk/benefits of Taxol/carboplatin.  I reviewed potential toxicities associated with this regimen including the chance of allergic reaction, neuropathy, and hematologic toxicity.  He would like to proceed with a trial of chemotherapy.  The plan is to begin Taxol/carboplatin, weekly dosing, on 05/04/2017.  Recommendations: 1.  Management of respiratory failure and rapid atrial fibrillation per the pulmonary and cardiology services 2.  Wean oxygen and steroids as tolerated 3.  Taxol/carboplatin chemotherapy to start 05/04/2017       LOS: 5 days   Betsy Coder, MD   05/03/2017, 6:50 AM

## 2017-05-03 NOTE — Progress Notes (Signed)
..  Name: Grant Todd [name]: [id number] DOB: 01-25-1956    ADMISSION DATE:  04/28/2017 CONSULTATION DATE:  04/28/2017  REFERRING MD : Dr. Laurann Montana  CHIEF COMPLAINT: short of breath  BRIEF PATIENT DESCRIPTION:  62 yo male with metastatic esophageal cancer tx with FOLFOX, started on pembrolizumab 04/26/17 presented with dyspnea.  Found to have lymphangitic spread, malignant Rt pleural effusion, A fib with RVR.  PMHx of RA, chemo induced neuropathy, chronic Lt pleural effusion.  SUBJECTIVE:  Intermittent cough.  Still on increased oxygen.  VITAL SIGNS: BP 107/84   Pulse 81   Temp (!) 97.5 F (36.4 C) (Oral)   Resp (!) 24   Ht 5\' 8"  (1.727 m)   Wt 178 lb 5.6 oz (80.9 kg)   SpO2 94%   BMI 27.12 kg/m   INTAKE/OUTPUT: I/O last 3 completed shifts: In: 1906 [P.O.:240; I.V.:566; IV Piggyback:1100] Out: 256 [Urine:253; Stool:3]  PHYSICAL EXAMINATION:  General - pleasant Eyes - pupils reactive ENT - no sinus tenderness, no oral exudate, no LAN Cardiac - irregular, tachycardic, no murmur Chest - scattered rhonchi Abd - soft, non tender Ext - no edema Skin - no rashes Neuro - normal strength   LABS:  CBC Recent Labs    05/01/17 0630 05/02/17 0655 05/03/17 0434  WBC 22.7* 22.2* 25.9*  HGB 10.2* 10.5* 11.3*  HCT 32.0* 33.2* 35.7*  PLT 306 313 350    Coag's No results for input(s): APTT, INR in the last 72 hours.  BMET Recent Labs    05/01/17 0630 05/02/17 0655 05/03/17 0434  NA 141 145 143  K 3.8 4.2 3.9  CL 103 111 106  CO2 28 26 25   BUN 27* 27* 27*  CREATININE 0.70 0.57* 0.64  GLUCOSE 128* 134* 120*    Electrolytes Recent Labs    05/01/17 0630 05/02/17 0655 05/03/17 0434  CALCIUM 9.3 9.2 9.5  MG 2.2  --  2.2  PHOS 3.2  --   --     Sepsis Markers No results for input(s): PROCALCITON, O2SATVEN in the last 72 hours.  Invalid input(s): LACTICACIDVEN  ABG No results for input(s): PHART, PCO2ART, PO2ART in the last 72 hours.  Liver  Enzymes Recent Labs    05/02/17 0655 05/03/17 0434  AST 165* 109*  ALT 142* 162*  ALKPHOS 417* 423*  BILITOT 0.6 0.5  ALBUMIN 2.0* 2.2*    Cardiac Enzymes No results for input(s): TROPONINI, PROBNP in the last 72 hours.  Glucose No results for input(s): GLUCAP in the last 72 hours.  Imaging Dg Chest Port 1 View  Result Date: 05/02/2017 CLINICAL DATA:  Followup pulmonary edema. EXAM: PORTABLE CHEST 1 VIEW COMPARISON:  05/01/2017 FINDINGS: Power port on the right is unchanged with tip at the SVC RA junction. Small pleural effusions persist. Abnormal bilateral lung density which could be due to a combination of edema, collapse and pneumonia persists. This is worse on the right the left. Findings show slight worsening over time. IMPRESSION: Slow worsening bilateral effusions, atelectasis and areas airspace filling right worse left. Electronically Signed   By: Nelson Chimes M.D.   On: 05/02/2017 07:02    STUDIES: Rt thoracentesis 3/25 >> 2 liters fluid removed; adenocarcinoma CT chest 3/29 >> mild CAD, b/l enlarged LAN, 5.1 cm RUL mass, interstitial thickening Rt upper and middle lobes, interstitial thickening Lt lung, stable moderate Lt effusion, rt effusion with some loculations  CULTURES: Blood 3/29 >> Respiratory viral panel 3/30 >> negative  ANTIBIOTICS: Vancomycin 3/29 >> 4/02 Cefepime  3/29 >>   EVENTS: 3/29 Admit, start cardizem 3/31 Oncology consulted 4/02 Cardiology consulted, start amiodarone; start solumedrol  DISCUSSION: Has persistent hypoxia.  Most likely from lymphangitic spread of esophageal cancer, malignant effusion, and large Rt lung tumor mass.  A fib with RVR also contributing.  Hasn't had much improvement with diuresis.  Infectious process seems less likely as a contributor at this point.  Oncology discussed hospice with pt, but he is not ready for this yet.  Plan to start taxol 4/04.  ASSESSMENT / PLAN:  Acute hypoxic respiratory failure. - oxygen to  keep SpO2 > 90% - oxygen to keep SpO2 > 90%  Possible HCAP. - day 6/7 of ABx  Malignant Rt pleural effusion. Chronic Lt pleural effusion. - f/u CXR few days after getting taxol  Metastatic esophageal cancer with lymphangitic spread. - to start taxol 4/04 - wean off steroids  A fib with RVR. - per cardiology  DVT prophylaxis - SQ heparin SUP - Protonix Nutrition - regular diet Goals of care - DNR  D/w Dr. Benay Spice  Updated pt's family at bedside  Chesley Mires, MD [place] 05/03/2017, 9:40 AM

## 2017-05-03 NOTE — Progress Notes (Signed)
PROGRESS NOTE    Grant Todd  SAY:[id number] DOB: September 10, 1955 DOA: 04/28/2017 PCP: [name], MD  Brief Narrative:  62 yr old male with metastatic esophageal cancer s/p 8 cycles of [place] and recently started on pembrolizumab on 04/26/17 s/p thoracentesis of right pleural effusion on 04/24/17  presents with sob and tachycardia. He was admitted for acute on chronic respiratory failure with hypoxia from post obstructive pneumonia. He was admitted to PCCm service and transferred to [facility] ON 4/1. Overnight pt has remained in afib with RVR in 150's with minimal response to cardizem . Cardiology consulted this am and we started him on IV amiodarone.   patient has been on IV amiodarone and digoxin was also started, but unfortunately his rate has been in high 120's to 150's the last 24 hours.  Echocardiogram does not show any significant abnormalities.  Suspect his HR is driven by his ongoing lung disease. Discussed the results of the echo with the patient and answered his questions.   Assessment & Plan:   Active Problems:   Chronic pleural effusion   Sepsis (HCC)   Atrial tachycardia (HCC)   HCAP (healthcare-associated pneumonia)   Atrial fibrillation with RVR (HCC)   Acute respiratory  failure with hypoxia secondary to health care associated pneumonia, malignant effusion, and progression of the metastatic disease in the lung.  - remain in step down/ICU, as he is requiring 6 to 8 lit of high flow oxygen with decompensation with movements.  resume steroids as per pulmonary.  PCCM on board and assisting with management.     Atrial fibrillation with RVR: Probably driven by lung disease and dehydration. Pt has been on lasix and has diuresed about 5 lit on 3/30 to 4/1. He received almost another 5 liter NS boluses on 4/1 to 4/2 and his bp has been stable.  His rate has been between 120's to 150's the last 24 hours despite being on IV amiodarone and IV digoxin dose last night.  Currently he is  asymptomatic and denies any chest pain or palpitations.  Will probably need BB in addition to amiodarone for rate control.  Further management as per cardiology.    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  Oncology on board.   SBP: On cefepime.    AOCD: Transfuse to keep hemo globin greater than 7.  Hemoglobin stable between 10 to 11.  Monitor periodically.    elevated liver function tests; possibly from worsening metastatic disease in the liver. His bilirubin levels have been wnl.  Monitor for GI symptoms.    Worsening leukocytosis:   probably from Infection and steroids. Monitor.     DVT prophylaxis: heparin.  Code Status: DNR Family Communication: none At bedside today.  Disposition Plan:pending resolution of respiratory failure and atrial fibrillation.    Consultants:   PCCM,   oncology  Cardiology.    Procedures: echocardiogram   Antimicrobials:  Cefepime Vancomycin.   Subjective:  reports he had some sleep last night,  Continues to have cough and HR >120's.  Reports plan for chemo in the next 48 hours by Dr Learta Codding.   Objective: Vitals:   05/03/17 0530 05/03/17 0600 05/03/17 0630 05/03/17 0800  BP: 107/72 124/80 107/84   Pulse: 98 (!) 152 81   Resp: 19 (!) 26 (!) 24   Temp:    (!) 97.5 F (36.4 C)  TempSrc:    Oral  SpO2: 97% 99% 94%   Weight:      Height:  Intake/Output Summary (Last 24 hours) at 05/03/2017 0913 Last data filed at 05/03/2017 0530 Gross per 24 hour  Intake 1057.23 ml  Output -  Net 1057.23 ml   Filed Weights   05/01/17 0354 05/02/17 0313 05/03/17 0459  Weight: 73.8 kg (162 lb 11.2 oz) 77.8 kg (171 lb 8.3 oz) 80.9 kg (178 lb 5.6 oz)    Examination:  General exam: in mild distress from sob on 6 lit of [place] oxygen, sitting in the chair eating breakfast.  Respiratory system: tachypnea, diminished at bases. No wheezing heard today. No rhonchi.  Cardiovascular system: S1 & S2 heard,  tachycardia, irregular.no  rubs, gallops or clicks. Pedal edema present.  Gastrointestinal system: Abdomen is soft non tender non distended bowel sounds heard.  Central nervous system: Alert and oriented. Non focal.  Extremities: Symmetric 5 x 5 power. No cyanosis or clubbing. Hands and feet  are edematous  Skin: No rashes, lesions or ulcers Psychiatry: slightly anxious today.      Data Reviewed: I have personally reviewed following labs and imaging studies  CBC: Recent Labs  Lab 04/29/17 0300 04/30/17 0341 05/01/17 0630 05/02/17 0655 05/03/17 0434  WBC 17.5* 19.7* 22.7* 22.2* 25.9*  HGB 9.9* 9.4* 10.2* 10.5* 11.3*  HCT 31.0* 30.3* 32.0* 33.2* 35.7*  MCV 90.9 92.4 90.9 91.5 92.0  PLT 288 289 306 313 856   Basic Metabolic Panel: Recent Labs  Lab 04/28/17 2228 04/29/17 0300 04/30/17 0341 05/01/17 0630 05/02/17 0655 05/03/17 0434  NA  --  145 141 141 145 143  K  --  4.1 3.8 3.8 4.2 3.9  CL  --  112* 108 103 111 106  CO2  --  24 25 28 26 25   GLUCOSE  --  137* 140* 128* 134* 120*  BUN  --  20 20 27* 27* 27*  CREATININE  --  0.78 0.54* 0.70 0.57* 0.64  CALCIUM  --  8.9 9.2 9.3 9.2 9.5  MG 1.6* 1.7 1.9 2.2  --  2.2  PHOS 2.9 3.7 2.9 3.2  --   --    GFR: Estimated Creatinine Clearance: 90.3 mL/min (by C-G formula based on SCr of 0.64 mg/dL). Liver Function Tests: Recent Labs  Lab 05/02/17 0655 05/03/17 0434  AST 165* 109*  ALT 142* 162*  ALKPHOS 417* 423*  BILITOT 0.6 0.5  PROT 6.2* 6.5  ALBUMIN 2.0* 2.2*   No results for input(s): LIPASE, AMYLASE in the last 168 hours. No results for input(s): AMMONIA in the last 168 hours. Coagulation Profile: No results for input(s): INR, PROTIME in the last 168 hours. Cardiac Enzymes: No results for input(s): CKTOTAL, CKMB, CKMBINDEX, TROPONINI in the last 168 hours. BNP (last 3 results) No results for input(s): PROBNP in the last 8760 hours. HbA1C: No results for input(s): HGBA1C in the last 72 hours. CBG: No results  for input(s): GLUCAP in the last 168 hours. Lipid Profile: No results for input(s): CHOL, HDL, LDLCALC, TRIG, CHOLHDL, LDLDIRECT in the last 72 hours. Thyroid Function Tests: No results for input(s): TSH, T4TOTAL, FREET4, T3FREE, THYROIDAB in the last 72 hours. Anemia Panel: No results for input(s): VITAMINB12, FOLATE, FERRITIN, TIBC, IRON, RETICCTPCT in the last 72 hours. Sepsis Labs: Recent Labs  Lab 04/28/17 2123 04/28/17 2228 04/28/17 2252 04/29/17 0300 04/30/17 0341  PROCALCITON  --  0.88  --  0.86 0.46  LATICACIDVEN 1.95* 2.0* 1.96*  --   --     Recent Results (from the past 240 hour(s))  Blood culture (routine x  2)     Status: None (Preliminary result)   Collection Time: 04/28/17  7:10 PM  Result Value Ref Range Status   Specimen Description   Final    BLOOD LEFT ANTECUBITAL Performed at [facility] [address] [place], [place] [address]    Special Requests   Final    BOTTLES DRAWN AEROBIC AND ANAEROBIC Blood Culture adequate volume Performed at [facility] [address] [place], [place] [address]    Culture   Final    NO GROWTH 4 DAYS Performed at [facility], [place] [address] [place], [place] [address]    Report Status PENDING  Incomplete  Blood culture (routine x 2)     Status: None (Preliminary result)   Collection Time: 04/28/17  9:00 PM  Result Value Ref Range Status   Specimen Description   Final    BLOOD RIGHT ANTECUBITAL Performed at [facility] [address], [place], [place] [address]    Special Requests   Final    BOTTLES DRAWN AEROBIC AND ANAEROBIC Blood Culture adequate volume Performed at [facility] [address] [place], [place] [address]    Culture   Final    NO GROWTH 3 DAYS Performed at [facility], [place] [address] [place], [place] [address]    Report Status PENDING  Incomplete  Respiratory Panel by PCR     Status: None   Collection  Time: 04/29/17  1:17 AM  Result Value Ref Range Status   Adenovirus NOT DETECTED NOT DETECTED Final   Coronavirus 229E NOT DETECTED NOT DETECTED Final   Coronavirus HKU1 NOT DETECTED NOT DETECTED Final   Coronavirus NL63 NOT DETECTED NOT DETECTED Final   Coronavirus OC43 NOT DETECTED NOT DETECTED Final   Metapneumovirus NOT DETECTED NOT DETECTED Final   Rhinovirus / Enterovirus NOT DETECTED NOT DETECTED Final   Influenza A NOT DETECTED NOT DETECTED Final   Influenza B NOT DETECTED NOT DETECTED Final   Parainfluenza Virus 1 NOT DETECTED NOT DETECTED Final   Parainfluenza Virus 2 NOT DETECTED NOT DETECTED Final   Parainfluenza Virus 3 NOT DETECTED NOT DETECTED Final   Parainfluenza Virus 4 NOT DETECTED NOT DETECTED Final   Respiratory Syncytial Virus NOT DETECTED NOT DETECTED Final   Bordetella pertussis NOT DETECTED NOT DETECTED Final   Chlamydophila pneumoniae NOT DETECTED NOT DETECTED Final   Mycoplasma pneumoniae NOT DETECTED NOT DETECTED Final    Comment: Performed at [facility], [place] [address] [place], [place] [address]  MRSA PCR Screening     Status: None   Collection Time: 04/29/17  1:17 AM  Result Value Ref Range Status   MRSA by PCR NEGATIVE NEGATIVE Final    Comment:        The GeneXpert MRSA Assay (FDA approved for NASAL specimens only), is one component of a comprehensive MRSA colonization surveillance program. It is not intended to diagnose MRSA infection nor to guide or monitor treatment for MRSA infections. Performed at [facility], [place] [address], [place], [place] [address]          Radiology Studies: Dg Chest Port 1 View  Result Date: 05/02/2017 CLINICAL DATA:  Followup pulmonary edema. EXAM: PORTABLE CHEST 1 VIEW COMPARISON:  05/01/2017 FINDINGS: Power port on the right is unchanged with tip at the SVC RA junction. Small pleural effusions persist. Abnormal bilateral lung density which could be due to a combination of edema,  collapse  and pneumonia persists. This is worse on the right the left. Findings show slight worsening over time. IMPRESSION: Slow worsening bilateral effusions, atelectasis and areas airspace filling right worse left. Electronically Signed   By: Nelson Chimes M.D.   On: 05/02/2017 07:02        Scheduled Meds: . Chlorhexidine Gluconate Cloth  6 each Topical Daily  . heparin  5,000 Units Subcutaneous Q8H  . methylPREDNISolone (SOLU-MEDROL) injection  60 mg Intravenous Q6H  . pantoprazole  40 mg Oral Daily  . sodium chloride flush  10-40 mL Intracatheter Q12H   Continuous Infusions: . sodium chloride    . amiodarone 30 mg/hr (05/02/17 2121)  . ceFEPime (MAXIPIME) IV Stopped (05/03/17 0530)  . diltiazem (CARDIZEM) infusion Stopped (05/02/17 1215)  . sodium chloride Stopped (04/28/17 2150)   And  . sodium chloride    . sodium chloride 937.5 mL/hr at 05/02/17 2231  . vancomycin Stopped (05/02/17 2221)     LOS: 5 days    Time spent: 35 minutes.    Hosie Poisson, MD [facility] Pager [phone]   If 7PM-7AM, please contact night-coverage [email or web address] Password [facility] 05/03/2017, 9:13 AM

## 2017-05-03 NOTE — Progress Notes (Signed)
Pt is SR with PACs, MD notified

## 2017-05-04 ENCOUNTER — Inpatient Hospital Stay (HOSPITAL_COMMUNITY): Payer: No Typology Code available for payment source

## 2017-05-04 DIAGNOSIS — J441 Chronic obstructive pulmonary disease with (acute) exacerbation: Secondary | ICD-10-CM

## 2017-05-04 LAB — CULTURE, BLOOD (ROUTINE X 2)
Culture: NO GROWTH
SPECIAL REQUESTS: ADEQUATE

## 2017-05-04 MED ORDER — PREDNISONE 20 MG PO TABS
20.0000 mg | ORAL_TABLET | Freq: Every day | ORAL | Status: DC
  Administered 2017-05-05: 20 mg via ORAL
  Filled 2017-05-04: qty 1

## 2017-05-04 NOTE — Progress Notes (Signed)
05/04/17 Just notified by Dr[facility] Novel of [place], per Dr Benay Spice patient will not be receiving chemo today.

## 2017-05-04 NOTE — Progress Notes (Signed)
..  Name: Grant Todd [name]: [id number] DOB: [birth year]    ADMISSION DATE:  04/28/2017 CONSULTATION DATE:  04/28/2017  REFERRING MD : Dr. Laurann Montana  CHIEF COMPLAINT: short of breath  BRIEF PATIENT DESCRIPTION:  62 yo male with metastatic esophageal cancer tx with FOLFOX, started on pembrolizumab 04/26/17 presented with dyspnea.  Found to have lymphangitic spread, malignant Rt pleural effusion, A fib with RVR.  PMHx of RA, chemo induced neuropathy, chronic Lt pleural effusion.  SUBJECTIVE:  Feeling better.  O2 needs improved.  VITAL SIGNS: BP 135/69   Pulse (!) 106   Temp 98.1 F (36.7 C) (Oral)   Resp 20   Ht 5\' 8"  (1.727 m)   Wt 181 lb 14.1 oz (82.5 kg)   SpO2 95%   BMI 27.65 kg/m   INTAKE/OUTPUT: I/O last 3 completed shifts: In: 1779.2 [I.V.:599.2; IV [id number]:3086] Out: 200 [Urine:200]  PHYSICAL EXAMINATION:  General - pleasant Eyes - pupils reactive ENT - no sinus tenderness, no oral exudate, no LAN Cardiac - irregular, no murmur Chest - decreased BS at bases Rt > Lt Abd - soft, non tender Ext - no edema Skin - no rashes Neuro - normal strength    LABS:  CBC Recent Labs    05/02/17 0655 05/03/17 0434  WBC 22.2* 25.9*  HGB 10.5* 11.3*  HCT 33.2* 35.7*  PLT 313 350    Coag's No results for input(s): APTT, INR in the last 72 hours.  BMET Recent Labs    05/02/17 0655 05/03/17 0434  NA 145 143  K 4.2 3.9  CL 111 106  CO2 26 25  BUN 27* 27*  CREATININE 0.57* 0.64  GLUCOSE 134* 120*    Electrolytes Recent Labs    05/02/17 0655 05/03/17 0434  CALCIUM 9.2 9.5  MG  --  2.2    Sepsis Markers No results for input(s): PROCALCITON, O2SATVEN in the last 72 hours.  Invalid input(s): LACTICACIDVEN  ABG No results for input(s): PHART, PCO2ART, PO2ART in the last 72 hours.  Liver Enzymes Recent Labs    05/02/17 0655 05/03/17 0434  AST 165* 109*  ALT 142* 162*  ALKPHOS 417* 423*  BILITOT 0.6 0.5  ALBUMIN 2.0* 2.2*    Cardiac  Enzymes No results for input(s): TROPONINI, PROBNP in the last 72 hours.  Glucose No results for input(s): GLUCAP in the last 72 hours.  Imaging Dg Chest Port 1 View  Result Date: 05/04/2017 CLINICAL DATA:  History of esophageal carcinoma with metastatic involvement of the lungs, recent chemotherapy, now with cardiac issues EXAM: PORTABLE CHEST 1 VIEW COMPARISON:  Portable chest x-ray of 05/02/2016, and CT chest of 04/28/2017 FINDINGS: There are bilateral pleural effusions with mild cardiomegaly and minimal congestion somewhat improved compared to the chest x-ray of 05/02/2017. These findings again are most consistent with mild CHF. There is also parenchymal infiltrate in the right upper lobe consistent with loculated effusion and known malignancy. Right-sided Port-A-Cath remains with the tip overlying the lower SVC. No bony abnormality is noted. IMPRESSION: 1. Little change in bilateral effusions and some haziness throughout the lungs which may indicate residual although slightly improved CHF. 2. Parenchymal opacity in the right upper lobe peripherally consistent with loculated effusion and known malignancy when compared to the recent CT of the chest. Electronically Signed   By: Ivar Drape M.D.   On: 05/04/2017 09:44    STUDIES: Rt thoracentesis 3/25 >> 2 liters fluid removed; adenocarcinoma CT chest 3/29 >> mild CAD, b/l enlarged LAN, 5.1 cm  RUL mass, interstitial thickening Rt upper and middle lobes, interstitial thickening Lt lung, stable moderate Lt effusion, rt effusion with some loculations  CULTURES: Blood 3/29 >> Respiratory viral panel 3/30 >> negative  ANTIBIOTICS: Vancomycin 3/29 >> 4/04 Cefepime 3/29 >> 4/04  EVENTS: 3/29 Admit, start cardizem 3/31 Oncology consulted 4/02 Cardiology consulted, start amiodarone; start solumedrol 4/04 Change to solumedrol  DISCUSSION: Main issue appears to be lymphangitic spread of esophageal cancer with malignant effusion on the Rt.  CXR is  stable.  He had improvement in respiratory status, and this could be related to pembrolizumab taking effect.  A fib with RVR seems to be driven by hypoxia, and has improved.  ASSESSMENT / PLAN:  Acute hypoxic respiratory failure. - oxygen to keep SpO2 > 90%  Possible HCAP. - complete ABx after doses on 4/04  Malignant Rt pleural effusion. Chronic Lt pleural effusion. - f/u CXR intermittently - no role for pleurx catheter at this time  Metastatic esophageal cancer with lymphangitic spread. - oncology has decided to defer taxol since he has improvement in respiratory status - change to prednisone 20 mg daily and wean off as tolerated over next 5 to 7 days  A fib with RVR. - transitioned to po amiodarone  DVT prophylaxis - SQ heparin SUP - Protonix Nutrition - regular diet Goals of care - DNR  Updated pt's family at bedside  Chesley Mires, MD [facility] 05/04/2017, 10:13 AM

## 2017-05-04 NOTE — Progress Notes (Signed)
05/04/17  1000 Notified Dr Joesph Fillers that patients chest xray results were in. Waiting for response to know if patient will be receiving chemo today.

## 2017-05-04 NOTE — Progress Notes (Signed)
Remains in NSR. Would keep on amiodarone as described in previous notes, with plan to reduce to 200 mg daily after a month. Will follow rhythm from a distance. Please reconsult if needed. Sanda Klein, MD, [facility] [phone] office [phone] pager

## 2017-05-04 NOTE — Plan of Care (Signed)
  Problem: Activity: Goal: Risk for activity intolerance will decrease Outcome: Progressing   Problem: Safety: Goal: Ability to remain free from injury will improve Outcome: Progressing   Problem: Skin Integrity: Goal: Risk for impaired skin integrity will decrease Outcome: Progressing   

## 2017-05-04 NOTE — Progress Notes (Addendum)
IP PROGRESS NOTE  Subjective:   Grant Todd reports improvement in dyspnea.  He continues to have an intermittent cough.  Pain remains improved.  Objective: Vital signs in last 24 hours: Blood pressure 135/69, pulse 61, temperature 97.9 F (36.6 C), temperature source Oral, resp. rate (!) 21, height '5\' 8"'$  (1.727 m), weight 181 lb 14.1 oz (82.5 kg), SpO2 96 %.  Intake/Output from previous day: 04/03 0701 - 04/04 0700 In: 1353.9 [I.V.:573.9; IV Piggyback:780] Out: 200 [Urine:200]  Physical Exam:  HEENT: No thrush Lungs: Inspiratory rub throughout the left chest, good air movement bilaterally, no respiratory distress Cardiac: Irregular Abdomen: Soft and nontender, no hepatomegaly Extremities: 1+ lower leg edema bilaterally   Portacath/PICC-without erythema  Lab Results: Recent Labs    05/02/17 0655 05/03/17 0434  WBC 22.2* 25.9*  HGB 10.5* 11.3*  HCT 33.2* 35.7*  PLT 313 350    BMET Recent Labs    05/02/17 0655 05/03/17 0434  NA 145 143  K 4.2 3.9  CL 111 106  CO2 26 25  GLUCOSE 134* 120*  BUN 27* 27*  CREATININE 0.57* 0.64  CALCIUM 9.2 9.5    Lab Results  Component Value Date   CEA1 1,045.42 (H) 04/12/2017    Studies/Results: No results found.  Medications: I have reviewed the patient's current medications.  Assessment/Plan: 1. Metastatic esophagus cancer ? Distal esophagus mass at upper endoscopy 12/15/2016, biopsy confirmed invasive poorly differentiated adenocarcinoma ? CTs of 02/16/2016-distal esophagus thickening, mediastinal/right hilar lymphadenopathy, abdominal/retroperitoneal lymphadenopathy, liver metastases ? MRI lumbar spine 12/20/2016-7 mm S1 lesion suspicious for metastasis, bulky retroperitoneal and prevertebral malignant lymphadenopathy, leftpsoasmuscle metastasis, degenerative disease causing severe neural foraminal stenosis at right L4 and left L5 ? PET scan 12/27/2016-hypermetabolic mass at the gastroesophageal junction; extensive  hypermetabolic adenopathy involving the right supraclavicular nodal station, mediastinal lymph nodes, retrocrural lymph nodes, periaortic lymph nodes, left iliac lymph nodes and central mesenteric lymph nodes; fine nodularity in the right upper lobe with hypermetabolic thickening in the right suprahilar peribronchial structures; hypermetabolic metastasis to the right hepatic lobe; several small skeletal metastasis including right iliac bone, left sacrum and left scapula; multiple soft tissue metastases to the subcutaneous tissues adjacent to the left scapular musculature, retroperitoneal nodal metastasis along the left psoas muscle and metastatic lesion within the left gluteal musculature. No lesion in the lumbar spine or spinal canal to explain the patient's acute right-sided leg pain. ? Ultrasound-guided biopsy of a right liver lesion 01/04/2017-poorly differentiated adenocarcinoma ? PD-L1 expression30% ? Cycle 1 FOLFOX 01/05/2017 ? Cycle 2 FOLFOX 01/19/2017 ? Cycle 3 FOLFOX1/03/2017 ? Cycle 4 FOLFOX 02/15/2017 ? Cycle 5 FOLFOX 03/01/2017 ? CTs 03/13/2017 decreased size and number of lymph nodes in the chest, abdomen, and retroperitoneum. Decreased size of largest liver lesion, slight increase in size of small liver lesion, slight increase in left pleural effusion, development of inflammatory appearing airspace disease ? Cycle 6 FOLFOX 03/15/2017 ? Cycle 7 FOLFOX 03/29/2017 ? Cycle 8 FOLFOX 04/17/2017 (Oxaliplatin discontinued) ? CT chest 04/21/2017- progression of thoracic and upper abdominal nodal metastases, masslike opacity in the right upper lobe-progressive with associated lymphangitic tumor spread in the right lung, new right pleural effusion, unchanged left pleural effusion ? Thoracentesis 04/24/2017- metastatic adenocarcinoma ? Cycle 1 pembrolizumab 04/26/2017 2. Painpossibly due to disc herniation-the pain may be related to retroperitoneal lymphadenopathy or bone metastases;plain x-ray right  hip 12/21/2016 with no blastic or lytic bone lesions. No fracture or dislocation.Review of the lumbar spine MRI shows right L4 disc herniation which could be responsible  for the right leg pain and weakness. Trial of dexamethasone initiatedwith improvement.  3.Rheumatoid arthritis  4.Dysphagia/weight loss secondary to #1-improved 02/01/2017.  5.Chronic left pleural effusion  6.Port-A-Cath placement 01/04/2017  7.Early oxaliplatin neuropathy-diminished vibratory sense;mildly decreased to intact 03/29/2017.  8.Right lung inflammatory changes noted on chest CT 03/13/2017;increased cough 04/12/2017. Chest x-ray 04/12/2017 with similar appearance left chronic pleural effusion. Diffuse airspace disease right lung similar to prior chest CT.  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  Atrial fibrillation  Grant Todd has experienced significant clinical improvement over the past few days.  He is now on a 3 L nasal cannula and the tachycardia is improved.  The plan was to begin Taxol/carboplatin chemotherapy today.  We will check a chest x-ray today.  If there is improvement in the lung infiltrates I will hold chemotherapy. It is possible the progressive respiratory failure over the past week was related to a tumor flare from immunotherapy.  Pneumonitis seems less likely given the rapid decline following pembrolizumab.   Recommendations: 1.  Continue to wean oxygen as tolerated 2.  Chest x-ray this morning 3.  Taxol/carboplatin will be placed on hold pending review of the chest x-ray   Addendum: I reviewed the chest x-ray from today.  There is no significant change compared to the  chest x-ray from 05/02/2017.  I discussed the situation with Grant Todd.  His clinical status continues to be improved and he is requiring less oxygen again today.  We will hold chemotherapy today.  We will consider administering Taxol/carboplatin if his clinical status is not better on 05/05/2017.    LOS: 6 days   Betsy Coder, MD   05/04/2017, 7:20 AM

## 2017-05-04 NOTE — Progress Notes (Signed)
Patient Demographics:    Grant Todd, is a 62 y.o. male, DOB - 1955-12-19, ZCH:[id number]  Admit date - 04/28/2017   Admitting Physician Kandice Hams, MD  Outpatient Primary MD for the patient is [name], MD  LOS - 6   Chief Complaint  Patient presents with  . 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 He was admitted for acute on chronic respiratory failure with hypoxia from post obstructive pneumonia. He was admitted to PCCm service and transferred to [facility] ON 4/1. Overnight pt has remained in afib with RVR in 150's with minimal response to cardizem . Cardiology consulted this am and we started him on IV amiodarone.   patient has been on IV amiodarone and digoxin was also started, but unfortunately his rate has been in high 120's to 150's the last 24 hours.  Echocardiogram does not show any significant abnormalities.  62 yo male with metastatic esophageal cancer tx with FOLFOX, started on pembrolizumab 04/26/17 presented with dyspnea.  Found to have lymphangitic spread, malignant Rt pleural effusion, A fib with RVR.  PMHx of RA, chemo induced neuropathy, chronic Lt pleural effusion.   Assessment & Plan:   Active Problems:   Chronic pleural effusion   Sepsis (HCC)   Atrial tachycardia (HCC)   HCAP (healthcare-associated pneumonia)   Atrial  fibrillation with RVR (HCC)   1)Acute chronic respiratory  failure with hypoxia secondary to health care associated pneumonia, malignant effusion, and progression of the metastatic disease in the lung.  Continue to wean off oxygen, PCCM input appreciated, last dose of antibiotics 05/04/2017 (patient was on cefepime and vancomycin from 04/28/2017 through 05/04/2017), prior to admission patient was on 4 L of oxygen at home    2)Atrial fibrillation with RVR:- Probably driven by lung disease and dehydration. Pt has been on lasix and has diuresed about 5 lit on 3/30 to 4/1. He received almost another 5 liter NS boluses on 4/1 to 4/2 and his bp has been stable.  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 Learta Codding from oncology service will hold off on chemotherapy today  4)SBP: Treated with cefepime  5) acute on chronic anemia in the setting of underlying malignancy--- hemoglobin is currently above 10, no evidence of bleeding  6) elevated liver function tests; possibly from worsening metastatic disease in the liver. His bilirubin levels have been wnl.  monitor for GI symptoms.    7)Malignant Rt pleural effusion.-Chronic Lt pleural effusion, - f/u CXR as outpt and then determine if he should consider pleurx >> doesn't need at present  DVT prophylaxis: heparin.  Code Status: DNR Family Communication: none At bedside today.  Disposition Plan:pending resolution of respiratory failure and atrial fibrillation.    Consultants:   PCCM,   oncology  Cardiology.    Procedures: echocardiogram   Antimicrobials:  Cefepime Vancomycin.     DVT Prophylaxis  :  Heparin    Lab Results  Component Value Date   PLT 350 05/03/2017    Inpatient Medications  Scheduled Meds: . amiodarone  400 mg Oral BID  . [START ON 05/10/2017] amiodarone  400 mg Oral Daily  . Chlorhexidine Gluconate  Cloth  6 each Topical Daily  . heparin  5,000 Units Subcutaneous Q8H  . pantoprazole  40 mg Oral Daily  . [START ON 05/05/2017] predniSONE  20 mg Oral Q breakfast  . sodium chloride flush  10-40 mL Intracatheter Q12H   Continuous Infusions: . sodium chloride    . ceFEPime (MAXIPIME) IV Stopped (05/04/17 1430)  . diltiazem (CARDIZEM) infusion Stopped (05/02/17 1215)  . vancomycin Stopped (05/04/17 1040)   PRN Meds:.sodium chloride, acetaminophen, sodium chloride flush    Anti-infectives (From admission, onward)   Start     Dose/Rate Route Frequency Ordered Stop   05/02/17 1400  ceFEPIme (MAXIPIME) 1 g in sodium chloride 0.9 % 100 mL IVPB     1 g 200 mL/hr over 30 Minutes Intravenous Every 8 hours 05/02/17 1105 05/05/17 0559   04/29/17 1800  vancomycin (VANCOCIN) 1,500 mg in sodium chloride 0.9 % 500 mL IVPB  Status:  Discontinued     1,500 mg 250 mL/hr over 120 Minutes Intravenous Every 24 hours 04/29/17 0343 04/29/17 0927   04/29/17 1000  vancomycin (VANCOCIN) IVPB 1000 mg/200 mL premix     1,000 mg 200 mL/hr over 60 Minutes Intravenous Every 12 hours 04/29/17 0928 05/05/17 0959   04/29/17 0600  ceFEPIme (MAXIPIME) 2 g in sodium chloride 0.9 % 100 mL IVPB  Status:  Discontinued     2 g 200 mL/hr over 30 Minutes Intravenous Every 8 hours 04/29/17 0341 05/02/17 1105   04/28/17 1930  ceFEPIme (MAXIPIME) 1 g in sodium chloride 0.9 % 100 mL IVPB  Status:  Discontinued     1 g 200 mL/hr over 30 Minutes Intravenous  Once 04/28/17 1848 04/28/17 1901   04/28/17 1930  vancomycin (VANCOCIN) 1,500 mg in sodium chloride 0.9 % 500 mL IVPB     1,500 mg 250 mL/hr over 120 Minutes Intravenous  Once 04/28/17 1856 04/29/17 0101   04/28/17 1915  ceFEPIme (MAXIPIME) 2 g in sodium chloride 0.9 % 100 mL IVPB     2 g 200 mL/hr over 30 Minutes Intravenous  Once 04/28/17 1901 04/29/17 0020        Objective:   Vitals:   05/04/17 1000 05/04/17 1200 05/04/17 1400 05/04/17 1840  BP: 138/88 133/70  133/88 (!) 151/92  Pulse: (!) 108 62 (!) 103 (!) 102  Resp: 19 20 (!) 34 (!) 27  Temp:  98 F (36.7 C)    TempSrc:  Oral    SpO2: 94% 92% 94% 95%  Weight:      Height:        Wt Readings from Last 3 Encounters:  05/04/17 82.5 kg (181 lb 14.1 oz)  04/28/17 74.1 kg (163 lb 6.4 oz)  04/26/17 75.1 kg (165 lb 9.6 oz)     Intake/Output Summary (Last 24 hours) at 05/04/2017 1950 Last data filed at 05/04/2017 1430 Gross per 24 hour  Intake 917.58 ml  Output  200 ml  Net 717.58 ml     Physical Exam  Gen:- Awake Alert,  In no apparent distress  HEENT:- [place].AT, No sclera icterus Nose- [place] 4 L/min Neck-Supple Neck,No JVD,.  Lungs-diminished in bases with scattered wheezes  CV- S1, S2 normal, irregular, right-sided Port-A-Cath site is clean dry and intact Abd-  +ve B.Sounds, Abd Soft, No tenderness,    Extremity/Skin:-good pulses, skin is warm and dry  psych-affect is appropriate, oriented x3 Neuro-no new focal deficits, no tremors   Data Review:   Micro Results Recent Results (from the past 240 hour(s))  Blood culture (routine x 2)     Status: None   Collection Time: 04/28/17  7:10 PM  Result Value Ref Range Status   Specimen Description   Final    BLOOD LEFT ANTECUBITAL Performed at [facility] [address], [place], [place] [address]    Special Requests   Final    BOTTLES DRAWN AEROBIC AND ANAEROBIC Blood Culture adequate volume Performed at [facility] [address] [place], [place] [address]    Culture   Final    NO GROWTH 5 DAYS Performed at [facility], [place] [address] [place], [place] [address]    Report Status 05/03/2017 FINAL  Final  Blood culture (routine x 2)     Status: None   Collection Time: 04/28/17  9:00 PM  Result Value Ref Range Status   Specimen Description   Final    BLOOD RIGHT ANTECUBITAL Performed at [facility] [address], [place], [place] [address]    Special Requests    Final    BOTTLES DRAWN AEROBIC AND ANAEROBIC Blood Culture adequate volume Performed at [facility] [address] [place], [place] [address]    Culture   Final    NO GROWTH 5 DAYS Performed at [facility], [place] [address], [place], [place] [address]    Report Status 05/04/2017 FINAL  Final  Respiratory Panel by PCR     Status: None   Collection Time: 04/29/17  1:17 AM  Result Value Ref Range Status   Adenovirus NOT DETECTED NOT DETECTED Final   Coronavirus 229E NOT DETECTED NOT DETECTED Final   Coronavirus HKU1 NOT DETECTED NOT DETECTED Final   Coronavirus NL63 NOT DETECTED NOT DETECTED Final   Coronavirus OC43 NOT DETECTED NOT DETECTED Final   Metapneumovirus NOT DETECTED NOT DETECTED Final   Rhinovirus / Enterovirus NOT DETECTED NOT DETECTED Final   Influenza A NOT DETECTED NOT DETECTED Final   Influenza B NOT DETECTED NOT DETECTED Final   Parainfluenza Virus 1 NOT DETECTED NOT DETECTED Final   Parainfluenza Virus 2 NOT DETECTED NOT DETECTED Final   Parainfluenza Virus 3 NOT DETECTED NOT DETECTED Final   Parainfluenza Virus 4 NOT DETECTED NOT DETECTED Final   Respiratory Syncytial Virus NOT DETECTED NOT DETECTED Final   Bordetella pertussis NOT DETECTED NOT DETECTED Final   Chlamydophila pneumoniae NOT DETECTED NOT DETECTED Final   Mycoplasma pneumoniae NOT DETECTED NOT DETECTED Final    Comment: Performed at [facility], [place] [address] [place], [place] [address]  MRSA PCR Screening     Status: None   Collection Time: 04/29/17  1:17 AM  Result Value Ref Range Status   MRSA by PCR NEGATIVE NEGATIVE Final    Comment:        The GeneXpert MRSA Assay (FDA approved for NASAL specimens only), is one component of a  comprehensive MRSA colonization surveillance program. It is not intended to diagnose MRSA infection nor to guide or monitor treatment for MRSA infections. Performed at [facility], [place] [address]  [place], [place] [address]     Radiology Reports Dg Chest 1 View  Result Date: 04/24/2017 CLINICAL DATA:  Status post thoracentesis, history of esophageal cancer EXAM: CHEST  1 VIEW COMPARISON:  CT chest 04/21/2017, radiograph 04/12/2017 FINDINGS: Small bilateral pleural effusions. Right-sided central venous port tip overlies SVC. No pneumothorax identified. Masslike consolidation in the right upper lobe. Patchy infiltrate in the right peripheral lower lung. Stable cardiomediastinal silhouette. No pneumothorax. IMPRESSION: 1. Small right pleural effusion. Similar appearance of left pleural effusion or thickening. No pneumothorax seen. 2. Slight increased interstitial opacity in the right thorax. Similar appearance of masslike opacity in the right upper lobe with increased patchy infiltrate in the peripheral right lower lung. Electronically Signed   By: Donavan Foil M.D.   On: 04/24/2017 14:24   Dg Chest 2 View  Result Date: 04/12/2017 CLINICAL DATA:  Persistent cough. EXAM: CHEST - 2 VIEW COMPARISON:  CT chest 03/13/2017. most recent chest x-ray is from 09/14/2011. FINDINGS: Similar appearance of left chronic pleural effusion. Relatively diffuse airspace disease noted in the right mid and upper lung, difficult to directly compare to CT scan from 1 month ago, but likely similar. Heart size upper normal. Right Port-A-Cath tip overlies the distal SVC. The visualized bony structures of the thorax are intact. IMPRESSION: Diffuse airspace disease right lung, similar to prior chest CT. Chronic left pleural effusion. Electronically Signed   By: Misty Stanley M.D.   On: 04/12/2017 11:01   Ct Chest Wo Contrast  Result Date: 04/22/2017 CLINICAL DATA:  Esophageal cancer, diagnosed 12/15/2016, chemotherapy ongoing EXAM: CT CHEST WITHOUT CONTRAST TECHNIQUE: Multidetector CT imaging of the chest was performed following the standard protocol without IV contrast. COMPARISON:  03/13/2016 FINDINGS: Cardiovascular: Heart is  normal in size.  No pericardial effusion. No evidence of thoracic aortic aneurysm. Very mild atherosclerotic calcifications of the aortic arch. Mild coronary atherosclerosis of the LAD (series 2/image 89). Right chest port terminates in the lower SVC. Mediastinum/Nodes: Mild progression of thoracic lymphadenopathy, including: --9 mm short axis left supraclavicular node (series 2/image 15), new --2.3 cm short axis right paratracheal node (series 2/image 57), unchanged --1.5 cm short axis AP window node (series 2/image 9), previously 1.3 cm --1.8 cm short axis right hilar node (series 2/image 69), previously 1.6 cm --1.6 cm short axis subcarinal node (series 2/image 35), previously 1.4 cm --11 mm short axis lower paraesophageal node (series 2/image 110), new Equivocal wall thickening involving the distal esophagus just above the GE junction (sagittal image 112). Visualized thyroid is grossly unremarkable. Lungs/Pleura: 5.5 x 5.0 cm mass-like opacity in the right upper lobe (series 5/image 51), reflecting progression from prior CT. This appearance is worrisome for tumor. Surrounding increased interstitial markings in the right upper lobe suggests lymphangitic spread (series 5/image 60). 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 Moderate right pleural effusion, new. Associated mild right basilar atelectasis. Small left pleural effusion, grossly unchanged. No pneumothorax. Upper Abdomen: Visualized upper abdomen is notable for a 1.5 cm hepatic lesion inferiorly in segment 5 (series 2/image 134), grossly unchanged but incompletely visualized. Left para-aortic lymphadenopathy measuring up to 1.2 cm short axis (series 2/image 134), previously 11 mm. Musculoskeletal: Visualized osseous structures are within normal limits. IMPRESSION: Equivocal wall  thickening involving the distal esophagus just above the GE  junction, possibly reflecting the site of the patient's treated primary tumor. Progression of thoracic and upper abdominal nodal metastases, as above. 5.5 cm masslike opacity in the right upper lobe, progressed, worrisome for tumor. Associated lymphangitic spread in the right lung. Moderate right pleural effusion, new. Small left pleural effusion, grossly unchanged. 15 mm hepatic metastasis, grossly unchanged but incompletely visualized. Additional known hepatic metastasis is not imaged. Aortic Atherosclerosis (ICD10-I70.0). Electronically Signed   By: Julian Hy M.D.   On: 04/22/2017 09:20   Ct Angio Chest Pe W And/or Wo Contrast  Result Date: 04/28/2017 CLINICAL DATA:  Esophageal cancer. Ongoing chemotherapy. Patient complains of shortness breath. EXAM: CT ANGIOGRAPHY CHEST WITH CONTRAST TECHNIQUE: Multidetector CT imaging of the chest was performed using the standard protocol during bolus administration of intravenous contrast. Multiplanar CT image reconstructions and MIPs were obtained to evaluate the vascular anatomy. CONTRAST:  170mL ISOVUE-370 IOPAMIDOL (ISOVUE-370) INJECTION 76% COMPARISON:  04/21/2017 FINDINGS: Cardiovascular: Satisfactory opacification of the pulmonary arteries to the segmental level. No evidence of pulmonary embolism. Mildly enlarged heart. Tiny pericardial effusion. Mild coronary artery disease. Central venous catheter terminates in the distal superior vena cava. Mediastinum/Nodes: Enlarged bilateral mediastinal lymph nodes are not significantly changed with the largest node in right pretracheal location measuring 1.9 cm. Again seen is circumferential thickening of the distal esophagus just above the GE junction. The thyroid is grossly unremarkable. Lungs/Pleura: Right upper lobe pulmonary mass appears more fragmented and measures approximately 4.4 x 5.1 cm. There is subpleural soft tissue thickening extending inferiorly along the anterior pleura of the right upper lobe and  right middle lobe, likely representing extension of disease. There is persistent interstitial thickening in the right upper lobe and right middle lobe suggestive of lymphangitic spread of disease. Interval development of interstitial thickening in the left lung, with upper lobe predominance. Stable moderate in size left pleural effusion. The right pleural effusion demonstrates loculations. Upper Abdomen: Interval development of water density pleural effusion, moderate. Musculoskeletal: No chest wall abnormality. No acute or significant osseous findings. Review of the MIP images confirms the above findings. IMPRESSION: No evidence of pulmonary embolus. Previously demonstrated right upper lobe malignancy appears more fragmented and measures slightly smaller at 5.1 cm in greatest dimension. Progression of lymphangitic spread of disease in the right upper and right middle lobes. Interval development of interstitial thickening throughout the left lung with upper lobe predominance. This may represent contralateral lymphangitic spread of malignancy versus development of pulmonary edema. Interval development of loculations within the known right pleural effusion. Stable moderate in size left pleural effusion. Interval development of abdominal ascites. Stable thickening of the distal esophagus. Electronically Signed   By: Fidela Salisbury M.D.   On: 04/28/2017 19:59   Dg Chest Port 1 View  Result Date: 05/04/2017 CLINICAL DATA:  History of esophageal carcinoma with metastatic involvement of the lungs, recent chemotherapy, now with cardiac issues EXAM: PORTABLE CHEST 1 VIEW COMPARISON:  Portable chest x-ray of 05/02/2016, and CT chest of 04/28/2017 FINDINGS: There are bilateral pleural effusions with mild cardiomegaly and minimal congestion somewhat improved compared to the chest x-ray of 05/02/2017. These findings again are most consistent with mild CHF. There is also parenchymal infiltrate in the right upper lobe  consistent with loculated effusion and known malignancy. Right-sided Port-A-Cath remains with the tip overlying the lower SVC. No bony abnormality is noted. IMPRESSION: 1. Little change in bilateral effusions and some haziness throughout the lungs which may indicate residual  although slightly improved CHF. 2. Parenchymal opacity in the right upper lobe peripherally consistent with loculated effusion and known malignancy when compared to the recent CT of the chest. Electronically Signed   By: Ivar Drape M.D.   On: 05/04/2017 09:44   Dg Chest Port 1 View  Result Date: 05/02/2017 CLINICAL DATA:  Followup pulmonary edema. EXAM: PORTABLE CHEST 1 VIEW COMPARISON:  05/01/2017 FINDINGS: Power port on the right is unchanged with tip at the SVC RA junction. Small pleural effusions persist. Abnormal bilateral lung density which could be due to a combination of edema, collapse and pneumonia persists. This is worse on the right the left. Findings show slight worsening over time. IMPRESSION: Slow worsening bilateral effusions, atelectasis and areas airspace filling right worse left. Electronically Signed   By: Nelson Chimes M.D.   On: 05/02/2017 07:02   Dg Chest Port 1 View  Result Date: 05/01/2017 CLINICAL DATA:  Pleural effusion.  History of esophageal carcinoma EXAM: PORTABLE CHEST 1 VIEW COMPARISON:  Chest radiograph and chest CT April 28, 2017 FINDINGS: There is consolidation in the right upper lobe consistent with mass and associated infiltrate. There is airspace opacification in the left upper lobe which appears slightly more prominent compared to recent studies, felt to most likely represent progression of pneumonia or possibly aspiration in the left upper lobe. There are stable pleural effusions bilaterally with bibasilar atelectatic change. Heart is upper normal in size with pulmonary vascularity within normal limits. Port-A-Cath tip is in the superior vena cava near the cavoatrial junction. No pneumothorax. There  is adenopathy in the right paratracheal region, better seen on recent CT. IMPRESSION: Increase consolidation left upper lobe consistent with progression of pneumonia. Consolidation with mass right upper lobe persists. Pleural effusions bilaterally appears stable with bibasilar atelectatic change. Stable cardiac prominence. Right paratracheal adenopathy persist, better delineated on recent CT. Electronically Signed   By: Lowella Grip III M.D.   On: 05/01/2017 07:47   Dg Chest Port 1 View  Result Date: 04/28/2017 CLINICAL DATA:  Shortness of breath EXAM: PORTABLE CHEST 1 VIEW COMPARISON:  04/24/2017, CT chest 04/21/2017 FINDINGS: Right-sided central venous port tip over the SVC. Small left greater than right pleural effusion, similar on the left slight increase on the right. Progressive consolidation in the right upper lung. Stable cardiomediastinal silhouette. Vascular congestion. Diffuse interstitial opacity as before. IMPRESSION: 1. Similar appearance of left pleural effusion. Slight increased small right pleural effusion 2. Worsening consolidation in the right mid to upper lung which may reflect combination of mass and adjacent pneumonia or inflammatory process 3. Similar appearance of diffuse interstitial disease as compared with recent priors. Electronically Signed   By: Donavan Foil M.D.   On: 04/28/2017 18:28   US Thoracentesis Asp Pleural Space W/img Guide  Result Date: 04/24/2017 INDICATION: Metastatic esophageal cancer, dyspnea, right pleural effusion. Request made for diagnostic and therapeutic right thoracentesis. EXAM: ULTRASOUND GUIDED DIAGNOSTIC AND THERAPEUTIC RIGHT THORACENTESIS MEDICATIONS: None COMPLICATIONS: None immediate. PROCEDURE: An ultrasound guided thoracentesis was thoroughly discussed with the patient and questions answered. The benefits, risks, alternatives and complications were also discussed. The patient understands and wishes to proceed with the procedure. Written  consent was obtained. Ultrasound was performed to localize and mark an adequate pocket of fluid in the right chest. The area was then prepped and draped in the normal sterile fashion. 1% Lidocaine was used for local anesthesia. Under ultrasound guidance a 6 Fr Safe-T-Centesis catheter was introduced. Thoracentesis was performed. The catheter was removed and a  dressing applied. FINDINGS: A total of approximately 2 liters of bloody fluid was removed. Samples were sent to the laboratory as requested by the clinical team. IMPRESSION: Successful ultrasound guided diagnostic and therapeutic right thoracentesis yielding 2 liters of pleural fluid. Dr. Benay Spice was notified of the above findings. Follow-up chest x-ray revealed no pneumothorax. Read by: Rowe Robert, PA-C Electronically Signed   By: Marybelle Killings M.D.   On: 04/24/2017 14:22     CBC Recent Labs  Lab 04/29/17 0300 04/30/17 0341 05/01/17 0630 05/02/17 0655 05/03/17 0434  WBC 17.5* 19.7* 22.7* 22.2* 25.9*  HGB 9.9* 9.4* 10.2* 10.5* 11.3*  HCT 31.0* 30.3* 32.0* 33.2* 35.7*  PLT 288 289 306 313 350  MCV 90.9 92.4 90.9 91.5 92.0  MCH 29.0 28.7 29.0 28.9 29.1  MCHC 31.9 31.0 31.9 31.6 31.7  RDW 18.9* 19.1* 18.8* 18.9* 18.9*    Chemistries  Recent Labs  Lab 04/28/17 2228 04/29/17 0300 04/30/17 0341 05/01/17 0630 05/02/17 0655 05/03/17 0434  NA  --  145 141 141 145 143  K  --  4.1 3.8 3.8 4.2 3.9  CL  --  112* 108 103 111 106  CO2  --  24 25 28 26 25   GLUCOSE  --  137* 140* 128* 134* 120*  BUN  --  20 20 27* 27* 27*  CREATININE  --  0.78 0.54* 0.70 0.57* 0.64  CALCIUM  --  8.9 9.2 9.3 9.2 9.5  MG 1.6* 1.7 1.9 2.2  --  2.2  AST  --   --   --   --  165* 109*  ALT  --   --   --   --  142* 162*  ALKPHOS  --   --   --   --  417* 423*  BILITOT  --   --   --   --  0.6 0.5   ------------------------------------------------------------------------------------------------------------------ No results for input(s): CHOL, HDL, LDLCALC,  TRIG, CHOLHDL, LDLDIRECT in the last 72 hours.  No results found for: HGBA1C ------------------------------------------------------------------------------------------------------------------ No results for input(s): TSH, T4TOTAL, T3FREE, THYROIDAB in the last 72 hours.  Invalid input(s): FREET3 ------------------------------------------------------------------------------------------------------------------ No results for input(s): VITAMINB12, FOLATE, FERRITIN, TIBC, IRON, RETICCTPCT in the last 72 hours.  Coagulation profile No results for input(s): INR, PROTIME in the last 168 hours.  No results for input(s): DDIMER in the last 72 hours.  Cardiac Enzymes No results for input(s): CKMB, TROPONINI, MYOGLOBIN in the last 168 hours.  Invalid input(s): CK ------------------------------------------------------------------------------------------------------------------    Component Value Date/Time   BNP 133.0 (H) 05/01/2017 0630     Roxan Hockey M.D on 05/04/2017 at 7:50 PM  Between 7am to 7pm - Pager - [phone]  After 7pm go to [email or web address] - password [email or web address]  [facility] -  Office  [phone]   Voice Recognition Viviann Spare dictation system was used to create this note, attempts have been made to correct errors. Please contact the author with questions and/or clarifications.

## 2017-05-05 DIAGNOSIS — J189 Pneumonia, unspecified organism: Secondary | ICD-10-CM

## 2017-05-05 MED ORDER — GUAIFENESIN ER 600 MG PO TB12: 600.0000 mg | ORAL_TABLET | Freq: Two times a day (BID) | ORAL | 0 refills | Status: AC

## 2017-05-05 MED ORDER — ACETAMINOPHEN 500 MG PO TABS: 1000.0000 mg | ORAL_TABLET | Freq: Four times a day (QID) | ORAL | 0 refills | Status: AC | PRN

## 2017-05-05 MED ORDER — DILTIAZEM HCL ER COATED BEADS 120 MG PO CP24: 120.0000 mg | ORAL_CAPSULE | Freq: Every day | ORAL | 2 refills | Status: DC

## 2017-05-05 MED ORDER — PREDNISONE 10 MG PO TABS: ORAL_TABLET | ORAL | 0 refills | Status: DC

## 2017-05-05 MED ORDER — HEPARIN SOD (PORK) LOCK FLUSH 100 UNIT/ML IV SOLN
500.0000 [IU] | INTRAVENOUS | Status: AC | PRN
  Administered 2017-05-05: 500 [IU]

## 2017-05-05 MED ORDER — AMIODARONE HCL 200 MG PO TABS: ORAL_TABLET | ORAL | 1 refills | Status: AC

## 2017-05-05 NOTE — Discharge Summary (Signed)
Grant Todd, is a 62 y.o. male  DOB November 08, 1955  [name] [id number].  Admission date:  04/28/2017  Admitting Physician  Kandice Hams, MD  Discharge Date:  05/05/2017   Primary MD  [name], MD  Recommendations for primary care physician for things to follow:   1)Continue Home Oxygen via Nasal Canula..... 2)Take Cardizem and Amiodarone as ordered 3)Follow up with Dr Benay Spice on 05/10/17 or as Previously scheduled 4)Low salt Diet as advised   Admission Diagnosis  Metastatic cancer ([place]) [C79.9] Atrial tachycardia ([place]) [I47.1] COPD exacerbation ([place]) [J44.1] HCAP (healthcare-associated pneumonia) [J18.9] Sepsis, due to unspecified organism [facility]) [A41.9] Chronic pleural effusion [J90]   Discharge Diagnosis  Metastatic cancer ([place]) [C79.9] Atrial tachycardia ([place]) [I47.1] COPD exacerbation ([place]) [J44.1] HCAP (healthcare-associated pneumonia) [J18.9] Sepsis, due to unspecified organism ([place]) [A41.9] Chronic pleural effusion [J90]    Active Problems:   Chronic pleural effusion   Sepsis ([place])   Atrial tachycardia ([place])   HCAP (healthcare-associated pneumonia)   Atrial fibrillation with RVR (HCC)   Paroxysmal atrial fibrillation ([place])      Past Medical History:  Diagnosis Date  . Elevated LFTs   . Esophageal mass   . Pleural effusion on left   . Posterior tibial tendon dysfunction, right   . Rheumatoid arthritis ([place])   . Seasonal allergic rhinitis   . Severe esophageal dysplasia   . Tobacco use     Past Surgical History:  Procedure Laterality Date  . CHOLECYSTECTOMY    . IR FLUORO GUIDE PORT INSERTION RIGHT  01/04/2017  . IR US GUIDE BX ASP/DRAIN  01/04/2017  . IR US GUIDE VASC ACCESS RIGHT  01/04/2017  . TONSILLECTOMY         HPI  from the history and physical done on the day of admission:   History of Present Illness: Grant Todd is a 62 y.o. 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   He was seen by his pulmonologist of Friday and his O2 sats were in the 80s. He also had tachycardia so he referred to the ED.   Pt has been getting short of breath esp on exertion over the last several weeks and although he received a thoracentesis on Monday it did not help much.  He is currently on BiPAP and states that his breathing has improved some.  His WBC is currently elevated at 17.5 (down from 22.1) possible secondary to ost Obstructive Pneumonia? Or from spontaneous bacterial peritonitis.  He is currently being covered with broad spectrum abx Vancomycin and Cefepime   Hospital Course:    Active Problems:   Chronic pleural effusion   Sepsis (HCC)   Atrial tachycardia (HCC)   HCAP (healthcare-associated pneumonia)   Atrial fibrillation with RVR [name])  Brief Narrative: 62 yr old male with metastatic esophageal cancer s/p 8 cycles of FOLCOX and  recently started on pembrolizumab on 04/26/17 s/p thoracentesis of right pleural effusion on 04/24/17 presents with sob and tachycardia. He was admitted for acute on chronic respiratory failure with hypoxia from post obstructive pneumonia. He was admitted to PCCm service and transferred to [facility] ON 4/1. Overnight pt has remained in afib with RVR in 150's with minimal response to cardizem . Cardiology consulted this am and we started him on IV amiodarone. patient has been on IV amiodarone and digoxin was also started, but unfortunately his rate has been in high 120's to 150's the last 24 hours.  Echocardiogram does not show any significant abnormalities.  62 yo male with metastatic esophageal cancer tx with FOLFOX, started on pembrolizumab 04/26/17 presented with dyspnea. Found  to have lymphangitic spread, malignant Rt pleural effusion, A fib with RVR. PMHx of RA, chemo induced neuropathy, chronic Lt pleural effusion.   Plan:- 1)Acute chronic respiratoryfailure with hypoxia secondary to health care associated pneumonia, malignant effusion, and progression of the metastatic disease in the lung.    Much improved, oxygen is down to 2-3 L/min,   PCCM input appreciated, last dose of antibiotics 05/04/2017 (patient was on cefepime and vancomycin from 04/28/2017 through 05/04/2017), prior to admission patient was on oxygen at home    2)Atrial fibrillation with RVR:- Rate control much improved on p.o. amiodarone and Cardizem, probably driven by lung disease and dehydration.  Patient initially  Required IV amiodarone and digoxin for rate control   3)Metastatic adeno ca of esophagus with mets: -Poorly differentiated, discussed with Dr. Learta Codding from oncology service will hold off on chemotherapy during this hospital stay, follow-up with Dr. Benay Spice next week to discuss further oncology management  4) acute on chronic anemia in the setting of underlying malignancy--- hemoglobin is currently above 10, no evidence of bleeding  5)Malignant Rt pleural effusion.-Chronic Lt pleural effusion, - f/u CXR as outpt and then determine if he should consider pleurx >> doesn't need at present    Code Status:DNR Family Communication:Wife at bedsidetoday.  Disposition Plan:p discharge home   Consultants:  PCCM,  oncology  Cardiology.    Procedures:echocardiogram   Antimicrobials:  Cefepime Vancomycin.   Discharge Condition: Stable  Follow UP  Follow-up Information    Magdalen Spatz, NP Follow up on 05/22/2017.   Specialty:  Pulmonary Disease Why:  Follow up with lung doctors at 9:15 AM.  You will need a chest xray the morning of your appointment.  Contact information: [address] [name] [address] [phone]           Diet and  Activity recommendation:  As advised  Discharge Instructions     Discharge Instructions    (HEART FAILURE PATIENTS) Call MD:  Anytime you have any of the following symptoms: 1) 3 pound weight gain in 24 hours or 5 pounds in 1 week 2) shortness of breath, with or without a dry hacking cough 3) swelling in the hands, feet or stomach 4) if you have to sleep on extra pillows at night in order to breathe.   Complete by:  As directed    Call MD for:  difficulty breathing, headache or visual disturbances   Complete by:  As directed    Call MD for:  persistant dizziness or light-headedness   Complete by:  As directed    Call MD for:  persistant nausea and vomiting   Complete by:  As directed    Call MD for:  temperature >100.4   Complete by:  As  directed    Diet - low sodium heart healthy   Complete by:  As directed    Discharge instructions   Complete by:  As directed    1)Continue Home Oxygen via Nasal Canula..... 2)Take Cardizem and Amiodarone as ordered 3)Follow up with Dr Benay Spice on 05/10/17 or as Previously scheduled 4)Low salt Diet as advised   Increase activity slowly   Complete by:  As directed         Discharge Medications     Allergies as of 05/05/2017      Reactions   Chantix [varenicline] Other (See Comments)   Vivid dreams and lethargic   Methotrexate Derivatives    Caused pleural effusions   Shellfish Allergy Hives   Sulfonamide Derivatives Rash      Medication List    STOP taking these medications   pseudoephedrine-guaifenesin 60-600 MG 12 hr tablet Commonly known as:  MUCINEX D     TAKE these medications   acetaminophen 500 MG tablet Commonly known as:  TYLENOL Take 2 tablets (1,000 mg total) by mouth every 6 (six) hours as needed for mild pain, fever or headache.   amiodarone 200 MG tablet Commonly known as:  PACERONE Take 2 Tabs (400 mg) twice a day for 7 days, then 200 mg twice a day for 1 week, then 200 mg daily after that   diltiazem 120 MG 24 hr  capsule Commonly known as:  CARDIZEM CD Take 1 capsule (120 mg total) by mouth daily.   guaiFENesin 600 MG 12 hr tablet Commonly known as:  MUCINEX Take 1 tablet (600 mg total) by mouth 2 (two) times daily for 10 days.   HYDROcodone-acetaminophen 5-325 MG tablet Commonly known as:  NORCO/VICODIN Take 1 tablet by mouth every 4 (four) hours as needed for moderate pain or severe pain.   lidocaine-prilocaine cream Commonly known as:  EMLA Apply 1 application topically as needed. Apply a small amount of emla cream on a cotton ball and place cream over port site 1-2 hours prior to treatmetn. Do not rub in . Cover with plastic wrap   morphine 15 MG 12 hr tablet Commonly known as:  MS CONTIN Take 1 tablet (15 mg total) by mouth every 12 (twelve) hours. What changed:  when to take this   pantoprazole 40 MG tablet Commonly known as:  PROTONIX TAKE ONE (1) TABLET BY MOUTH EVERY DAY   predniSONE 10 MG tablet Commonly known as:  DELTASONE Take 20 mg daily for 4 days, then 10 mg daily for 4 days, then back to 5 mg daily indefinitely What changed:    medication strength  how much to take  how to take this  when to take this  additional instructions       Major procedures and Radiology Reports - PLEASE review detailed and final reports for all details, in brief -    Dg Chest 1 View  Result Date: 04/24/2017 CLINICAL DATA:  Status post thoracentesis, history of esophageal cancer EXAM: CHEST  1 VIEW COMPARISON:  CT chest 04/21/2017, radiograph 04/12/2017 FINDINGS: Small bilateral pleural effusions. Right-sided central venous port tip overlies SVC. No pneumothorax identified. Masslike consolidation in the right upper lobe. Patchy infiltrate in the right peripheral lower lung. Stable cardiomediastinal silhouette. No pneumothorax. IMPRESSION: 1. Small right pleural effusion. Similar appearance of left pleural effusion or thickening. No pneumothorax seen. 2. Slight increased interstitial  opacity in the right thorax. Similar appearance of masslike opacity in the right upper lobe with increased patchy infiltrate  in the peripheral right lower lung. Electronically Signed   By: Donavan Foil M.D.   On: 04/24/2017 14:24   Dg Chest 2 View  Result Date: 04/12/2017 CLINICAL DATA:  Persistent cough. EXAM: CHEST - 2 VIEW COMPARISON:  CT chest 03/13/2017. most recent chest x-ray is from 09/14/2011. FINDINGS: Similar appearance of left chronic pleural effusion. Relatively diffuse airspace disease noted in the right mid and upper lung, difficult to directly compare to CT scan from 1 month ago, but likely similar. Heart size upper normal. Right Port-A-Cath tip overlies the distal SVC. The visualized bony structures of the thorax are intact. IMPRESSION: Diffuse airspace disease right lung, similar to prior chest CT. Chronic left pleural effusion. Electronically Signed   By: Misty Stanley M.D.   On: 04/12/2017 11:01   Ct Chest Wo Contrast  Result Date: 04/22/2017 CLINICAL DATA:  Esophageal cancer, diagnosed 12/15/2016, chemotherapy ongoing EXAM: CT CHEST WITHOUT CONTRAST TECHNIQUE: Multidetector CT imaging of the chest was performed following the standard protocol without IV contrast. COMPARISON:  03/13/2016 FINDINGS: Cardiovascular: Heart is normal in size.  No pericardial effusion. No evidence of thoracic aortic aneurysm. Very mild atherosclerotic calcifications of the aortic arch. Mild coronary atherosclerosis of the LAD (series 2/image 89). Right chest port terminates in the lower SVC. Mediastinum/Nodes: Mild progression of thoracic lymphadenopathy, including: --9 mm short axis left supraclavicular node (series 2/image 15), new --2.3 cm short axis right paratracheal node (series 2/image 57), unchanged --1.5 cm short axis AP window node (series 2/image 9), previously 1.3 cm --1.8 cm short axis right hilar node (series 2/image 69), previously 1.6 cm --1.6 cm short axis subcarinal node (series 2/image 35),  previously 1.4 cm --11 mm short axis lower paraesophageal node (series 2/image 110), new Equivocal wall thickening involving the distal esophagus just above the GE junction (sagittal image 112). Visualized thyroid is grossly unremarkable. Lungs/Pleura: 5.5 x 5.0 cm mass-like opacity in the right upper lobe (series 5/image 51), reflecting progression from prior CT. This appearance is worrisome for tumor. Surrounding increased interstitial markings in the right upper lobe suggests lymphangitic spread (series 5/image 60). 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 Moderate right pleural effusion, new. Associated mild right basilar atelectasis. Small left pleural effusion, grossly unchanged. No pneumothorax. Upper Abdomen: Visualized upper abdomen is notable for a 1.5 cm hepatic lesion inferiorly in segment 5 (series 2/image 134), grossly unchanged but incompletely visualized. Left para-aortic lymphadenopathy measuring up to 1.2 cm short axis (series 2/image 134), previously 11 mm. Musculoskeletal: Visualized osseous structures are within normal limits. IMPRESSION: Equivocal wall thickening involving the distal esophagus just above the GE junction, possibly reflecting the site of the patient's treated primary tumor. Progression of thoracic and upper abdominal nodal metastases, as above. 5.5 cm masslike opacity in the right upper lobe, progressed, worrisome for tumor. Associated lymphangitic spread in the right lung. Moderate right pleural effusion, new. Small left pleural effusion, grossly unchanged. 15 mm hepatic metastasis, grossly unchanged but incompletely visualized. Additional known hepatic metastasis is not imaged. Aortic Atherosclerosis (ICD10-I70.0). Electronically Signed   By: Julian Hy M.D.   On: 04/22/2017 09:20   Ct Angio Chest Pe W And/or Wo Contrast  Result Date:  04/28/2017 CLINICAL DATA:  Esophageal cancer. Ongoing chemotherapy. Patient complains of shortness breath. EXAM: CT ANGIOGRAPHY CHEST WITH CONTRAST TECHNIQUE: Multidetector CT imaging of the chest was performed using the standard protocol during bolus administration of intravenous contrast. Multiplanar  CT image reconstructions and MIPs were obtained to evaluate the vascular anatomy. CONTRAST:  130mL ISOVUE-370 IOPAMIDOL (ISOVUE-370) INJECTION 76% COMPARISON:  04/21/2017 FINDINGS: Cardiovascular: Satisfactory opacification of the pulmonary arteries to the segmental level. No evidence of pulmonary embolism. Mildly enlarged heart. Tiny pericardial effusion. Mild coronary artery disease. Central venous catheter terminates in the distal superior vena cava. Mediastinum/Nodes: Enlarged bilateral mediastinal lymph nodes are not significantly changed with the largest node in right pretracheal location measuring 1.9 cm. Again seen is circumferential thickening of the distal esophagus just above the GE junction. The thyroid is grossly unremarkable. Lungs/Pleura: Right upper lobe pulmonary mass appears more fragmented and measures approximately 4.4 x 5.1 cm. There is subpleural soft tissue thickening extending inferiorly along the anterior pleura of the right upper lobe and right middle lobe, likely representing extension of disease. There is persistent interstitial thickening in the right upper lobe and right middle lobe suggestive of lymphangitic spread of disease. Interval development of interstitial thickening in the left lung, with upper lobe predominance. Stable moderate in size left pleural effusion. The right pleural effusion demonstrates loculations. Upper Abdomen: Interval development of water density pleural effusion, moderate. Musculoskeletal: No chest wall abnormality. No acute or significant osseous findings. Review of the MIP images confirms the above findings. IMPRESSION: No evidence of pulmonary embolus.  Previously demonstrated right upper lobe malignancy appears more fragmented and measures slightly smaller at 5.1 cm in greatest dimension. Progression of lymphangitic spread of disease in the right upper and right middle lobes. Interval development of interstitial thickening throughout the left lung with upper lobe predominance. This may represent contralateral lymphangitic spread of malignancy versus development of pulmonary edema. Interval development of loculations within the known right pleural effusion. Stable moderate in size left pleural effusion. Interval development of abdominal ascites. Stable thickening of the distal esophagus. Electronically Signed   By: Fidela Salisbury M.D.   On: 04/28/2017 19:59   Dg Chest Port 1 View  Result Date: 05/04/2017 CLINICAL DATA:  History of esophageal carcinoma with metastatic involvement of the lungs, recent chemotherapy, now with cardiac issues EXAM: PORTABLE CHEST 1 VIEW COMPARISON:  Portable chest x-ray of 05/02/2016, and CT chest of 04/28/2017 FINDINGS: There are bilateral pleural effusions with mild cardiomegaly and minimal congestion somewhat improved compared to the chest x-ray of 05/02/2017. These findings again are most consistent with mild CHF. There is also parenchymal infiltrate in the right upper lobe consistent with loculated effusion and known malignancy. Right-sided Port-A-Cath remains with the tip overlying the lower SVC. No bony abnormality is noted. IMPRESSION: 1. Little change in bilateral effusions and some haziness throughout the lungs which may indicate residual although slightly improved CHF. 2. Parenchymal opacity in the right upper lobe peripherally consistent with loculated effusion and known malignancy when compared to the recent CT of the chest. Electronically Signed   By: Ivar Drape M.D.   On: 05/04/2017 09:44   Dg Chest Port 1 View  Result Date: 05/02/2017 CLINICAL DATA:  Followup pulmonary edema. EXAM: PORTABLE CHEST 1 VIEW  COMPARISON:  05/01/2017 FINDINGS: Power port on the right is unchanged with tip at the SVC RA junction. Small pleural effusions persist. Abnormal bilateral lung density which could be due to a combination of edema, collapse and pneumonia persists. This is worse on the right the left. Findings show slight worsening over time. IMPRESSION: Slow worsening bilateral effusions, atelectasis and areas airspace filling right worse left. Electronically Signed   By: Nelson Chimes M.D.   On: 05/02/2017 07:02  Dg Chest Port 1 View  Result Date: 05/01/2017 CLINICAL DATA:  Pleural effusion.  History of esophageal carcinoma EXAM: PORTABLE CHEST 1 VIEW COMPARISON:  Chest radiograph and chest CT April 28, 2017 FINDINGS: There is consolidation in the right upper lobe consistent with mass and associated infiltrate. There is airspace opacification in the left upper lobe which appears slightly more prominent compared to recent studies, felt to most likely represent progression of pneumonia or possibly aspiration in the left upper lobe. There are stable pleural effusions bilaterally with bibasilar atelectatic change. Heart is upper normal in size with pulmonary vascularity within normal limits. Port-A-Cath tip is in the superior vena cava near the cavoatrial junction. No pneumothorax. There is adenopathy in the right paratracheal region, better seen on recent CT. IMPRESSION: Increase consolidation left upper lobe consistent with progression of pneumonia. Consolidation with mass right upper lobe persists. Pleural effusions bilaterally appears stable with bibasilar atelectatic change. Stable cardiac prominence. Right paratracheal adenopathy persist, better delineated on recent CT. Electronically Signed   By: Lowella Grip III M.D.   On: 05/01/2017 07:47   Dg Chest Port 1 View  Result Date: 04/28/2017 CLINICAL DATA:  Shortness of breath EXAM: PORTABLE CHEST 1 VIEW COMPARISON:  04/24/2017, CT chest 04/21/2017 FINDINGS: Right-sided  central venous port tip over the SVC. Small left greater than right pleural effusion, similar on the left slight increase on the right. Progressive consolidation in the right upper lung. Stable cardiomediastinal silhouette. Vascular congestion. Diffuse interstitial opacity as before. IMPRESSION: 1. Similar appearance of left pleural effusion. Slight increased small right pleural effusion 2. Worsening consolidation in the right mid to upper lung which may reflect combination of mass and adjacent pneumonia or inflammatory process 3. Similar appearance of diffuse interstitial disease as compared with recent priors. Electronically Signed   By: Donavan Foil M.D.   On: 04/28/2017 18:28   US Thoracentesis Asp Pleural Space W/img Guide  Result Date: 04/24/2017 INDICATION: Metastatic esophageal cancer, dyspnea, right pleural effusion. Request made for diagnostic and therapeutic right thoracentesis. EXAM: ULTRASOUND GUIDED DIAGNOSTIC AND THERAPEUTIC RIGHT THORACENTESIS MEDICATIONS: None COMPLICATIONS: None immediate. PROCEDURE: An ultrasound guided thoracentesis was thoroughly discussed with the patient and questions answered. The benefits, risks, alternatives and complications were also discussed. The patient understands and wishes to proceed with the procedure. Written consent was obtained. Ultrasound was performed to localize and mark an adequate pocket of fluid in the right chest. The area was then prepped and draped in the normal sterile fashion. 1% Lidocaine was used for local anesthesia. Under ultrasound guidance a 6 Fr Safe-T-Centesis catheter was introduced. Thoracentesis was performed. The catheter was removed and a dressing applied. FINDINGS: A total of approximately 2 liters of bloody fluid was removed. Samples were sent to the laboratory as requested by the clinical team. IMPRESSION: Successful ultrasound guided diagnostic and therapeutic right thoracentesis yielding 2 liters of pleural fluid. Dr. Benay Spice  was notified of the above findings. Follow-up chest x-ray revealed no pneumothorax. Read by: Rowe Robert, PA-C Electronically Signed   By: Marybelle Killings M.D.   On: 04/24/2017 14:22    Micro Results    Recent Results (from the past 240 hour(s))  Blood culture (routine x 2)     Status: None   Collection Time: 04/28/17  7:10 PM  Result Value Ref Range Status   Specimen Description   Final    BLOOD LEFT ANTECUBITAL Performed at [facility] [address], [place], [place] [address]    Special Requests  Final    BOTTLES DRAWN AEROBIC AND ANAEROBIC Blood Culture adequate volume Performed at [facility] [address], [place], [place] [address]    Culture   Final    NO GROWTH 5 DAYS Performed at [facility], [place] [address], [place], [place] [address]    Report Status 05/03/2017 FINAL  Final  Blood culture (routine x 2)     Status: None   Collection Time: 04/28/17  9:00 PM  Result Value Ref Range Status   Specimen Description   Final    BLOOD RIGHT ANTECUBITAL Performed at [facility] [address] [place], [place] [address]    Special Requests   Final    BOTTLES DRAWN AEROBIC AND ANAEROBIC Blood Culture adequate volume Performed at [facility] [address], [place], [place] [address]    Culture   Final    NO GROWTH 5 DAYS Performed at [facility], [place] [address] [place], [place] [address]    Report Status 05/04/2017 FINAL  Final  Respiratory Panel by PCR     Status: None   Collection Time: 04/29/17  1:17 AM  Result Value Ref Range Status   Adenovirus NOT DETECTED NOT DETECTED Final   Coronavirus 229E NOT DETECTED NOT DETECTED Final   Coronavirus HKU1 NOT DETECTED NOT DETECTED Final   Coronavirus NL63 NOT DETECTED NOT DETECTED Final   Coronavirus OC43 NOT DETECTED NOT DETECTED Final   Metapneumovirus NOT DETECTED NOT DETECTED Final   Rhinovirus / Enterovirus NOT DETECTED  NOT DETECTED Final   Influenza A NOT DETECTED NOT DETECTED Final   Influenza B NOT DETECTED NOT DETECTED Final   Parainfluenza Virus 1 NOT DETECTED NOT DETECTED Final   Parainfluenza Virus 2 NOT DETECTED NOT DETECTED Final   Parainfluenza Virus 3 NOT DETECTED NOT DETECTED Final   Parainfluenza Virus 4 NOT DETECTED NOT DETECTED Final   Respiratory Syncytial Virus NOT DETECTED NOT DETECTED Final   Bordetella pertussis NOT DETECTED NOT DETECTED Final   Chlamydophila pneumoniae NOT DETECTED NOT DETECTED Final   Mycoplasma pneumoniae NOT DETECTED NOT DETECTED Final    Comment: Performed at [facility], [place] [address] [place], [place] [address]  MRSA PCR Screening     Status: None   Collection Time: 04/29/17  1:17 AM  Result Value Ref Range Status   MRSA by PCR NEGATIVE NEGATIVE Final    Comment:        [facility] MRSA Assay (FDA approved for NASAL specimens only), is one component of a comprehensive MRSA colonization surveillance program. It is not intended to diagnose MRSA infection nor to guide or monitor treatment for MRSA infections. Performed at [facility], [place] [address], [place], [place] [address]        Today   Subjective    Lyon Dumont today has no new complaints, wife at bedside, patient and wife eager to go home, respiratory status has improved significantly          Patient has been seen and examined prior to discharge   Objective   Blood pressure 121/61, pulse 94, temperature 98.1 F (36.7 C), temperature source Oral, resp. rate (!) 24, height 5\' 8"  (1.727 m), weight 82.9 kg (182 lb 12.2 oz), SpO2 97 %.   Intake/Output Summary (Last 24 hours) at 05/05/2017 1240 Last data filed at 05/05/2017 1046 Gross per 24 hour  Intake 230 ml  Output 301 ml  Net -71 ml  Exam  Gen:- Awake Alert,  In no apparent distress, speaking in sentences  HEENT:- [place].AT, No sclera icterus Nose- [place] 3 L/min Neck-Supple Neck,No JVD,.   Lungs-diminished in bases with scattered wheezes  CV- S1, S2 normal, irregular, right-sided Port-A-Cath site is clean dry and intact Abd-  +ve B.Sounds, Abd Soft, No tenderness,    Extremity/Skin:-good pulses, skin is warm and dry  psych-affect is appropriate, oriented x3 Neuro-no new focal deficits, no tremors   Data Review   CBC w Diff:  Lab Results  Component Value Date   WBC 25.9 (H) 05/03/2017   HGB 11.3 (L) 05/03/2017   HGB 9.8 (L) 02/01/2017   HCT 35.7 (L) 05/03/2017   HCT 29.9 (L) 02/01/2017   PLT 350 05/03/2017   PLT 286 04/26/2017   PLT 325 02/01/2017   LYMPHOPCT 10 04/26/2017   LYMPHOPCT 6.0 (L) 02/01/2017   MONOPCT 10 04/26/2017   MONOPCT 8.3 02/01/2017   EOSPCT 1 04/26/2017   EOSPCT 0.6 02/01/2017   BASOPCT 0 04/26/2017   BASOPCT 0.9 02/01/2017    CMP:  Lab Results  Component Value Date   NA 143 05/03/2017   NA 139 02/01/2017   K 3.9 05/03/2017   K 3.6 02/01/2017   CL 106 05/03/2017   CO2 25 05/03/2017   CO2 25 02/01/2017   BUN 27 (H) 05/03/2017   BUN 18.3 02/01/2017   CREATININE 0.64 05/03/2017   CREATININE 0.81 04/26/2017   CREATININE 0.7 02/01/2017   PROT 6.5 05/03/2017   PROT 6.0 (L) 02/01/2017   ALBUMIN 2.2 (L) 05/03/2017   ALBUMIN 2.3 (L) 02/01/2017   BILITOT 0.5 05/03/2017   BILITOT 0.5 04/26/2017   BILITOT 0.23 02/01/2017   ALKPHOS 423 (H) 05/03/2017   ALKPHOS 308 (H) 02/01/2017   AST 109 (H) 05/03/2017   AST 43 (H) 04/26/2017   AST 31 02/01/2017   ALT 162 (H) 05/03/2017   ALT 43 04/26/2017   ALT 41 02/01/2017  .   Total Discharge time is about 33 minutes  Roxan Hockey M.D on 05/05/2017 at 12:40 PM  [facility]   Office  [phone]  Voice Recognition Viviann Spare dictation system was used to create this note, attempts have been made to correct errors. Please contact the author with questions and/or clarifications.

## 2017-05-05 NOTE — Progress Notes (Signed)
..  Name: Grant Todd [name]: [id number] DOB: 1956/01/31    ADMISSION DATE:  04/28/2017 CONSULTATION DATE:  04/28/2017  REFERRING MD : Dr. Laurann Montana  CHIEF COMPLAINT: short of breath  BRIEF PATIENT DESCRIPTION:  62 yo male with metastatic esophageal cancer tx with FOLFOX, started on pembrolizumab 04/26/17 presented with dyspnea.  Found to have lymphangitic spread, malignant Rt pleural effusion, A fib with RVR.  PMHx of RA, chemo induced neuropathy, chronic Lt pleural effusion.  SUBJECTIVE:  Feels better.  Anxious to go home.  Off all IV medications.  VITAL SIGNS: BP 121/61   Pulse 94   Temp 98.1 F (36.7 C) (Oral)   Resp (!) 24   Ht 5\' 8"  (1.727 m)   Wt 182 lb 12.2 oz (82.9 kg)   SpO2 97%   BMI 27.79 kg/m   INTAKE/OUTPUT: I/O last 3 completed shifts: In: 1037.6 [I.V.:337.6; IV Piggyback:700] Out: 300 [Urine:300]  PHYSICAL EXAMINATION:  General - pleasant Eyes - pupils reactive ENT - no sinus tenderness, no oral exudate, no LAN Cardiac - irregular, no murmur Chest - decreased BS at bases Abd - soft, non tender Ext - no edema Skin - no rashes Neuro - normal strength Psych - normal mood    LABS:  CBC Recent Labs    05/03/17 0434  WBC 25.9*  HGB 11.3*  HCT 35.7*  PLT 350    BMET Recent Labs    05/03/17 0434  NA 143  K 3.9  CL 106  CO2 25  BUN 27*  CREATININE 0.64  GLUCOSE 120*    Electrolytes Recent Labs    05/03/17 0434  CALCIUM 9.5  MG 2.2    Liver Enzymes Recent Labs    05/03/17 0434  AST 109*  ALT 162*  ALKPHOS 423*  BILITOT 0.5  ALBUMIN 2.2*    Imaging Dg Chest Port 1 View  Result Date: 05/04/2017 CLINICAL DATA:  History of esophageal carcinoma with metastatic involvement of the lungs, recent chemotherapy, now with cardiac issues EXAM: PORTABLE CHEST 1 VIEW COMPARISON:  Portable chest x-ray of 05/02/2016, and CT chest of 04/28/2017 FINDINGS: There are bilateral pleural effusions with mild cardiomegaly and minimal congestion  somewhat improved compared to the chest x-ray of 05/02/2017. These findings again are most consistent with mild CHF. There is also parenchymal infiltrate in the right upper lobe consistent with loculated effusion and known malignancy. Right-sided Port-A-Cath remains with the tip overlying the lower SVC. No bony abnormality is noted. IMPRESSION: 1. Little change in bilateral effusions and some haziness throughout the lungs which may indicate residual although slightly improved CHF. 2. Parenchymal opacity in the right upper lobe peripherally consistent with loculated effusion and known malignancy when compared to the recent CT of the chest. Electronically Signed   By: Ivar Drape M.D.   On: 05/04/2017 09:44    STUDIES: Rt thoracentesis 3/25 >> 2 liters fluid removed; adenocarcinoma CT chest 3/29 >> mild CAD, b/l enlarged LAN, 5.1 cm RUL mass, interstitial thickening Rt upper and middle lobes, interstitial thickening Lt lung, stable moderate Lt effusion, rt effusion with some loculations  CULTURES: Blood 3/29 >> negative Respiratory viral panel 3/30 >> negative  ANTIBIOTICS: Vancomycin 3/29 >> 4/04 Cefepime 3/29 >> 4/04  EVENTS: 3/29 Admit, start cardizem 3/31 Oncology consulted 4/02 Cardiology consulted, start amiodarone; start solumedrol 4/04 Change to prednisone  DISCUSSION: Main issue appears to be lymphangitic spread of esophageal cancer with malignant effusion on the Rt.  CXR is stable.  He had improvement in respiratory  status, and this could be related to pembrolizumab taking effect.  A fib with RVR seems to be driven by hypoxia, and has improved.  Transitioned to oral medications.  O2 needs have improved.  ASSESSMENT / PLAN:  Acute hypoxic respiratory failure. - oxygen to keep SpO2 > 90% - will need to arrange for home oxygen - will reassess status as outpt  HCAP. - completed ABx 4/04  Malignant Rt pleural effusion. Chronic Lt pleural effusion. - f/u CXR as outpt and then  determine if he should consider pleurx >> doesn't need at present  Metastatic esophageal cancer with lymphangitic spread. - wean off prednisone over next 1 week - f/u with oncology as outpt >> if he doesn't improve further after recent tx with pembrolizumab, then will need taxol  A fib with RVR. - per cardiology  DVT prophylaxis - SQ heparin SUP - Protonix Nutrition - regular diet Goals of care - DNR  Updated pt's family at bedside.  Have scheduled outpt pulmonary follow up with Eric Form on 05/22/17 at 9:15 AM.  Please call if further assistance needed while he is in hospital.  Chesley Mires, MD [place] 05/05/2017, 10:49 AM

## 2017-05-05 NOTE — Progress Notes (Signed)
Patient verbalized understanding of discharge instructions. Spouse is at the bedside. Patient is stable at discharge.

## 2017-05-05 NOTE — Care Management Note (Signed)
Case Management Note  Patient Details  Name: Grant Todd [name]: [id number] Date of Birth: [birth year]  Subjective/Objective:                    Action/Plan:d/c home.   Expected Discharge Date:  05/05/17               Expected Discharge Plan:  Home/Self Care  In-House Referral:     Discharge planning Services  CM Consult  Post Acute Care Choice:    Choice offered to:     DME Arranged:    DME Agency:     HH Arranged:    HH Agency:     Status of Service:  Completed, signed off  If discussed at [employer] of Stay Meetings, dates discussed:    Additional Comments:  Dessa Phi, RN 05/05/2017, 12:51 PM

## 2017-05-05 NOTE — Discharge Instructions (Signed)
1)Continue Home Oxygen via Nasal Canula..... 2)Take Cardizem and Amiodarone as ordered 3)Follow up with Dr Benay Spice on 05/10/17 or as Previously scheduled 4)Low salt Diet as advised

## 2017-05-05 NOTE — Progress Notes (Signed)
IP PROGRESS NOTE  Subjective:   Grant Todd wants to go home.  Pain is relieved with Tylenol.  His wife is in the room this morning.  He is up in a chair.  Objective: Vital signs in last 24 hours: Blood pressure 136/65, pulse 85, temperature 98 F (36.7 C), temperature source Oral, resp. rate 18, height '5\' 8"'$  (1.727 m), weight 182 lb 12.2 oz (82.9 kg), SpO2 96 %.  Intake/Output from previous day: 04/04 0701 - 04/05 0700 In: 420 [I.V.:120; IV Piggyback:300] Out: 100 [Urine:100]  Physical Exam:  HEENT: No thrush Lungs: Inspiratory rub throughout the left chest, good air movement bilaterally, no respiratory distress Cardiac: Irregular Abdomen: Soft and nontender, no hepatomegaly Extremities: 1+ lower leg edema bilaterally   Portacath/PICC-without erythema  Lab Results: Recent Labs    05/03/17 0434  WBC 25.9*  HGB 11.3*  HCT 35.7*  PLT 350    BMET Recent Labs    05/03/17 0434  NA 143  K 3.9  CL 106  CO2 25  GLUCOSE 120*  BUN 27*  CREATININE 0.64  CALCIUM 9.5    Lab Results  Component Value Date   CEA1 1,045.42 (H) 04/12/2017    Studies/Results: Dg Chest Port 1 View  Result Date: 05/04/2017 CLINICAL DATA:  History of esophageal carcinoma with metastatic involvement of the lungs, recent chemotherapy, now with cardiac issues EXAM: PORTABLE CHEST 1 VIEW COMPARISON:  Portable chest x-ray of 05/02/2016, and CT chest of 04/28/2017 FINDINGS: There are bilateral pleural effusions with mild cardiomegaly and minimal congestion somewhat improved compared to the chest x-ray of 05/02/2017. These findings again are most consistent with mild CHF. There is also parenchymal infiltrate in the right upper lobe consistent with loculated effusion and known malignancy. Right-sided Port-A-Cath remains with the tip overlying the lower SVC. No bony abnormality is noted. IMPRESSION: 1. Little change in bilateral effusions and some haziness throughout the lungs which may indicate residual  although slightly improved CHF. 2. Parenchymal opacity in the right upper lobe peripherally consistent with loculated effusion and known malignancy when compared to the recent CT of the chest. Electronically Signed   By: Ivar Drape M.D.   On: 05/04/2017 09:44    Medications: I have reviewed the patient's current medications.  Assessment/Plan: 1. Metastatic esophagus cancer ? Distal esophagus mass at upper endoscopy 12/15/2016, biopsy confirmed invasive poorly differentiated adenocarcinoma ? CTs of 02/16/2016-distal esophagus thickening, mediastinal/right hilar lymphadenopathy, abdominal/retroperitoneal lymphadenopathy, liver metastases ? MRI lumbar spine 12/20/2016-7 mm S1 lesion suspicious for metastasis, bulky retroperitoneal and prevertebral malignant lymphadenopathy, leftpsoasmuscle metastasis, degenerative disease causing severe neural foraminal stenosis at right L4 and left L5 ? PET scan 12/27/2016-hypermetabolic mass at the gastroesophageal junction; extensive hypermetabolic adenopathy involving the right supraclavicular nodal station, mediastinal lymph nodes, retrocrural lymph nodes, periaortic lymph nodes, left iliac lymph nodes and central mesenteric lymph nodes; fine nodularity in the right upper lobe with hypermetabolic thickening in the right suprahilar peribronchial structures; hypermetabolic metastasis to the right hepatic lobe; several small skeletal metastasis including right iliac bone, left sacrum and left scapula; multiple soft tissue metastases to the subcutaneous tissues adjacent to the left scapular musculature, retroperitoneal nodal metastasis along the left psoas muscle and metastatic lesion within the left gluteal musculature. No lesion in the lumbar spine or spinal canal to explain the patient's acute right-sided leg pain. ? Ultrasound-guided biopsy of a right liver lesion 01/04/2017-poorly differentiated adenocarcinoma ? PD-L1 expression30% ? Cycle 1 FOLFOX  01/05/2017 ? Cycle 2 FOLFOX 01/19/2017 ? Cycle 3 FOLFOX1/03/2017 ?  Cycle 4 FOLFOX 02/15/2017 ? Cycle 5 FOLFOX 03/01/2017 ? CTs 03/13/2017 decreased size and number of lymph nodes in the chest, abdomen, and retroperitoneum. Decreased size of largest liver lesion, slight increase in size of small liver lesion, slight increase in left pleural effusion, development of inflammatory appearing airspace disease ? Cycle 6 FOLFOX 03/15/2017 ? Cycle 7 FOLFOX 03/29/2017 ? Cycle 8 FOLFOX 04/17/2017 (Oxaliplatin discontinued) ? CT chest 04/21/2017- progression of thoracic and upper abdominal nodal metastases, masslike opacity in the right upper lobe-progressive with associated lymphangitic tumor spread in the right lung, new right pleural effusion, unchanged left pleural effusion ? Thoracentesis 04/24/2017- metastatic adenocarcinoma ? Cycle 1 pembrolizumab 04/26/2017 2. Painpossibly due to disc herniation-the pain may be related to retroperitoneal lymphadenopathy or bone metastases;plain x-ray right hip 12/21/2016 with no blastic or lytic bone lesions. No fracture or dislocation.Review of the lumbar spine MRI shows right L4 disc herniation which could be responsible for the right leg pain and weakness. Trial of dexamethasone initiatedwith improvement.  3.Rheumatoid arthritis  4.Dysphagia/weight loss secondary to #1-improved 02/01/2017.  5.Chronic left pleural effusion  6.Port-A-Cath placement 01/04/2017  7.Early oxaliplatin neuropathy-diminished vibratory sense;mildly decreased to intact 03/29/2017.  8.Right lung inflammatory changes noted on chest CT 03/13/2017;increased cough 04/12/2017. Chest x-ray 04/12/2017 with similar appearance left chronic pleural effusion. Diffuse airspace disease right lung similar to prior chest CT.  Admission 04/28/2017 with progressive respiratory failure  CT chest 04/28/2017-negative for pulmonary embolism, distal esophagus thickening, right upper lobe  mass/pleural thickening, lymphatic tumor spread in the right lung with development of interstitial thickening in the left lung, stable left effusion, loculated right effusion, ascites  9.  Atrial fibrillation  Mr. Magallon has improved significantly since hospital admission.  He is now maintained on a 2-3 L nasal cannula with adequate oxygen saturations.  He was able to ambulate in the hall this morning though he became fatigued and the saturations dropped while he was ambulating.  He wants to go home.  We had planned for Taxol/carboplatin chemotherapy while an inpatient.  I decided to hold chemotherapy given his clinical improvement over the past several days and the elevated liver enzymes.  The liver enzyme elevation is likely related to polypharmacy and hepatic congestion.   Recommendations: 1.  Continue oxygen 2.  Medical therapy for atrial fibrillation per cardiology 3.  Outpatient follow-up will be scheduled at the Cancer center for 05/09/2017 or 05/10/2017 with the plan to initiate Taxol/carboplatin if his clinical status is not further improved.     LOS: 7 days   Betsy Coder, MD   05/05/2017, 7:32 AM

## 2017-05-05 NOTE — Progress Notes (Signed)
Patient ambulating in hall on 3L of nasal cannula. His oxygen saturation began at 96% then dropped to 84% while ambulating. Patient took a break in ambulating "to catch my breath". Patient ambulated to room and sat in chair and his breathing recovered. At rest, oxygen saturation increased to 97% on 3L. Will continue to monitor patient.

## 2017-05-08 ENCOUNTER — Other Ambulatory Visit: Payer: Self-pay

## 2017-05-08 ENCOUNTER — Telehealth (HOSPITAL_COMMUNITY): Payer: Self-pay

## 2017-05-08 DIAGNOSIS — C155 Malignant neoplasm of lower third of esophagus: Secondary | ICD-10-CM

## 2017-05-08 NOTE — Telephone Encounter (Signed)
Left Msg 4/08 for Dr.Curcio  regurad

## 2017-05-09 ENCOUNTER — Inpatient Hospital Stay: Payer: No Typology Code available for payment source | Attending: Oncology

## 2017-05-09 ENCOUNTER — Inpatient Hospital Stay (HOSPITAL_BASED_OUTPATIENT_CLINIC_OR_DEPARTMENT_OTHER): Payer: No Typology Code available for payment source | Admitting: Oncology

## 2017-05-09 ENCOUNTER — Inpatient Hospital Stay: Payer: No Typology Code available for payment source

## 2017-05-09 VITALS — BP 123/80 | HR 90 | Temp 98.1°F | Resp 18 | Ht 68.0 in | Wt 178.6 lb

## 2017-05-09 VITALS — BP 129/77 | HR 80 | Temp 98.7°F | Resp 17

## 2017-05-09 DIAGNOSIS — R5383 Other fatigue: Secondary | ICD-10-CM | POA: Diagnosis not present

## 2017-05-09 DIAGNOSIS — C155 Malignant neoplasm of lower third of esophagus: Secondary | ICD-10-CM | POA: Diagnosis not present

## 2017-05-09 DIAGNOSIS — C787 Secondary malignant neoplasm of liver and intrahepatic bile duct: Secondary | ICD-10-CM | POA: Insufficient documentation

## 2017-05-09 DIAGNOSIS — R600 Localized edema: Secondary | ICD-10-CM | POA: Diagnosis not present

## 2017-05-09 DIAGNOSIS — R0600 Dyspnea, unspecified: Secondary | ICD-10-CM | POA: Diagnosis not present

## 2017-05-09 DIAGNOSIS — M069 Rheumatoid arthritis, unspecified: Secondary | ICD-10-CM | POA: Insufficient documentation

## 2017-05-09 DIAGNOSIS — C778 Secondary and unspecified malignant neoplasm of lymph nodes of multiple regions: Secondary | ICD-10-CM | POA: Diagnosis not present

## 2017-05-09 DIAGNOSIS — E8809 Other disorders of plasma-protein metabolism, not elsewhere classified: Secondary | ICD-10-CM | POA: Diagnosis not present

## 2017-05-09 DIAGNOSIS — Z5111 Encounter for antineoplastic chemotherapy: Secondary | ICD-10-CM | POA: Diagnosis present

## 2017-05-09 DIAGNOSIS — Z79899 Other long term (current) drug therapy: Secondary | ICD-10-CM | POA: Diagnosis not present

## 2017-05-09 DIAGNOSIS — Z9981 Dependence on supplemental oxygen: Secondary | ICD-10-CM | POA: Insufficient documentation

## 2017-05-09 LAB — CMP (CANCER CENTER ONLY)
ALT: 42 U/L (ref 0–55)
AST: 28 U/L (ref 5–34)
Albumin: 1.9 g/dL — ABNORMAL LOW (ref 3.5–5.0)
Alkaline Phosphatase: 296 U/L — ABNORMAL HIGH (ref 40–150)
Anion gap: 8 (ref 3–11)
BUN: 26 mg/dL (ref 7–26)
CO2: 28 mmol/L (ref 22–29)
Calcium: 8.9 mg/dL (ref 8.4–10.4)
Chloride: 102 mmol/L (ref 98–109)
Creatinine: 1.09 mg/dL (ref 0.70–1.30)
GFR, Est AFR Am: >60 mL/min (ref >60)
GFR, Estimated: >60 mL/min (ref >60)
Glucose, Bld: 116 mg/dL (ref 70–140)
Potassium: 4.3 mmol/L (ref 3.5–5.1)
Sodium: 138 mmol/L (ref 136–145)
Total Bilirubin: 0.3 mg/dL (ref 0.2–1.2)
Total Protein: 5.8 g/dL — ABNORMAL LOW (ref 6.4–8.3)

## 2017-05-09 LAB — CBC WITH DIFFERENTIAL (CANCER CENTER ONLY)
Basophils Absolute: 0.1 K/uL (ref 0.0–0.1)
Basophils Relative: 1 %
Eosinophils Absolute: 0.3 K/uL (ref 0.0–0.5)
Eosinophils Relative: 1 %
HCT: 33.2 % — ABNORMAL LOW (ref 38.4–49.9)
Hemoglobin: 10.7 g/dL — ABNORMAL LOW (ref 13.0–17.1)
Lymphocytes Relative: 3 %
Lymphs Abs: 0.8 K/uL — ABNORMAL LOW (ref 0.9–3.3)
MCH: 28.7 pg (ref 27.2–33.4)
MCHC: 32.2 g/dL (ref 32.0–36.0)
MCV: 89.1 fL (ref 79.3–98.0)
Monocytes Absolute: 1.2 K/uL — ABNORMAL HIGH (ref 0.1–0.9)
Monocytes Relative: 5 %
Neutro Abs: 19.8 K/uL — ABNORMAL HIGH (ref 1.5–6.5)
Neutrophils Relative %: 90 %
Platelet Count: 203 K/uL (ref 140–400)
RBC: 3.73 MIL/uL — ABNORMAL LOW (ref 4.20–5.82)
RDW: 20.2 % — ABNORMAL HIGH (ref 11.0–14.6)
Smear Review: 1
WBC Count: 22.2 K/uL — ABNORMAL HIGH (ref 4.0–10.3)

## 2017-05-09 MED ORDER — FAMOTIDINE IN NACL 20-0.9 MG/50ML-% IV SOLN
20.0000 mg | Freq: Once | INTRAVENOUS | Status: AC
  Administered 2017-05-09: 20 mg via INTRAVENOUS

## 2017-05-09 MED ORDER — DIPHENHYDRAMINE HCL 50 MG/ML IJ SOLN
INTRAMUSCULAR | Status: AC
  Filled 2017-05-09: qty 1

## 2017-05-09 MED ORDER — SODIUM CHLORIDE 0.9% FLUSH
10.0000 mL | INTRAVENOUS | Status: DC | PRN
  Administered 2017-05-09: 10 mL
  Filled 2017-05-09: qty 10

## 2017-05-09 MED ORDER — PALONOSETRON HCL INJECTION 0.25 MG/5ML
0.2500 mg | Freq: Once | INTRAVENOUS | Status: AC
  Administered 2017-05-09: 0.25 mg via INTRAVENOUS

## 2017-05-09 MED ORDER — SODIUM CHLORIDE 0.9 % IV SOLN
194.0000 mg | Freq: Once | INTRAVENOUS | Status: AC
  Administered 2017-05-09: 190 mg via INTRAVENOUS
  Filled 2017-05-09: qty 19

## 2017-05-09 MED ORDER — PALONOSETRON HCL INJECTION 0.25 MG/5ML
INTRAVENOUS | Status: AC
Start: 2017-05-09 — End: 2017-05-09
  Filled 2017-05-09: qty 5

## 2017-05-09 MED ORDER — FUROSEMIDE 20 MG PO TABS: 20.0000 mg | ORAL_TABLET | Freq: Every day | ORAL | 2 refills | Status: AC

## 2017-05-09 MED ORDER — SODIUM CHLORIDE 0.9 % IV SOLN
80.0000 mg/m2 | Freq: Once | INTRAVENOUS | Status: AC
  Administered 2017-05-09: 150 mg via INTRAVENOUS
  Filled 2017-05-09: qty 25

## 2017-05-09 MED ORDER — DIPHENHYDRAMINE HCL 50 MG/ML IJ SOLN
25.0000 mg | Freq: Once | INTRAMUSCULAR | Status: AC
  Administered 2017-05-09: 25 mg via INTRAVENOUS

## 2017-05-09 MED ORDER — PROCHLORPERAZINE MALEATE 5 MG PO TABS: 5.0000 mg | ORAL_TABLET | Freq: Four times a day (QID) | ORAL | 0 refills | Status: AC | PRN

## 2017-05-09 MED ORDER — FAMOTIDINE IN NACL 20-0.9 MG/50ML-% IV SOLN
INTRAVENOUS | Status: AC
Start: 2017-05-09 — End: 2017-05-09
  Filled 2017-05-09: qty 50

## 2017-05-09 MED ORDER — SODIUM CHLORIDE 0.9 % IV SOLN
20.0000 mg | Freq: Once | INTRAVENOUS | Status: AC
  Administered 2017-05-09: 20 mg via INTRAVENOUS
  Filled 2017-05-09: qty 2

## 2017-05-09 MED ORDER — SODIUM CHLORIDE 0.9 % IV SOLN
Freq: Once | INTRAVENOUS | Status: AC
  Administered 2017-05-09: 13:00:00 via INTRAVENOUS

## 2017-05-09 MED ORDER — HEPARIN SOD (PORK) LOCK FLUSH 100 UNIT/ML IV SOLN
500.0000 [IU] | Freq: Once | INTRAVENOUS | Status: AC | PRN
  Administered 2017-05-09: 500 [IU]
  Filled 2017-05-09: qty 5

## 2017-05-09 NOTE — Patient Instructions (Signed)
[facility] Discharge Instructions for Patients Receiving Chemotherapy  Today you received the following chemotherapy agents Paclitaxel, Carboplatin  To help prevent nausea and vomiting after your treatment, we encourage you to take your nausea medication as directed   If you develop nausea and vomiting that is not controlled by your nausea medication, call the clinic.   BELOW ARE SYMPTOMS THAT SHOULD BE REPORTED IMMEDIATELY:  *FEVER GREATER THAN 100.5 F  *CHILLS WITH OR WITHOUT FEVER  NAUSEA AND VOMITING THAT IS NOT CONTROLLED WITH YOUR NAUSEA MEDICATION  *UNUSUAL SHORTNESS OF BREATH  *UNUSUAL BRUISING OR BLEEDING  TENDERNESS IN MOUTH AND THROAT WITH OR WITHOUT PRESENCE OF ULCERS  *URINARY PROBLEMS  *BOWEL PROBLEMS  UNUSUAL RASH Items with * indicate a potential emergency and should be followed up as soon as possible.  Feel free to call the clinic should you have any questions or concerns. The clinic phone number is [phone].  Please show the CHEMO ALERT CARD at check-in to the Emergency Department and triage nurse.   Paclitaxel injection What is this medicine? PACLITAXEL (PAK li TAX el) is a chemotherapy drug. It targets fast dividing cells, like cancer cells, and causes these cells to die. This medicine is used to treat ovarian cancer, breast cancer, and other cancers. This medicine may be used for other purposes; ask your health care provider or pharmacist if you have questions. COMMON BRAND NAME(S): Onxol, Taxol What should I tell my health care provider before I take this medicine? They need to know if you have any of these conditions: -blood disorders -irregular heartbeat -infection (especially a virus infection such as chickenpox, cold sores, or herpes) -liver disease -previous or ongoing radiation therapy -an unusual or allergic reaction to paclitaxel, alcohol, polyoxyethylated castor oil, other chemotherapy agents, other medicines, foods,  dyes, or preservatives -pregnant or trying to get pregnant -breast-feeding How should I use this medicine? This drug is given as an infusion into a vein. It is administered in a hospital or clinic by a specially trained health care professional. Talk to your pediatrician regarding the use of this medicine in children. Special care may be needed. Overdosage: If you think you have taken too much of this medicine contact a poison control center or emergency room at once. NOTE: This medicine is only for you. Do not share this medicine with others. What if I miss a dose? It is important not to miss your dose. Call your doctor or health care professional if you are unable to keep an appointment. What may interact with this medicine? Do not take this medicine with any of the following medications: -disulfiram -metronidazole This medicine may also interact with the following medications: -cyclosporine -diazepam -ketoconazole -medicines to increase blood counts like filgrastim, pegfilgrastim, sargramostim -other chemotherapy drugs like cisplatin, doxorubicin, epirubicin, etoposide, teniposide, vincristine -quinidine -testosterone -vaccines -verapamil Talk to your doctor or health care professional before taking any of these medicines: -acetaminophen -aspirin -ibuprofen -ketoprofen -naproxen This list may not describe all possible interactions. Give your health care provider a list of all the medicines, herbs, non-prescription drugs, or dietary supplements you use. Also tell them if you smoke, drink alcohol, or use illegal drugs. Some items may interact with your medicine. What should I watch for while using this medicine? Your condition will be monitored carefully while you are receiving this medicine. You will need important blood work done while you are taking this medicine. This medicine can cause serious allergic reactions. To reduce your risk you will need   to take other medicine(s)  before treatment with this medicine. If you experience allergic reactions like skin rash, itching or hives, swelling of the face, lips, or tongue, tell your doctor or health care professional right away. In some cases, you may be given additional medicines to help with side effects. Follow all directions for their use. This drug may make you feel generally unwell. This is not uncommon, as chemotherapy can affect healthy cells as well as cancer cells. Report any side effects. Continue your course of treatment even though you feel ill unless your doctor tells you to stop. Call your doctor or health care professional for advice if you get a fever, chills or sore throat, or other symptoms of a cold or flu. Do not treat yourself. This drug decreases your body's ability to fight infections. Try to avoid being around people who are sick. This medicine may increase your risk to bruise or bleed. Call your doctor or health care professional if you notice any unusual bleeding. Be careful brushing and flossing your teeth or using a toothpick because you may get an infection or bleed more easily. If you have any dental work done, tell your dentist you are receiving this medicine. Avoid taking products that contain aspirin, acetaminophen, ibuprofen, naproxen, or ketoprofen unless instructed by your doctor. These medicines may hide a fever. Do not become pregnant while taking this medicine. Women should inform their doctor if they wish to become pregnant or think they might be pregnant. There is a potential for serious side effects to an unborn child. Talk to your health care professional or pharmacist for more information. Do not breast-feed an infant while taking this medicine. Men are advised not to father a child while receiving this medicine. This product may contain alcohol. Ask your pharmacist or healthcare provider if this medicine contains alcohol. Be sure to tell all healthcare providers you are taking this  medicine. Certain medicines, like metronidazole and disulfiram, can cause an unpleasant reaction when taken with alcohol. The reaction includes flushing, headache, nausea, vomiting, sweating, and increased thirst. The reaction can last from 30 minutes to several hours. What side effects may I notice from receiving this medicine? Side effects that you should report to your doctor or health care professional as soon as possible: -allergic reactions like skin rash, itching or hives, swelling of the face, lips, or tongue -low blood counts - This drug may decrease the number of white blood cells, red blood cells and platelets. You may be at increased risk for infections and bleeding. -signs of infection - fever or chills, cough, sore throat, pain or difficulty passing urine -signs of decreased platelets or bleeding - bruising, pinpoint red spots on the skin, black, tarry stools, nosebleeds -signs of decreased red blood cells - unusually weak or tired, fainting spells, lightheadedness -breathing problems -chest pain -high or low blood pressure -mouth sores -nausea and vomiting -pain, swelling, redness or irritation at the injection site -pain, tingling, numbness in the hands or feet -slow or irregular heartbeat -swelling of the ankle, feet, hands Side effects that usually do not require medical attention (report to your doctor or health care professional if they continue or are bothersome): -bone pain -complete hair loss including hair on your head, underarms, pubic hair, eyebrows, and eyelashes -changes in the color of fingernails -diarrhea -loosening of the fingernails -loss of appetite -muscle or joint pain -red flush to skin -sweating This list may not describe all possible side effects. Call your doctor for medical advice   about side effects. You may report side effects to FDA at 1-800-FDA-1088. Where should I keep my medicine? This drug is given in a hospital or clinic and will not be  stored at home. NOTE: This sheet is a summary. It may not cover all possible information. If you have questions about this medicine, talk to your doctor, pharmacist, or health care provider.  2018 Elsevier/Gold Standard (2014-11-18 19:58:00)  Carboplatin injection What is this medicine? CARBOPLATIN (KAR boe pla tin) is a chemotherapy drug. It targets fast dividing cells, like cancer cells, and causes these cells to die. This medicine is used to treat ovarian cancer and many other cancers. This medicine may be used for other purposes; ask your health care provider or pharmacist if you have questions. COMMON BRAND NAME(S): Paraplatin What should I tell my health care provider before I take this medicine? They need to know if you have any of these conditions: -blood disorders -hearing problems -kidney disease -recent or ongoing radiation therapy -an unusual or allergic reaction to carboplatin, cisplatin, other chemotherapy, other medicines, foods, dyes, or preservatives -pregnant or trying to get pregnant -breast-feeding How should I use this medicine? This drug is usually given as an infusion into a vein. It is administered in a hospital or clinic by a specially trained health care professional. Talk to your pediatrician regarding the use of this medicine in children. Special care may be needed. Overdosage: If you think you have taken too much of this medicine contact a poison control center or emergency room at once. NOTE: This medicine is only for you. Do not share this medicine with others. What if I miss a dose? It is important not to miss a dose. Call your doctor or health care professional if you are unable to keep an appointment. What may interact with this medicine? -medicines for seizures -medicines to increase blood counts like filgrastim, pegfilgrastim, sargramostim -some antibiotics like amikacin, gentamicin, neomycin, streptomycin, tobramycin -vaccines Talk to your doctor or  health care professional before taking any of these medicines: -acetaminophen -aspirin -ibuprofen -ketoprofen -naproxen This list may not describe all possible interactions. Give your health care provider a list of all the medicines, herbs, non-prescription drugs, or dietary supplements you use. Also tell them if you smoke, drink alcohol, or use illegal drugs. Some items may interact with your medicine. What should I watch for while using this medicine? Your condition will be monitored carefully while you are receiving this medicine. You will need important blood work done while you are taking this medicine. This drug may make you feel generally unwell. This is not uncommon, as chemotherapy can affect healthy cells as well as cancer cells. Report any side effects. Continue your course of treatment even though you feel ill unless your doctor tells you to stop. In some cases, you may be given additional medicines to help with side effects. Follow all directions for their use. Call your doctor or health care professional for advice if you get a fever, chills or sore throat, or other symptoms of a cold or flu. Do not treat yourself. This drug decreases your body's ability to fight infections. Try to avoid being around people who are sick. This medicine may increase your risk to bruise or bleed. Call your doctor or health care professional if you notice any unusual bleeding. Be careful brushing and flossing your teeth or using a toothpick because you may get an infection or bleed more easily. If you have any dental work done, tell your dentist   you are receiving this medicine. Avoid taking products that contain aspirin, acetaminophen, ibuprofen, naproxen, or ketoprofen unless instructed by your doctor. These medicines may hide a fever. Do not become pregnant while taking this medicine. Women should inform their doctor if they wish to become pregnant or think they might be pregnant. There is a potential for  serious side effects to an unborn child. Talk to your health care professional or pharmacist for more information. Do not breast-feed an infant while taking this medicine. What side effects may I notice from receiving this medicine? Side effects that you should report to your doctor or health care professional as soon as possible: -allergic reactions like skin rash, itching or hives, swelling of the face, lips, or tongue -signs of infection - fever or chills, cough, sore throat, pain or difficulty passing urine -signs of decreased platelets or bleeding - bruising, pinpoint red spots on the skin, black, tarry stools, nosebleeds -signs of decreased red blood cells - unusually weak or tired, fainting spells, lightheadedness -breathing problems -changes in hearing -changes in vision -chest pain -high blood pressure -low blood counts - This drug may decrease the number of white blood cells, red blood cells and platelets. You may be at increased risk for infections and bleeding. -nausea and vomiting -pain, swelling, redness or irritation at the injection site -pain, tingling, numbness in the hands or feet -problems with balance, talking, walking -trouble passing urine or change in the amount of urine Side effects that usually do not require medical attention (report to your doctor or health care professional if they continue or are bothersome): -hair loss -loss of appetite -metallic taste in the mouth or changes in taste This list may not describe all possible side effects. Call your doctor for medical advice about side effects. You may report side effects to FDA at 1-800-FDA-1088. Where should I keep my medicine? This drug is given in a hospital or clinic and will not be stored at home. NOTE: This sheet is a summary. It may not cover all possible information. If you have questions about this medicine, talk to your doctor, pharmacist, or health care provider.  2018 Elsevier/Gold Standard  (2007-04-24 14:38:05)  

## 2017-05-09 NOTE — Addendum Note (Signed)
Addended by: Mathis Fare on: 05/09/2017 02:05 PM   Modules accepted: Orders

## 2017-05-09 NOTE — Progress Notes (Signed)
[place] OFFICE PROGRESS NOTE   Diagnosis: Esophagus cancer  INTERVAL HISTORY:   [name] returns as scheduled.  He was discharged 05/05/2017.  He reports stable dyspnea.  He is maintained on a 3 L nasal cannula and reports the oxygen saturations have stayed in the 90s.  He complains of fatigue.  He can ambulate a short distance.  He has developed increased leg edema.  Good appetite.  He is no longer taking diltiazem.  He continues an amiodarone taper and the heart rate has not been elevated.  He is also on a prednisone taper.  Objective:  Vital signs in last 24 hours:  Blood pressure 123/80, pulse 90, temperature 98.1 F (36.7 C), temperature source Oral, resp. rate 18, height '5\' 8"'$  (1.727 m), weight 178 lb 9.6 oz (81 kg), SpO2 95 %.    HEENT: No thrush or ulcers Resp: Lungs with good air movement bilaterally, end inspiratory rub at the left posterior base Cardio: Regular rate and rhythm GI: No hepatomegaly, nontender, mildly distended Vascular: 1+ pitting edema below the knee bilaterally, trace edema in the hands    Portacath/PICC-without erythema  Lab Results:  Lab Results  Component Value Date   WBC 22.2 (H) 05/09/2017   HGB 11.3 (L) 05/03/2017   HCT 33.2 (L) 05/09/2017   MCV 89.1 05/09/2017   PLT 203 05/09/2017   NEUTROABS 19.8 (H) 05/09/2017    CMP     Component Value Date/Time   NA 138 05/09/2017 1002   NA 139 02/01/2017 0853   K 4.3 05/09/2017 1002   K 3.6 02/01/2017 0853   CL 102 05/09/2017 1002   CO2 28 05/09/2017 1002   CO2 25 02/01/2017 0853   GLUCOSE 116 05/09/2017 1002   GLUCOSE 102 02/01/2017 0853   BUN 26 05/09/2017 1002   BUN 18.3 02/01/2017 0853   CREATININE 1.09 05/09/2017 1002   CREATININE 0.7 02/01/2017 0853   CALCIUM 8.9 05/09/2017 1002   CALCIUM 8.8 02/01/2017 0853   PROT 5.8 (L) 05/09/2017 1002   PROT 6.0 (L) 02/01/2017 0853   ALBUMIN 1.9 (L) 05/09/2017 1002   ALBUMIN 2.3 (L) 02/01/2017 0853   AST 28 05/09/2017  1002   AST 31 02/01/2017 0853   ALT 42 05/09/2017 1002   ALT 41 02/01/2017 0853   ALKPHOS 296 (H) 05/09/2017 1002   ALKPHOS 308 (H) 02/01/2017 0853   BILITOT 0.3 05/09/2017 1002   BILITOT 0.23 02/01/2017 0853   GFRNONAA >60 05/09/2017 1002   GFRAA >60 05/09/2017 1002    Lab Results  Component Value Date   CEA1 1,045.42 (H) 04/12/2017     Medications: I have reviewed the patient's current medications.   Assessment/Plan: 1. Metastatic esophagus cancer ? Distal esophagus mass at upper endoscopy 12/15/2016, biopsy confirmed invasive poorly differentiated adenocarcinoma ? CTs of 02/16/2016-distal esophagus thickening, mediastinal/right hilar lymphadenopathy, abdominal/retroperitoneal lymphadenopathy, liver metastases ? MRI lumbar spine 12/20/2016-7 mm S1 lesion suspicious for metastasis, bulky retroperitoneal and prevertebral malignant lymphadenopathy, leftpsoasmuscle metastasis, degenerative disease causing severe neural foraminal stenosis at right L4 and left L5 ? PET scan 12/27/2016-hypermetabolic mass at the gastroesophageal junction; extensive hypermetabolic adenopathy involving the right supraclavicular nodal station, mediastinal lymph nodes, retrocrural lymph nodes, periaortic lymph nodes, left iliac lymph nodes and central mesenteric lymph nodes; fine nodularity in the right upper lobe with hypermetabolic thickening in the right suprahilar peribronchial structures; hypermetabolic metastasis to the right hepatic lobe; several small skeletal metastasis including right iliac bone, left sacrum and left scapula; multiple soft tissue  metastases to the subcutaneous tissues adjacent to the left scapular musculature, retroperitoneal nodal metastasis along the left psoas muscle and metastatic lesion within the left gluteal musculature. No lesion in the lumbar spine or spinal canal to explain the patient's acute right-sided leg pain. ? Ultrasound-guided biopsy of a right liver lesion  01/04/2017-poorly differentiated adenocarcinoma ? PD-L1 expression30% ? Cycle 1 FOLFOX 01/05/2017 ? Cycle 2 FOLFOX 01/19/2017 ? Cycle 3 FOLFOX1/03/2017 ? Cycle 4 FOLFOX 02/15/2017 ? Cycle 5 FOLFOX 03/01/2017 ? CTs 03/13/2017 decreased size and number of lymph nodes in the chest, abdomen, and retroperitoneum. Decreased size of largest liver lesion, slight increase in size of small liver lesion, slight increase in left pleural effusion, development of inflammatory appearing airspace disease ? Cycle 6 FOLFOX 03/15/2017 ? Cycle 7 FOLFOX 03/29/2017 ? Cycle 8 FOLFOX 04/17/2017 (Oxaliplatin discontinued) ? CT chest 04/21/2017-progression of thoracic and upper abdominal nodal metastases, masslike opacity in the right upper lobe-progressive with associated lymphangitic tumor spread in the right lung, new right pleural effusion, unchanged left pleural effusion ? Thoracentesis 04/24/2017-metastatic adenocarcinoma ? Cycle 1 pembrolizumab 04/26/2017 ? Cycle 1 weekly Taxol/carboplatin 05/09/2017 2. Painpossibly due to disc herniation-the pain may be related to retroperitoneal lymphadenopathy or bone metastases;plain x-ray right hip 12/21/2016 with no blastic or lytic bone lesions. No fracture or dislocation.Review of the lumbar spine MRI shows right L4 disc herniation which could be responsible for the right leg pain and weakness. Trial of dexamethasone initiatedwith improvement.  3.Rheumatoid arthritis  4.Dysphagia/weight loss secondary to #1-improved 02/01/2017.  5.Chronic left pleural effusion  6.Port-A-Cath placement 01/04/2017  7.Early oxaliplatin neuropathy-diminished vibratory sense;mildly decreased to intact 03/29/2017.  8.Right lung inflammatory changes noted on chest CT 03/13/2017;increased cough 04/12/2017. Chest x-ray 04/12/2017 with similar appearance left chronic pleural effusion. Diffuse airspace disease right lung similar to prior chest CT.  Admission 04/28/2017 with  progressive respiratory failure  CT chest 04/28/2017-negative for pulmonary embolism, distal esophagus thickening, right upper lobe mass/pleural thickening, lymphatic tumor spread in the right lung with development of interstitial thickening in the left lung, stable left effusion, loculated right effusion, ascites  9.  Atrial fibrillation      Disposition: Mr. Tatsch appears stable since discharge from the hospital.  He continues to have fatigue and dyspnea with minimal exertion.  He is maintained on home oxygen therapy.  He has developed anasarca secondary to hypoalbuminemia, steroids, immobility, and increased chest pressure.  He will begin a trial of low-dose Lasix.  He has completed 1 treatment with pembrolizumab.  We discussed the risk/benefit of adding Taxol/carboplatin.  His pain has improved, but his overall performance status and respiratory status remained poor.  He does not wish to consider hospice care.  We decided to proceed with weekly Taxol/carboplatin.  We again reviewed potential toxicities associated with this regimen including the chance of hematologic toxicity and an allergic reaction.  He agrees to proceed.  He will return for an office visit and chemotherapy in 1 week.  25 minutes were spent with the patient today.  The majority of the time was used for counseling and coordination of care.  Betsy Coder, MD  05/09/2017  11:10 AM

## 2017-05-09 NOTE — Addendum Note (Signed)
Addended by: Mathis Fare on: 05/09/2017 01:28 PM   Modules accepted: Orders

## 2017-05-11 ENCOUNTER — Telehealth: Payer: Self-pay

## 2017-05-11 NOTE — Telephone Encounter (Signed)
Returned call to wife regarding O2. Per wife pt was d/c from hospital on 3l o2. Pt usually on 4L o2. "Need new referral for different equipment and o2 order for 3-5L of o2 continously". Per Dr. Benay Spice, this ok. Order faxed to Oran Rein at [facility] [phone]. Relayed info to wife and voiced understanding.

## 2017-05-12 ENCOUNTER — Other Ambulatory Visit: Payer: Self-pay

## 2017-05-14 ENCOUNTER — Other Ambulatory Visit: Payer: Self-pay | Admitting: Oncology

## 2017-05-17 ENCOUNTER — Telehealth: Payer: Self-pay | Admitting: Nurse Practitioner

## 2017-05-17 ENCOUNTER — Telehealth: Payer: Self-pay

## 2017-05-17 ENCOUNTER — Inpatient Hospital Stay: Payer: No Typology Code available for payment source

## 2017-05-17 ENCOUNTER — Encounter: Payer: Self-pay | Admitting: Nurse Practitioner

## 2017-05-17 ENCOUNTER — Inpatient Hospital Stay (HOSPITAL_BASED_OUTPATIENT_CLINIC_OR_DEPARTMENT_OTHER): Payer: No Typology Code available for payment source | Admitting: Nurse Practitioner

## 2017-05-17 VITALS — BP 107/67 | HR 90 | Temp 98.6°F | Resp 20 | Ht 68.0 in | Wt 161.7 lb

## 2017-05-17 DIAGNOSIS — R5383 Other fatigue: Secondary | ICD-10-CM

## 2017-05-17 DIAGNOSIS — Z79899 Other long term (current) drug therapy: Secondary | ICD-10-CM | POA: Diagnosis not present

## 2017-05-17 DIAGNOSIS — C778 Secondary and unspecified malignant neoplasm of lymph nodes of multiple regions: Secondary | ICD-10-CM | POA: Diagnosis not present

## 2017-05-17 DIAGNOSIS — Z5111 Encounter for antineoplastic chemotherapy: Secondary | ICD-10-CM | POA: Diagnosis not present

## 2017-05-17 DIAGNOSIS — C155 Malignant neoplasm of lower third of esophagus: Secondary | ICD-10-CM | POA: Diagnosis not present

## 2017-05-17 DIAGNOSIS — Z9981 Dependence on supplemental oxygen: Secondary | ICD-10-CM | POA: Diagnosis not present

## 2017-05-17 DIAGNOSIS — R600 Localized edema: Secondary | ICD-10-CM | POA: Diagnosis not present

## 2017-05-17 DIAGNOSIS — C787 Secondary malignant neoplasm of liver and intrahepatic bile duct: Secondary | ICD-10-CM | POA: Diagnosis not present

## 2017-05-17 DIAGNOSIS — E8809 Other disorders of plasma-protein metabolism, not elsewhere classified: Secondary | ICD-10-CM

## 2017-05-17 DIAGNOSIS — M069 Rheumatoid arthritis, unspecified: Secondary | ICD-10-CM

## 2017-05-17 DIAGNOSIS — R0600 Dyspnea, unspecified: Secondary | ICD-10-CM | POA: Diagnosis not present

## 2017-05-17 LAB — CMP (CANCER CENTER ONLY)
ALBUMIN: 2.3 g/dL — AB (ref 3.5–5.0)
ALK PHOS: 243 U/L — AB (ref 40–150)
ALT: 30 U/L (ref 0–55)
AST: 34 U/L (ref 5–34)
Anion gap: 9 (ref 3–11)
BILIRUBIN TOTAL: 0.3 mg/dL (ref 0.2–1.2)
BUN: 18 mg/dL (ref 7–26)
CALCIUM: 9.5 mg/dL (ref 8.4–10.4)
CO2: 32 mmol/L — ABNORMAL HIGH (ref 22–29)
CREATININE: 0.76 mg/dL (ref 0.70–1.30)
Chloride: 99 mmol/L (ref 98–109)
GFR, Est AFR Am: >60 mL/min (ref >60)
GLUCOSE: 104 mg/dL (ref 70–140)
Potassium: 3.7 mmol/L (ref 3.5–5.1)
Sodium: 140 mmol/L (ref 136–145)
TOTAL PROTEIN: 6.3 g/dL — AB (ref 6.4–8.3)

## 2017-05-17 LAB — CBC WITH DIFFERENTIAL (CANCER CENTER ONLY)
BASOS ABS: 0.1 10*3/uL (ref 0.0–0.1)
Basophils Relative: 1 %
EOS ABS: 0.2 10*3/uL (ref 0.0–0.5)
EOS PCT: 2 %
HCT: 32.9 % — ABNORMAL LOW (ref 38.4–49.9)
Hemoglobin: 10.6 g/dL — ABNORMAL LOW (ref 13.0–17.1)
Lymphocytes Relative: 9 %
Lymphs Abs: 0.9 10*3/uL (ref 0.9–3.3)
MCH: 29 pg (ref 27.2–33.4)
MCHC: 32.2 g/dL (ref 32.0–36.0)
MCV: 90.1 fL (ref 79.3–98.0)
MONO ABS: 0.4 10*3/uL (ref 0.1–0.9)
Monocytes Relative: 4 %
Neutro Abs: 8.8 10*3/uL — ABNORMAL HIGH (ref 1.5–6.5)
Neutrophils Relative %: 84 %
PLATELETS: 212 10*3/uL (ref 140–400)
RBC: 3.65 MIL/uL — ABNORMAL LOW (ref 4.20–5.82)
RDW: 19.8 % — AB (ref 11.0–14.6)
WBC Count: 10.3 10*3/uL (ref 4.0–10.3)

## 2017-05-17 LAB — TSH: TSH: 2.105 u[IU]/mL (ref 0.320–4.118)

## 2017-05-17 MED ORDER — PALONOSETRON HCL INJECTION 0.25 MG/5ML
INTRAVENOUS | Status: AC
  Filled 2017-05-17: qty 5

## 2017-05-17 MED ORDER — FAMOTIDINE IN NACL 20-0.9 MG/50ML-% IV SOLN
20.0000 mg | Freq: Once | INTRAVENOUS | Status: AC
  Administered 2017-05-17: 20 mg via INTRAVENOUS

## 2017-05-17 MED ORDER — SODIUM CHLORIDE 0.9 % IV SOLN
200.0000 mg | Freq: Once | INTRAVENOUS | Status: AC
  Administered 2017-05-17: 200 mg via INTRAVENOUS
  Filled 2017-05-17: qty 8

## 2017-05-17 MED ORDER — HEPARIN SOD (PORK) LOCK FLUSH 100 UNIT/ML IV SOLN
500.0000 [IU] | Freq: Once | INTRAVENOUS | Status: AC | PRN
  Administered 2017-05-17: 500 [IU]
  Filled 2017-05-17: qty 5

## 2017-05-17 MED ORDER — FAMOTIDINE IN NACL 20-0.9 MG/50ML-% IV SOLN
INTRAVENOUS | Status: AC
  Filled 2017-05-17: qty 50

## 2017-05-17 MED ORDER — SODIUM CHLORIDE 0.9 % IV SOLN
80.0000 mg/m2 | Freq: Once | INTRAVENOUS | Status: AC
  Administered 2017-05-17: 150 mg via INTRAVENOUS
  Filled 2017-05-17: qty 25

## 2017-05-17 MED ORDER — SODIUM CHLORIDE 0.9 % IV SOLN
246.0000 mg | Freq: Once | INTRAVENOUS | Status: AC
  Administered 2017-05-17: 250 mg via INTRAVENOUS
  Filled 2017-05-17: qty 25

## 2017-05-17 MED ORDER — DIPHENHYDRAMINE HCL 50 MG/ML IJ SOLN
INTRAMUSCULAR | Status: AC
  Filled 2017-05-17: qty 1

## 2017-05-17 MED ORDER — DIPHENHYDRAMINE HCL 50 MG/ML IJ SOLN
25.0000 mg | Freq: Once | INTRAMUSCULAR | Status: AC
  Administered 2017-05-17: 25 mg via INTRAVENOUS

## 2017-05-17 MED ORDER — PALONOSETRON HCL INJECTION 0.25 MG/5ML
0.2500 mg | Freq: Once | INTRAVENOUS | Status: AC
  Administered 2017-05-17: 0.25 mg via INTRAVENOUS

## 2017-05-17 MED ORDER — SODIUM CHLORIDE 0.9% FLUSH
10.0000 mL | INTRAVENOUS | Status: DC | PRN
  Administered 2017-05-17: 10 mL
  Filled 2017-05-17: qty 10

## 2017-05-17 MED ORDER — DEXAMETHASONE SODIUM PHOSPHATE 10 MG/ML IJ SOLN
INTRAMUSCULAR | Status: AC
  Filled 2017-05-17: qty 1

## 2017-05-17 MED ORDER — SODIUM CHLORIDE 0.9 % IV SOLN: 10.0000 mg | Freq: Once | INTRAVENOUS | Status: DC

## 2017-05-17 MED ORDER — SODIUM CHLORIDE 0.9 % IV SOLN
Freq: Once | INTRAVENOUS | Status: AC
  Administered 2017-05-17: 12:00:00 via INTRAVENOUS

## 2017-05-17 MED ORDER — DEXAMETHASONE SODIUM PHOSPHATE 10 MG/ML IJ SOLN
10.0000 mg | Freq: Once | INTRAMUSCULAR | Status: AC
  Administered 2017-05-17: 10 mg via INTRAVENOUS

## 2017-05-17 NOTE — Telephone Encounter (Signed)
Unable to schedule 4/17 los due to capped days - logged - will contact patient when appts are scheduled.

## 2017-05-17 NOTE — Patient Instructions (Signed)
[place] Discharge Instructions for Patients Receiving Chemotherapy  Today you received the following chemotherapy agents Taxol, Carboplatin, and Keytruda.   To help prevent nausea and vomiting after your treatment, we encourage you to take your nausea medication as directed.   If you develop nausea and vomiting that is not controlled by your nausea medication, call the clinic.   BELOW ARE SYMPTOMS THAT SHOULD BE REPORTED IMMEDIATELY:  *FEVER GREATER THAN 100.5 F  *CHILLS WITH OR WITHOUT FEVER  NAUSEA AND VOMITING THAT IS NOT CONTROLLED WITH YOUR NAUSEA MEDICATION  *UNUSUAL SHORTNESS OF BREATH  *UNUSUAL BRUISING OR BLEEDING  TENDERNESS IN MOUTH AND THROAT WITH OR WITHOUT PRESENCE OF ULCERS  *URINARY PROBLEMS  *BOWEL PROBLEMS  UNUSUAL RASH Items with * indicate a potential emergency and should be followed up as soon as possible.  Feel free to call the clinic should you have any questions or concerns. The clinic phone number is [phone].  Please show the [place] at check-in to the Emergency Department and triage nurse.

## 2017-05-17 NOTE — Telephone Encounter (Signed)
Scheduled appt per 4/17 los - left message with appt date and time and sent a reminder letter in the mail.

## 2017-05-17 NOTE — Progress Notes (Addendum)
[name] OFFICE PROGRESS NOTE   Diagnosis: Esophagus cancer  INTERVAL HISTORY:   Grant Todd returns as scheduled.  He completed cycle 1 weekly Taxol/carboplatin 05/09/2017.  Has nausea/vomiting.  No mouth sores.  No diarrhea.  No rash.  No signs of allergic reaction.  He continues to have dyspnea.  Current oxygen is 4 L.  He reports weight loss is related to improvement in edema.  Main complaint is fatigue.  Objective:  Vital signs in last 24 hours:  Blood pressure 107/67, pulse 90, temperature 98.6 F (37 C), temperature source Oral, resp. rate 20, height '5\' 8"'$  (1.727 m), weight 161 lb 11.2 oz (73.3 kg), SpO2 99 %.    HEENT: No thrush or ulcers. Resp: Rub at the left posterior lung base.  Good air movement bilaterally.  No respiratory distress. Cardio: Regular rate and rhythm. GI: Abdomen is soft, mildly distended.  No hepatomegaly. Vascular: Pitting edema at the lower legs bilaterally. Port-A-Cath without erythema.  Lab Results:  Lab Results  Component Value Date   WBC 10.3 05/17/2017   HGB 11.3 (L) 05/03/2017   HCT 32.9 (L) 05/17/2017   MCV 90.1 05/17/2017   PLT 212 05/17/2017   NEUTROABS 8.8 (H) 05/17/2017    Imaging:  No results found.  Medications: I have reviewed the patient's current medications.  Assessment/Plan: 1. Metastatic esophagus cancer ? Distal esophagus mass at upper endoscopy 12/15/2016, biopsy confirmed invasive poorly differentiated adenocarcinoma ? CTs of 02/16/2016-distal esophagus thickening, mediastinal/right hilar lymphadenopathy, abdominal/retroperitoneal lymphadenopathy, liver metastases ? MRI lumbar spine 12/20/2016-7 mm S1 lesion suspicious for metastasis, bulky retroperitoneal and prevertebral malignant lymphadenopathy, leftpsoasmuscle metastasis, degenerative disease causing severe neural foraminal stenosis at right L4 and left L5 ? PET scan 12/27/2016-hypermetabolic mass at the gastroesophageal junction; extensive  hypermetabolic adenopathy involving the right supraclavicular nodal station, mediastinal lymph nodes, retrocrural lymph nodes, periaortic lymph nodes, left iliac lymph nodes and central mesenteric lymph nodes; fine nodularity in the right upper lobe with hypermetabolic thickening in the right suprahilar peribronchial structures; hypermetabolic metastasis to the right hepatic lobe; several small skeletal metastasis including right iliac bone, left sacrum and left scapula; multiple soft tissue metastases to the subcutaneous tissues adjacent to the left scapular musculature, retroperitoneal nodal metastasis along the left psoas muscle and metastatic lesion within the left gluteal musculature. No lesion in the lumbar spine or spinal canal to explain the patient's acute right-sided leg pain. ? Ultrasound-guided biopsy of a right liver lesion 01/04/2017-poorly differentiated adenocarcinoma ? PD-L1 expression30% ? Cycle 1 FOLFOX 01/05/2017 ? Cycle 2 FOLFOX 01/19/2017 ? Cycle 3 FOLFOX1/03/2017 ? Cycle 4 FOLFOX 02/15/2017 ? Cycle 5 FOLFOX 03/01/2017 ? CTs 03/13/2017 decreased size and number of lymph nodes in the chest, abdomen, and retroperitoneum. Decreased size of largest liver lesion, slight increase in size of small liver lesion, slight increase in left pleural effusion, development of inflammatory appearing airspace disease ? Cycle 6 FOLFOX 03/15/2017 ? Cycle 7 FOLFOX 03/29/2017 ? Cycle 8 FOLFOX 04/17/2017 (Oxaliplatin discontinued) ? CT chest 04/21/2017-progression of thoracic and upper abdominal nodal metastases, masslike opacity in the right upper lobe-progressive with associated lymphangitic tumor spread in the right lung, new right pleural effusion, unchanged left pleural effusion ? Thoracentesis 04/24/2017-metastatic adenocarcinoma ? Cycle 1 pembrolizumab 04/26/2017 ? Cycle 1 weekly Taxol/carboplatin 05/09/2017 ? Taxol/carboplatin/Pembrolizumab 05/17/2017 2. Painpossibly due to disc herniation-the pain  may be related to retroperitoneal lymphadenopathy or bone metastases;plain x-ray right hip 12/21/2016 with no blastic or lytic bone lesions. No fracture or dislocation.Review of the lumbar spine MRI  shows right L4 disc herniation which could be responsible for the right leg pain and weakness. Trial of dexamethasone initiatedwith improvement.  3.Rheumatoid arthritis  4.Dysphagia/weight loss secondary to #1-improved 02/01/2017.  5.Chronic left pleural effusion  6.Port-A-Cath placement 01/04/2017  7.Early oxaliplatin neuropathy-diminished vibratory sense;mildly decreased to intact 03/29/2017.  8.Right lung inflammatory changes noted on chest CT 03/13/2017;increased cough 04/12/2017. Chest x-ray 04/12/2017 with similar appearance left chronic pleural effusion. Diffuse airspace disease right lung similar to prior chest CT.  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 Atrial fibrillation     Disposition: Grant Todd appears unchanged.  Plan to proceed with cycle 2 weekly Taxol/carboplatin, cycle 2 Pembrolizumab today as scheduled.  He will return for cycle 3 weekly Taxol/carboplatin 05/24/2017.  We will see him in follow-up prior to treatment in 2 weeks.  He will contact the office in the interim with any problems.  Patient seen with Dr. Benay Spice.    Ned Card ANP/GNP-BC   05/17/2017  10:54 AM  This was a shared visit with Ned Card.  Grant Todd appears stable.  His overall clinical status has improved slightly over the past few weeks.  The plan is to continue systemic therapy with Taxol/carboplatin and pembrolizumab.  He appears to be tolerating the treatment well.  We  discussed the treatment plan with Grant Todd and his daughter.  The weight loss over the past few weeks is likely related to improvement in anasarca.  Grant Manson, MD

## 2017-05-17 NOTE — Telephone Encounter (Signed)
Left vm msg with nurse @ [facility] in regards to pt referral. Advised that pt has not received a call back yet. Will follow-up

## 2017-05-21 ENCOUNTER — Other Ambulatory Visit: Payer: Self-pay | Admitting: Oncology

## 2017-05-22 ENCOUNTER — Telehealth: Payer: Self-pay

## 2017-05-22 ENCOUNTER — Ambulatory Visit (INDEPENDENT_AMBULATORY_CARE_PROVIDER_SITE_OTHER): Payer: PRIVATE HEALTH INSURANCE | Admitting: Acute Care

## 2017-05-22 ENCOUNTER — Ambulatory Visit (INDEPENDENT_AMBULATORY_CARE_PROVIDER_SITE_OTHER)
Admission: RE | Admit: 2017-05-22 | Discharge: 2017-05-22 | Disposition: A | Discharge status: Home / Self Care | Payer: PRIVATE HEALTH INSURANCE | Source: Ambulatory Visit | Attending: Acute Care | Admitting: Acute Care

## 2017-05-22 ENCOUNTER — Encounter: Payer: Self-pay | Admitting: Acute Care

## 2017-05-22 VITALS — BP 100/60 | HR 98 | Ht 68.0 in | Wt 158.0 lb

## 2017-05-22 DIAGNOSIS — I493 Ventricular premature depolarization: Secondary | ICD-10-CM

## 2017-05-22 DIAGNOSIS — J329 Chronic sinusitis, unspecified: Secondary | ICD-10-CM | POA: Insufficient documentation

## 2017-05-22 DIAGNOSIS — J90 Pleural effusion, not elsewhere classified: Secondary | ICD-10-CM

## 2017-05-22 DIAGNOSIS — C799 Secondary malignant neoplasm of unspecified site: Secondary | ICD-10-CM | POA: Diagnosis not present

## 2017-05-22 DIAGNOSIS — C7800 Secondary malignant neoplasm of unspecified lung: Secondary | ICD-10-CM | POA: Diagnosis not present

## 2017-05-22 DIAGNOSIS — J441 Chronic obstructive pulmonary disease with (acute) exacerbation: Secondary | ICD-10-CM

## 2017-05-22 DIAGNOSIS — I4891 Unspecified atrial fibrillation: Secondary | ICD-10-CM | POA: Diagnosis not present

## 2017-05-22 MED ORDER — AMOXICILLIN-POT CLAVULANATE 875-125 MG PO TABS: 1.0000 | ORAL_TABLET | Freq: Two times a day (BID) | ORAL | 0 refills | Status: AC

## 2017-05-22 MED ORDER — HYDROCODONE-HOMATROPINE 5-1.5 MG/5ML PO SYRP: 5.0000 mL | ORAL_SOLUTION | Freq: Four times a day (QID) | ORAL | 0 refills | Status: DC | PRN

## 2017-05-22 NOTE — Assessment & Plan Note (Addendum)
05/22/2017 CXR>> Interval increase in airspace opacity in the right upper lobe which is a known area of loculated effusion with parenchymal malignancy. 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 Stable small to moderate pleural effusions greatest on the left.  States he is comfortable breathing on his oxygen Compliant with lasix daily Stable small to moderate pleural effusions L>R Increased RUL loculated effusion Effusions will need close monitoring>> Close follow up of clinical s/s, weight Plan: We will need to follow CXR for worsening Continued discussion about pleurex as needed Follow up CXR in 2 weeks  ( at cancer center ) Continue Lasix as you have been doing. Follow up after CXR in 2-3 weeks with Dr. Halford Chessman or Samanvi Cuccia Please contact office for sooner follow up if symptoms do not improve or worsen or seek emergency care

## 2017-05-22 NOTE — Telephone Encounter (Signed)
Phone call to schedule patient with palliative care

## 2017-05-22 NOTE — Assessment & Plan Note (Signed)
Better on Amio Plan: Needs cards follow up for eval and eventual transitioning off Amio We will refer you to [employer] cards ( [employer] or [employer]) Follow up with Dr. Halford Chessman or Judson Roch NP in 3-4 weeks Please contact office for sooner follow up if symptoms do not improve or worsen or seek emergency care

## 2017-05-22 NOTE — Assessment & Plan Note (Signed)
Sinusitis in immunocompromised patient Afebrile PND Plan: Augmentin 875 mg twice daily for sinus infection. Please use probiotics while on antibiotics Hydromet 5cc's at bedtime for cough. We will order humidification for all oxygen portable and home Xlear Sinus spray. Follow up CXR in 2 weeks  ( at cancer center ) Follow up in 2-3 weeks with Dr. Halford Chessman or Aurora Rody Please contact office for sooner follow up if symptoms do not improve or worsen or seek emergency care

## 2017-05-22 NOTE — Patient Instructions (Addendum)
It is nice to meet you today. Augmentin 875 mg twice daily for sinus infection. Please use probiotics while on antibiotics Hydromet 5cc's at bedtime for cough. We will order humidification for all oxygen portable and home Xlear Sinus spray. Follow up CXR in 2 weeks  ( at cancer center ) Continue Lasix as you have been doing. We will refer you to [employer] cards ( [employer] or [employer]) Follow up after CXR in 2-3 weeks with Dr. Halford Chessman or Makinzee Durley Please contact office for sooner follow up if symptoms do not improve or worsen or seek emergency care

## 2017-05-22 NOTE — Progress Notes (Signed)
History of Present Illness Grant Todd is a 62 y.o. male former smoker ( Quit 2003)  with metastatic esophageal cancer tx with FOLFOX, started on pembrolizumab 04/26/17 presented with dyspnea. Hospital admission 04/28/2017 for hypoxemia and elevated HR.Marland Kitchen  Found to have lymphangitic spread, malignant Rt pleural effusion, A fib with RVR.  PMHx of RA, chemo induced neuropathy, chronic Lt pleural effusion. Recent  He was seen as an inpatient by Dr. Halford Chessman.     05/22/2017 Hospital Follow Up: Pt. Presents for hospital follow up. He was admitted 3/29-05/05/2017 with Shortness of breath due to malignant right  pleural effusion, lymphatic spread of his esophogeal cancer, and  a fib with RVR. He underwent thoracenteses 3/25 with drainage of 2 Liters, was treated with oxygen, IV steroids, tapered to po dosing x 1 week at discharge, Antibiotics and scheduled BD's and diuretics. He was discharged home with oxygen. He is here for follow up. Pt. States he is having sinus issues. He has clear to yellow secretions. He thinks the oxygen is drying out his sinuses. He states he has tried nasal saline, which has not helped.CXR today showed a slight increase in right upper lobe loculated effusion, and stable small to moderate pleural effusions L>R. He states he feels as well as he did at hospital discharge or better. He states he is compliant with his lasix daily. He is complaining of cough that he suspects is due to PND. He has avoided anti histamines due to his dry sinuses. He is compliant with his amiodarone daily and is asking how long he will need to take it. Per his wife, he has been restarted on Taxol, Carboplatin and K Truda per oncology since hospital discharge.Marland Kitchen  He denies fever, chest pain, orthopnea or hemoptysis.   Test Results: 05/22/2017 CXR>> Interval increase in airspace opacity in the right upper lobe which is a known area of loculated effusion with parenchymal malignancy. 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 Stable small to moderate pleural effusions greatest on the left.     STUDIES: Rt thoracentesis 3/25 >> 2 liters fluid removed; adenocarcinoma CT chest 3/29 >> mild CAD, b/l enlarged LAN, 5.1 cm RUL mass, interstitial thickening Rt upper and middle lobes, interstitial thickening Lt lung, stable moderate Lt effusion, rt effusion with some loculations  CULTURES: Blood 3/29 >> negative Respiratory viral panel 3/30 >> negative  ANTIBIOTICS: Vancomycin 3/29 >> 4/04 Cefepime 3/29 >> 4/04  EVENTS: 3/29 Admit, start cardizem 3/31 Oncology consulted 4/02 Cardiology consulted, start amiodarone; start solumedrol 4/04 Change to prednisone 4/05 Discharged home    CBC Latest Ref Rng & Units 05/17/2017 05/09/2017 05/03/2017  WBC 4.0 - 10.3 K/uL 10.3 22.2(H) 25.9(H)  Hemoglobin 13.0 - 17.1 g/dL 10.6(L) 10.7(L) 11.3(L)  Hematocrit 38.4 - 49.9 % 32.9(L) 33.2(L) 35.7(L)  Platelets 140 - 400 K/uL 212 203 350    BMP Latest Ref Rng & Units 05/17/2017 05/09/2017 05/03/2017  Glucose 70 - 140 mg/dL 104 116 120(H)  BUN 7 - 26 mg/dL 18 26 27(H)  Creatinine 0.70 - 1.30 mg/dL 0.76 1.09 0.64  Sodium 136 - 145 mmol/L 140 138 143  Potassium 3.5 - 5.1 mmol/L 3.7 4.3 3.9  Chloride 98 - 109 mmol/L 99 102 106  CO2 22 - 29 mmol/L 32(H) 28 25  Calcium 8.4 - 10.4 mg/dL 9.5 8.9 9.5    BNP    Component Value Date/Time   BNP 133.0 (H) 05/01/2017 0630  ProBNP No results found for: PROBNP  PFT No results found for: FEV1PRE, FEV1POST, FVCPRE, FVCPOST, TLC, DLCOUNC, PREFEV1FVCRT, PSTFEV1FVCRT  Dg Chest 1 View  Result Date: 04/24/2017 CLINICAL DATA:  Status post thoracentesis, history of esophageal cancer EXAM: CHEST  1 VIEW COMPARISON:  CT chest 04/21/2017, radiograph 04/12/2017 FINDINGS: Small bilateral pleural effusions. Right-sided central venous port tip overlies SVC. No pneumothorax identified.  Masslike consolidation in the right upper lobe. Patchy infiltrate in the right peripheral lower lung. Stable cardiomediastinal silhouette. No pneumothorax. IMPRESSION: 1. Small right pleural effusion. Similar appearance of left pleural effusion or thickening. No pneumothorax seen. 2. Slight increased interstitial opacity in the right thorax. Similar appearance of masslike opacity in the right upper lobe with increased patchy infiltrate in the peripheral right lower lung. Electronically Signed   By: Donavan Foil M.D.   On: 04/24/2017 14:24   Dg Chest 2 View  Result Date: 05/22/2017 CLINICAL DATA:  Hospitalized 1 month ago for hypoxia and cough. History of esophageal malignancy metastatic to the lungs. Former smoker. EXAM: CHEST - 2 VIEW COMPARISON:  Chest x-ray of May 04, 2017 FINDINGS: There are persistent small to moderate-sized bilateral pleural effusions. Confluent airspace opacity is present and slightly more conspicuous in the right mid and upper lung. There is hazy increased density peripherally in the left upper lung radiating to the hilum which is more conspicuous. The heart is top-normal in size. The pulmonary vascularity is normal. The power port catheter tip projects over the midportion of the SVC. The bony thorax exhibits no acute abnormality. IMPRESSION: Interval increase in airspace opacity in the right upper lobe which is a known area of loculated effusion with parenchymal malignancy. 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 Stable small to moderate pleural effusions greatest on the left. Electronically Signed   By: David  Martinique M.D.   On: 05/22/2017 09:37   Ct Angio Chest Pe W And/or Wo Contrast  Result Date: 04/28/2017 CLINICAL DATA:  Esophageal cancer. Ongoing chemotherapy. Patient complains of shortness breath. EXAM: CT ANGIOGRAPHY CHEST WITH CONTRAST TECHNIQUE: Multidetector CT imaging of the  chest was performed using the standard protocol during bolus administration of intravenous contrast. Multiplanar CT image reconstructions and MIPs were obtained to evaluate the vascular anatomy. CONTRAST:  112mL ISOVUE-370 IOPAMIDOL (ISOVUE-370) INJECTION 76% COMPARISON:  04/21/2017 FINDINGS: Cardiovascular: Satisfactory opacification of the pulmonary arteries to the segmental level. No evidence of pulmonary embolism. Mildly enlarged heart. Tiny pericardial effusion. Mild coronary artery disease. Central venous catheter terminates in the distal superior vena cava. Mediastinum/Nodes: Enlarged bilateral mediastinal lymph nodes are not significantly changed with the largest node in right pretracheal location measuring 1.9 cm. Again seen is circumferential thickening of the distal esophagus just above the GE junction. The thyroid is grossly unremarkable. Lungs/Pleura: Right upper lobe pulmonary mass appears more fragmented and measures approximately 4.4 x 5.1 cm. There is subpleural soft tissue thickening extending inferiorly along the anterior pleura of the right upper lobe and right middle lobe, likely representing extension of disease. There is persistent interstitial thickening in the right upper lobe and right middle lobe suggestive of lymphangitic spread of disease. Interval development of interstitial thickening in the left lung, with upper lobe predominance. Stable moderate in size left pleural effusion. The right pleural effusion demonstrates loculations. Upper Abdomen: Interval development of water density pleural effusion, moderate. Musculoskeletal: No chest wall abnormality. No acute or significant osseous findings. Review of the MIP images confirms the above  findings. IMPRESSION: No evidence of pulmonary embolus. Previously demonstrated right upper lobe malignancy appears more fragmented and measures slightly smaller at 5.1 cm in greatest dimension. Progression of lymphangitic spread of disease in the right  upper and right middle lobes. Interval development of interstitial thickening throughout the left lung with upper lobe predominance. This may represent contralateral lymphangitic spread of malignancy versus development of pulmonary edema. Interval development of loculations within the known right pleural effusion. Stable moderate in size left pleural effusion. Interval development of abdominal ascites. Stable thickening of the distal esophagus. Electronically Signed   By: Fidela Salisbury M.D.   On: 04/28/2017 19:59   Dg Chest Port 1 View  Result Date: 05/04/2017 CLINICAL DATA:  History of esophageal carcinoma with metastatic involvement of the lungs, recent chemotherapy, now with cardiac issues EXAM: PORTABLE CHEST 1 VIEW COMPARISON:  Portable chest x-ray of 05/02/2016, and CT chest of 04/28/2017 FINDINGS: There are bilateral pleural effusions with mild cardiomegaly and minimal congestion somewhat improved compared to the chest x-ray of 05/02/2017. These findings again are most consistent with mild CHF. There is also parenchymal infiltrate in the right upper lobe consistent with loculated effusion and known malignancy. Right-sided Port-A-Cath remains with the tip overlying the lower SVC. No bony abnormality is noted. IMPRESSION: 1. Little change in bilateral effusions and some haziness throughout the lungs which may indicate residual although slightly improved CHF. 2. Parenchymal opacity in the right upper lobe peripherally consistent with loculated effusion and known malignancy when compared to the recent CT of the chest. Electronically Signed   By: Ivar Drape M.D.   On: 05/04/2017 09:44   Dg Chest Port 1 View  Result Date: 05/02/2017 CLINICAL DATA:  Followup pulmonary edema. EXAM: PORTABLE CHEST 1 VIEW COMPARISON:  05/01/2017 FINDINGS: Power port on the right is unchanged with tip at the SVC RA junction. Small pleural effusions persist. Abnormal bilateral lung density which could be due to a combination  of edema, collapse and pneumonia persists. This is worse on the right the left. Findings show slight worsening over time. IMPRESSION: Slow worsening bilateral effusions, atelectasis and areas airspace filling right worse left. Electronically Signed   By: Nelson Chimes M.D.   On: 05/02/2017 07:02   Dg Chest Port 1 View  Result Date: 05/01/2017 CLINICAL DATA:  Pleural effusion.  History of esophageal carcinoma EXAM: PORTABLE CHEST 1 VIEW COMPARISON:  Chest radiograph and chest CT April 28, 2017 FINDINGS: There is consolidation in the right upper lobe consistent with mass and associated infiltrate. There is airspace opacification in the left upper lobe which appears slightly more prominent compared to recent studies, felt to most likely represent progression of pneumonia or possibly aspiration in the left upper lobe. There are stable pleural effusions bilaterally with bibasilar atelectatic change. Heart is upper normal in size with pulmonary vascularity within normal limits. Port-A-Cath tip is in the superior vena cava near the cavoatrial junction. No pneumothorax. There is adenopathy in the right paratracheal region, better seen on recent CT. IMPRESSION: Increase consolidation left upper lobe consistent with progression of pneumonia. Consolidation with mass right upper lobe persists. Pleural effusions bilaterally appears stable with bibasilar atelectatic change. Stable cardiac prominence. Right paratracheal adenopathy persist, better delineated on recent CT. Electronically Signed   By: Lowella Grip III M.D.   On: 05/01/2017 07:47   Dg Chest Port 1 View  Result Date: 04/28/2017 CLINICAL DATA:  Shortness of breath EXAM: PORTABLE CHEST 1 VIEW COMPARISON:  04/24/2017, CT chest 04/21/2017 FINDINGS: Right-sided central  venous port tip over the SVC. Small left greater than right pleural effusion, similar on the left slight increase on the right. Progressive consolidation in the right upper lung. Stable  cardiomediastinal silhouette. Vascular congestion. Diffuse interstitial opacity as before. IMPRESSION: 1. Similar appearance of left pleural effusion. Slight increased small right pleural effusion 2. Worsening consolidation in the right mid to upper lung which may reflect combination of mass and adjacent pneumonia or inflammatory process 3. Similar appearance of diffuse interstitial disease as compared with recent priors. Electronically Signed   By: Donavan Foil M.D.   On: 04/28/2017 18:28   US Thoracentesis Asp Pleural Space W/img Guide  Result Date: 04/24/2017 INDICATION: Metastatic esophageal cancer, dyspnea, right pleural effusion. Request made for diagnostic and therapeutic right thoracentesis. EXAM: ULTRASOUND GUIDED DIAGNOSTIC AND THERAPEUTIC RIGHT THORACENTESIS MEDICATIONS: None COMPLICATIONS: None immediate. PROCEDURE: An ultrasound guided thoracentesis was thoroughly discussed with the patient and questions answered. The benefits, risks, alternatives and complications were also discussed. The patient understands and wishes to proceed with the procedure. Written consent was obtained. Ultrasound was performed to localize and mark an adequate pocket of fluid in the right chest. The area was then prepped and draped in the normal sterile fashion. 1% Lidocaine was used for local anesthesia. Under ultrasound guidance a 6 Fr Safe-T-Centesis catheter was introduced. Thoracentesis was performed. The catheter was removed and a dressing applied. FINDINGS: A total of approximately 2 liters of bloody fluid was removed. Samples were sent to the laboratory as requested by the clinical team. IMPRESSION: Successful ultrasound guided diagnostic and therapeutic right thoracentesis yielding 2 liters of pleural fluid. Dr. Benay Spice was notified of the above findings. Follow-up chest x-ray revealed no pneumothorax. Read by: Rowe Robert, PA-C Electronically Signed   By: Marybelle Killings M.D.   On: 04/24/2017 14:22     Past  medical hx Past Medical History:  Diagnosis Date  . Elevated LFTs   . Esophageal mass   . Pleural effusion on left   . Posterior tibial tendon dysfunction, right   . Rheumatoid arthritis ([place])   . Seasonal allergic rhinitis   . Severe esophageal dysplasia   . Tobacco use      Social History   Tobacco Use  . Smoking status: Former Smoker    Types: Cigarettes    Last attempt to quit: 03/03/2001    Years since quitting: 16.2  . Smokeless tobacco: Never Used  Substance Use Topics  . Alcohol use: Yes    Comment: social  . Drug use: No    Mr.Vigna reports that he quit smoking about 16 years ago. His smoking use included cigarettes. He has never used smokeless tobacco. He reports that he drinks alcohol. He reports that he does not use drugs.  Tobacco Cessation: Former smoker quit 03/03/2001. NO pack year history is recorded  Past surgical hx, Family hx, Social hx all reviewed.  Current Outpatient Medications on File Prior to Visit  Medication Sig  . acetaminophen (TYLENOL) 500 MG tablet Take 2 tablets (1,000 mg total) by mouth every 6 (six) hours as needed for mild pain, fever or headache.  Marland Kitchen amiodarone (PACERONE) 200 MG tablet Take 2 Tabs (400 mg) twice a day for 7 days, then 200 mg twice a day for 1 week, then 200 mg daily after that (Patient taking differently: 200 mg daily. )  . furosemide (LASIX) 20 MG tablet Take 1 tablet (20 mg total) by mouth daily.  Marland Kitchen HYDROcodone-acetaminophen (NORCO/VICODIN) 5-325 MG tablet Take 1 tablet by mouth  every 4 (four) hours as needed for moderate pain or severe pain.   Marland Kitchen lidocaine-prilocaine (EMLA) cream Apply 1 application topically as needed. Apply a small amount of emla cream on a cotton ball and place cream over port site 1-2 hours prior to treatmetn. Do not rub in . Cover with plastic wrap  . morphine (MS CONTIN) 15 MG 12 hr tablet Take 1 tablet (15 mg total) by mouth every 12 (twelve) hours.  . pantoprazole (PROTONIX) 40 MG tablet TAKE ONE (1)  TABLET BY MOUTH EVERY DAY  . predniSONE (DELTASONE) 10 MG tablet Take 20 mg daily for 4 days, then 10 mg daily for 4 days, then back to 5 mg daily indefinitely (Patient taking differently: 5 mg. Take 20 mg daily for 4 days, then 10 mg daily for 4 days, then back to 5 mg daily indefinitely)  . prochlorperazine (COMPAZINE) 5 MG tablet Take 1 tablet (5 mg total) by mouth every 6 (six) hours as needed for nausea or vomiting.   No current facility-administered medications on file prior to visit.      Allergies  Allergen Reactions  . Chantix [Varenicline] Other (See Comments)    Vivid dreams and lethargic  . Methotrexate Derivatives     Caused pleural effusions  . Shellfish Allergy Hives  . Sulfonamide Derivatives Rash    Review Of Systems:  Constitutional:   +  weight loss, No night sweats,  Fevers, chills, + fatigue, or  lassitude.  HEENT:   No headaches,  Difficulty swallowing,  Tooth/dental problems, or  Sore throat,                No sneezing, itching, ear ache, nasal congestion, + post nasal drip, dry sinuses  CV:  No chest pain,  Orthopnea, PND, + swelling in lower extremities, anasarca, dizziness, palpitations, syncope.   GI  No heartburn, indigestion, abdominal pain, nausea, vomiting, diarrhea, change in bowel habits, + loss of appetite, bloody stools.   Resp: + shortness of breath with exertion or at rest.  + excess mucus, + productive cough,  No non-productive cough,  No coughing up of blood.  + yellow change in color of mucus.  No wheezing.  No chest wall deformity  Skin: no rash or lesions.  GU: no dysuria, change in color of urine, no urgency or frequency.  No flank pain, no hematuria   MS:  No joint pain or swelling.  No decreased range of motion.  No back pain.  Psych:  No change in mood or affect. No depression or anxiety.  No memory loss.   Vital Signs BP 100/60 (BP Location: Right Arm, Cuff Size: Normal)   Pulse 98   Ht 5\' 8"  (1.727 m)   Wt 158 lb (71.7 kg)    SpO2 93%   BMI 24.02 kg/m    Physical Exam:  General- No distress,  A&Ox3, pleasant frail male in wheelchair wearing oxygen ENT: No sinus tenderness, TM clear, pale nasal mucosa, no oral exudate,+ post nasal drip, no LAN Cardiac: S1, S2, irregularly regular rate and rhythm, no murmur, rub, gallop Chest: No wheeze/ rales/ dullness; no accessory muscle use, no nasal flaring, no sternal retractions, diminished per bases L>R Abd.: Soft Non-tender, ND, BS + Ext: No clubbing cyanosis, 1-2 + BLE edema Neuro:  Deconditioned at baseline, A&O x 3, MAE x 4 Skin: No rashes, warm and dry Psych: depressed and withdrawn   Assessment/Plan  Atrial fibrillation with RVR (HCC) Better on Amio Plan: Needs cards follow  up for eval and eventual transitioning off Amio We will refer you to [employer] cards ( [facility] or [employer]) Follow up with Dr. Halford Chessman or Judson Roch NP in 3-4 weeks Please contact office for sooner follow up if symptoms do not improve or worsen or seek emergency care     Chronic pleural effusion 05/22/2017 CXR>> Interval increase in airspace opacity in the right upper lobe which is a known area of loculated effusion with parenchymal malignancy. 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 Stable small to moderate pleural effusions greatest on the left.  States he is comfortable breathing on his oxygen Compliant with lasix daily Stable small to moderate pleural effusions L>R Increased RUL loculated effusion Effusions will need close monitoring>> Close follow up of clinical s/s, weight Plan: We will need to follow CXR for worsening Continued discussion about pleurex as needed Follow up CXR in 2 weeks  ( at cancer center ) Continue Lasix as you have been doing. Follow up after CXR in 2-3 weeks with Dr. Halford Chessman or Antolin Belsito Please contact office for sooner follow up if symptoms do not improve or  worsen or seek emergency care      Sinusitis Sinusitis in immunocompromised patient Afebrile PND Plan: Augmentin 875 mg twice daily for sinus infection. Please use probiotics while on antibiotics Hydromet 5cc's at bedtime for cough. We will order humidification for all oxygen portable and home Xlear Sinus spray. Follow up CXR in 2 weeks  ( at cancer center ) Follow up in 2-3 weeks with Dr. 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 Benay Spice Taxol/ Carboplatin, K Mariana Kaufman, NP 05/22/2017  6:01 PM

## 2017-05-22 NOTE — Telephone Encounter (Signed)
Call placed to patient's wife to attempt to schedule patient with Palliative care

## 2017-05-22 NOTE — Assessment & Plan Note (Signed)
Continue chemo and immunotherapy treatment per Dr. Benay Spice Taxol/ Carboplatin, Raliegh Ip Vesta Mixer

## 2017-05-23 ENCOUNTER — Telehealth: Payer: Self-pay

## 2017-05-23 NOTE — Telephone Encounter (Signed)
Received message to contact patient's wife to schedule visit with NP. Message left on Luanne cell phone

## 2017-05-24 ENCOUNTER — Inpatient Hospital Stay: Payer: No Typology Code available for payment source

## 2017-05-24 ENCOUNTER — Telehealth: Payer: Self-pay

## 2017-05-24 ENCOUNTER — Encounter: Payer: Self-pay | Admitting: Nurse Practitioner

## 2017-05-24 VITALS — BP 106/75 | HR 97 | Temp 98.5°F | Resp 17 | Ht 68.0 in | Wt 158.0 lb

## 2017-05-24 DIAGNOSIS — C155 Malignant neoplasm of lower third of esophagus: Secondary | ICD-10-CM

## 2017-05-24 DIAGNOSIS — Z5111 Encounter for antineoplastic chemotherapy: Secondary | ICD-10-CM | POA: Diagnosis not present

## 2017-05-24 LAB — CBC WITH DIFFERENTIAL (CANCER CENTER ONLY)
Basophils Absolute: 0.1 10*3/uL (ref 0.0–0.1)
Basophils Relative: 1 %
EOS ABS: 0.1 10*3/uL (ref 0.0–0.5)
EOS PCT: 1 %
HCT: 30.2 % — ABNORMAL LOW (ref 38.4–49.9)
Hemoglobin: 9.7 g/dL — ABNORMAL LOW (ref 13.0–17.1)
LYMPHS ABS: 0.7 10*3/uL — AB (ref 0.9–3.3)
LYMPHS PCT: 13 %
MCH: 28.7 pg (ref 27.2–33.4)
MCHC: 32.3 g/dL (ref 32.0–36.0)
MCV: 88.9 fL (ref 79.3–98.0)
MONO ABS: 0.5 10*3/uL (ref 0.1–0.9)
Monocytes Relative: 9 %
Neutro Abs: 4.3 10*3/uL (ref 1.5–6.5)
Neutrophils Relative %: 76 %
Platelet Count: 302 10*3/uL (ref 140–400)
RBC: 3.39 MIL/uL — ABNORMAL LOW (ref 4.20–5.82)
RDW: 19.4 % — AB (ref 11.0–14.6)
Smear Review: 1
WBC Count: 5.7 10*3/uL (ref 4.0–10.3)

## 2017-05-24 LAB — CMP (CANCER CENTER ONLY)
ALK PHOS: 263 U/L — AB (ref 40–150)
ALT: 32 U/L (ref 0–55)
AST: 36 U/L — AB (ref 5–34)
Albumin: 2.4 g/dL — ABNORMAL LOW (ref 3.5–5.0)
Anion gap: 9 (ref 3–11)
BUN: 18 mg/dL (ref 7–26)
CALCIUM: 9.2 mg/dL (ref 8.4–10.4)
CHLORIDE: 99 mmol/L (ref 98–109)
CO2: 33 mmol/L — AB (ref 22–29)
CREATININE: 0.72 mg/dL (ref 0.70–1.30)
Glucose, Bld: 97 mg/dL (ref 70–140)
Potassium: 3.8 mmol/L (ref 3.5–5.1)
SODIUM: 141 mmol/L (ref 136–145)
Total Bilirubin: 0.3 mg/dL (ref 0.2–1.2)
Total Protein: 6.2 g/dL — ABNORMAL LOW (ref 6.4–8.3)

## 2017-05-24 MED ORDER — FAMOTIDINE IN NACL 20-0.9 MG/50ML-% IV SOLN
20.0000 mg | Freq: Once | INTRAVENOUS | Status: AC
  Administered 2017-05-24: 20 mg via INTRAVENOUS

## 2017-05-24 MED ORDER — SODIUM CHLORIDE 0.9 % IV SOLN
Freq: Once | INTRAVENOUS | Status: AC
  Administered 2017-05-24: 15:00:00 via INTRAVENOUS

## 2017-05-24 MED ORDER — DIPHENHYDRAMINE HCL 50 MG/ML IJ SOLN
25.0000 mg | Freq: Once | INTRAMUSCULAR | Status: AC
  Administered 2017-05-24: 25 mg via INTRAVENOUS

## 2017-05-24 MED ORDER — FAMOTIDINE IN NACL 20-0.9 MG/50ML-% IV SOLN
INTRAVENOUS | Status: AC
  Filled 2017-05-24: qty 50

## 2017-05-24 MED ORDER — SODIUM CHLORIDE 0.9 % IV SOLN
80.0000 mg/m2 | Freq: Once | INTRAVENOUS | Status: AC
  Administered 2017-05-24: 150 mg via INTRAVENOUS
  Filled 2017-05-24: qty 25

## 2017-05-24 MED ORDER — HEPARIN SOD (PORK) LOCK FLUSH 100 UNIT/ML IV SOLN
500.0000 [IU] | Freq: Once | INTRAVENOUS | Status: AC | PRN
  Administered 2017-05-24: 500 [IU]
  Filled 2017-05-24: qty 5

## 2017-05-24 MED ORDER — DIPHENHYDRAMINE HCL 50 MG/ML IJ SOLN
INTRAMUSCULAR | Status: AC
  Filled 2017-05-24: qty 1

## 2017-05-24 MED ORDER — DEXAMETHASONE SODIUM PHOSPHATE 10 MG/ML IJ SOLN
INTRAMUSCULAR | Status: AC
  Filled 2017-05-24: qty 1

## 2017-05-24 MED ORDER — SODIUM CHLORIDE 0.9 % IV SOLN
246.0000 mg | Freq: Once | INTRAVENOUS | Status: AC
  Administered 2017-05-24: 250 mg via INTRAVENOUS
  Filled 2017-05-24: qty 25

## 2017-05-24 MED ORDER — DEXAMETHASONE SODIUM PHOSPHATE 10 MG/ML IJ SOLN
10.0000 mg | Freq: Once | INTRAMUSCULAR | Status: AC
  Administered 2017-05-24: 10 mg via INTRAVENOUS

## 2017-05-24 MED ORDER — PALONOSETRON HCL INJECTION 0.25 MG/5ML
0.2500 mg | Freq: Once | INTRAVENOUS | Status: AC
  Administered 2017-05-24: 0.25 mg via INTRAVENOUS

## 2017-05-24 MED ORDER — PALONOSETRON HCL INJECTION 0.25 MG/5ML
INTRAVENOUS | Status: AC
Start: 2017-05-24 — End: 2017-05-24
  Filled 2017-05-24: qty 5

## 2017-05-24 MED ORDER — SODIUM CHLORIDE 0.9% FLUSH
10.0000 mL | INTRAVENOUS | Status: DC | PRN
  Administered 2017-05-24: 10 mL
  Filled 2017-05-24: qty 10

## 2017-05-24 NOTE — Telephone Encounter (Signed)
Phone call placed to patient's wife to schedule visit with Palliative Care. Visit scheduled for 05/25/17 @ 9 am.

## 2017-05-24 NOTE — Patient Instructions (Signed)
[facility] Discharge Instructions for Patients Receiving Chemotherapy  Today you received the following chemotherapy agents:  Taxol, Carboplatin  To help prevent nausea and vomiting after your treatment, we encourage you to take your nausea medication as prescribed.   If you develop nausea and vomiting that is not controlled by your nausea medication, call the clinic.   BELOW ARE SYMPTOMS THAT SHOULD BE REPORTED IMMEDIATELY:  *FEVER GREATER THAN 100.5 F  *CHILLS WITH OR WITHOUT FEVER  NAUSEA AND VOMITING THAT IS NOT CONTROLLED WITH YOUR NAUSEA MEDICATION  *UNUSUAL SHORTNESS OF BREATH  *UNUSUAL BRUISING OR BLEEDING  TENDERNESS IN MOUTH AND THROAT WITH OR WITHOUT PRESENCE OF ULCERS  *URINARY PROBLEMS  *BOWEL PROBLEMS  UNUSUAL RASH Items with * indicate a potential emergency and should be followed up as soon as possible.  Feel free to call the clinic should you have any questions or concerns. The clinic phone number is [phone].  Please show the CHEMO ALERT CARD at check-in to the Emergency Department and triage nurse.   

## 2017-05-25 ENCOUNTER — Other Ambulatory Visit: Payer: Self-pay | Admitting: Hospice and Palliative Medicine

## 2017-05-25 DIAGNOSIS — Z515 Encounter for palliative care: Secondary | ICD-10-CM

## 2017-05-25 NOTE — Progress Notes (Signed)
PALLIATIVE CARE CONSULT VISIT   PATIENT NAMEANSHUL Todd DOB: 07-Mar-1955 [name]: [id number]  PRIMARY CARE PROVIDER: [name], MD  REFERRING PROVIDER: [name], MD [address] [place], [place] [address]  RESPONSIBLE PARTY: self   RECOMMENDATIONS and PLAN:  1. Fatigue - this is patient's primary complaint today. He says that he is feeling somewhat improved on higher doses of steroids. However, he is anticipating worsening fatigue once steroids are tapered back to chronic dosing. Would recommend trial of methylphenidate if ok with oncology.  2. Weakness - he lives at home with wife and son. He is ambulatory in the home without assistance and uses a wheelchair outside the home. No falls reported. Patient is able to bathe and dress himself.  3. Pain - stable currently. He has prn hydrocodone, morphine, and oxycodone in the home if needed.  4. GOC - patient is planning for continued treatment of the cancer in the near future with hope that he will have clinical improvement. However, both he and wife speak candidly about not knowing how he will do going forward, although they both recognize that the cancer is not curable. Patient says "I know I will die from this, just not when." He would like to improve his quality of life, as he spends most of the day in his room due to fatigue. We talked about social support and engagement in activities outside the home. We talked about future use of hospice in the event of clinical decline or discontinuation of treatment.  5. ACP - I completed both a MOST form and DNR today per patient's request. He specified his desire for DNAR, limited scope of treatment, and is ok with IVFs/ABX if indicated. He was unsure about a feeding tube and said that it would depend upon his prognosis at the time. Wife says she agrees with these decisions. She is his HCPOA in the event that he cannot make his own decisions  I spent 60 minutes providing this  consultation,  from 0900 to 1000. More than 50% of the time in this consultation was spent coordinating communication.   HISTORY OF PRESENT ILLNESS:  Grant Todd is a 62 y.o. 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 Pleural effusion. Patient saw pulmonology this week and was started on oral antibiotics for sinus infection. He reports ongoing fatigue and dyspnea. CXR on 4/22 shows interval increased RUL effusion and probable worsening lymphangitis spread. Palliative Care was requested by family to help address goals of care. Today, I met with patient and his wife in the home.   CODE STATUS: DNR  PPS: 40% HOSPICE ELIGIBILITY/DIAGNOSIS: NO - due to ongoing chemotx  PAST MEDICAL HISTORY:  Past Medical History:  Diagnosis Date  . Elevated LFTs   . Esophageal mass   . Pleural effusion on left   . Posterior tibial tendon dysfunction, right   . Rheumatoid arthritis ([place])   . Seasonal allergic rhinitis   . Severe esophageal dysplasia   . Tobacco use     SOCIAL HX:  Social History   Tobacco Use  . Smoking status: Former Smoker    Types: Cigarettes    Last attempt to quit: 03/03/2001    Years since quitting: 16.2  . Smokeless tobacco:  Never Used  Substance Use Topics  . Alcohol use: Yes    Comment: social    ALLERGIES:  Allergies  Allergen Reactions  . Chantix [Varenicline] Other (See Comments)    Vivid dreams and lethargic  . Methotrexate Derivatives     Caused pleural effusions  . Shellfish Allergy Hives  . Sulfonamide Derivatives Rash     PERTINENT MEDICATIONS:  Outpatient Encounter Medications as of 05/25/2017  Medication Sig  . acetaminophen (TYLENOL) 500 MG tablet Take 2 tablets (1,000 mg  total) by mouth every 6 (six) hours as needed for mild pain, fever or headache.  Marland Kitchen amiodarone (PACERONE) 200 MG tablet Take 2 Tabs (400 mg) twice a day for 7 days, then 200 mg twice a day for 1 week, then 200 mg daily after that (Patient taking differently: 200 mg daily. )  . amoxicillin-clavulanate (AUGMENTIN) 875-125 MG tablet Take 1 tablet by mouth 2 (two) times daily.  . furosemide (LASIX) 20 MG tablet Take 1 tablet (20 mg total) by mouth daily.  Marland Kitchen HYDROcodone-acetaminophen (NORCO/VICODIN) 5-325 MG tablet Take 1 tablet by mouth every 4 (four) hours as needed for moderate pain or severe pain.   Marland Kitchen HYDROcodone-homatropine (HYDROMET) 5-1.5 MG/5ML syrup Take 5 mLs by mouth every 6 (six) hours as needed for cough.  . lidocaine-prilocaine (EMLA) cream Apply 1 application topically as needed. Apply a small amount of emla cream on a cotton ball and place cream over port site 1-2 hours prior to treatmetn. Do not rub in . Cover with plastic wrap  . morphine (MS CONTIN) 15 MG 12 hr tablet Take 1 tablet (15 mg total) by mouth every 12 (twelve) hours.  . pantoprazole (PROTONIX) 40 MG tablet TAKE ONE (1) TABLET BY MOUTH EVERY DAY  . predniSONE (DELTASONE) 10 MG tablet Take 20 mg daily for 4 days, then 10 mg daily for 4 days, then back to 5 mg daily indefinitely (Patient taking differently: 5 mg. Take 20 mg daily for 4 days, then 10 mg daily for 4 days, then back to 5 mg daily indefinitely)  . prochlorperazine (COMPAZINE) 5 MG tablet Take 1 tablet (5 mg total) by mouth every 6 (six) hours as needed for nausea or vomiting.   No facility-administered encounter medications on file as of 05/25/2017.     PHYSICAL EXAM:   General: NAD, frail appearing, thin Cardiovascular: regular rate and rhythm Pulmonary: poor air movement without wheeze, on O2 Abdomen: soft, nontender, + bowel sounds GU: no suprapubic tenderness Extremities: trace pedal edema, no joint deformities Skin: no rashes Neurological: Weakness but  otherwise nonfocal  Irean Hong, NP

## 2017-05-26 ENCOUNTER — Telehealth: Payer: Self-pay | Admitting: *Deleted

## 2017-05-26 NOTE — Telephone Encounter (Signed)
Attempted to call pt on both home and cell numbers x 2  without answer.  Call wife on both home and cell numbers x 2  without answer.  Left message on voice mail requesting a call back to nurse.  Sent reply message to wife also.

## 2017-05-27 ENCOUNTER — Other Ambulatory Visit: Payer: Self-pay | Admitting: Oncology

## 2017-05-29 NOTE — Telephone Encounter (Signed)
Spoke with pt's wife, she would like to keep 5/2 appointment. She just wanted MD to be aware of chest Xray result to discuss at next visit. Wife reports they are seeing PCP to change amiodarone to beta blocker on 4/30. She will call office if needed prior to 5/2.

## 2017-05-31 ENCOUNTER — Other Ambulatory Visit: Payer: Self-pay | Admitting: *Deleted

## 2017-05-31 DIAGNOSIS — C155 Malignant neoplasm of lower third of esophagus: Secondary | ICD-10-CM

## 2017-06-01 ENCOUNTER — Inpatient Hospital Stay: Payer: No Typology Code available for payment source

## 2017-06-01 ENCOUNTER — Encounter: Payer: Self-pay | Admitting: Nurse Practitioner

## 2017-06-01 ENCOUNTER — Inpatient Hospital Stay: Payer: No Typology Code available for payment source | Attending: Oncology | Admitting: Nurse Practitioner

## 2017-06-01 VITALS — BP 122/78 | HR 101 | Temp 99.3°F | Resp 22

## 2017-06-01 DIAGNOSIS — C155 Malignant neoplasm of lower third of esophagus: Secondary | ICD-10-CM

## 2017-06-01 DIAGNOSIS — I4891 Unspecified atrial fibrillation: Secondary | ICD-10-CM | POA: Diagnosis not present

## 2017-06-01 DIAGNOSIS — M069 Rheumatoid arthritis, unspecified: Secondary | ICD-10-CM | POA: Diagnosis not present

## 2017-06-01 DIAGNOSIS — Z95828 Presence of other vascular implants and grafts: Secondary | ICD-10-CM

## 2017-06-01 DIAGNOSIS — R05 Cough: Secondary | ICD-10-CM | POA: Insufficient documentation

## 2017-06-01 DIAGNOSIS — Z5111 Encounter for antineoplastic chemotherapy: Secondary | ICD-10-CM | POA: Diagnosis present

## 2017-06-01 DIAGNOSIS — C787 Secondary malignant neoplasm of liver and intrahepatic bile duct: Secondary | ICD-10-CM | POA: Diagnosis not present

## 2017-06-01 DIAGNOSIS — C7951 Secondary malignant neoplasm of bone: Secondary | ICD-10-CM | POA: Diagnosis not present

## 2017-06-01 DIAGNOSIS — J90 Pleural effusion, not elsewhere classified: Secondary | ICD-10-CM | POA: Insufficient documentation

## 2017-06-01 DIAGNOSIS — R0600 Dyspnea, unspecified: Secondary | ICD-10-CM | POA: Diagnosis not present

## 2017-06-01 DIAGNOSIS — R748 Abnormal levels of other serum enzymes: Secondary | ICD-10-CM | POA: Insufficient documentation

## 2017-06-01 LAB — CBC WITH DIFFERENTIAL (CANCER CENTER ONLY)
Basophils Absolute: 0 10*3/uL (ref 0.0–0.1)
Basophils Relative: 0 %
Eosinophils Absolute: 0 10*3/uL (ref 0.0–0.5)
Eosinophils Relative: 0 %
HCT: 29.9 % — ABNORMAL LOW (ref 38.4–49.9)
Hemoglobin: 9.6 g/dL — ABNORMAL LOW (ref 13.0–17.1)
LYMPHS ABS: 0.8 10*3/uL — AB (ref 0.9–3.3)
LYMPHS PCT: 9 %
MCH: 28.9 pg (ref 27.2–33.4)
MCHC: 32.1 g/dL (ref 32.0–36.0)
MCV: 90.1 fL (ref 79.3–98.0)
MONO ABS: 1 10*3/uL — AB (ref 0.1–0.9)
Monocytes Relative: 12 %
Neutro Abs: 6.6 10*3/uL — ABNORMAL HIGH (ref 1.5–6.5)
Neutrophils Relative %: 79 %
Platelet Count: 396 10*3/uL (ref 140–400)
RBC: 3.32 MIL/uL — AB (ref 4.20–5.82)
RDW: 20.2 % — AB (ref 11.0–14.6)
WBC: 8.4 10*3/uL (ref 4.0–10.3)

## 2017-06-01 LAB — CMP (CANCER CENTER ONLY)
ALBUMIN: 2.4 g/dL — AB (ref 3.5–5.0)
ALT: 130 U/L — AB (ref 0–55)
AST: 69 U/L — AB (ref 5–34)
Alkaline Phosphatase: 1475 U/L — ABNORMAL HIGH (ref 40–150)
Anion gap: 10 (ref 3–11)
BUN: 17 mg/dL (ref 7–26)
CHLORIDE: 98 mmol/L (ref 98–109)
CO2: 30 mmol/L — AB (ref 22–29)
Calcium: 9.7 mg/dL (ref 8.4–10.4)
Creatinine: 0.73 mg/dL (ref 0.70–1.30)
GFR, Est AFR Am: >60 mL/min (ref >60)
GFR, Estimated: >60 mL/min (ref >60)
Glucose, Bld: 119 mg/dL (ref 70–140)
POTASSIUM: 3.9 mmol/L (ref 3.5–5.1)
SODIUM: 138 mmol/L (ref 136–145)
Total Bilirubin: 0.4 mg/dL (ref 0.2–1.2)
Total Protein: 6.7 g/dL (ref 6.4–8.3)

## 2017-06-01 MED ORDER — FAMOTIDINE IN NACL 20-0.9 MG/50ML-% IV SOLN
20.0000 mg | Freq: Once | INTRAVENOUS | Status: AC
Start: 2017-06-01 — End: 2017-06-01
  Administered 2017-06-01: 20 mg via INTRAVENOUS

## 2017-06-01 MED ORDER — SODIUM CHLORIDE 0.9% FLUSH
10.0000 mL | INTRAVENOUS | Status: DC | PRN
  Administered 2017-06-01: 10 mL
  Filled 2017-06-01: qty 10

## 2017-06-01 MED ORDER — DEXAMETHASONE SODIUM PHOSPHATE 10 MG/ML IJ SOLN
INTRAMUSCULAR | Status: AC
  Filled 2017-06-01: qty 1

## 2017-06-01 MED ORDER — PALONOSETRON HCL INJECTION 0.25 MG/5ML
INTRAVENOUS | Status: AC
  Filled 2017-06-01: qty 5

## 2017-06-01 MED ORDER — HEPARIN SOD (PORK) LOCK FLUSH 100 UNIT/ML IV SOLN
500.0000 [IU] | Freq: Once | INTRAVENOUS | Status: AC | PRN
  Administered 2017-06-01: 500 [IU]
  Filled 2017-06-01: qty 5

## 2017-06-01 MED ORDER — PALONOSETRON HCL INJECTION 0.25 MG/5ML
0.2500 mg | Freq: Once | INTRAVENOUS | Status: AC
  Administered 2017-06-01: 0.25 mg via INTRAVENOUS

## 2017-06-01 MED ORDER — SODIUM CHLORIDE 0.9% FLUSH
10.0000 mL | Freq: Once | INTRAVENOUS | Status: DC
  Filled 2017-06-01: qty 10

## 2017-06-01 MED ORDER — DIPHENHYDRAMINE HCL 50 MG/ML IJ SOLN
INTRAMUSCULAR | Status: AC
  Filled 2017-06-01: qty 1

## 2017-06-01 MED ORDER — DEXAMETHASONE SODIUM PHOSPHATE 10 MG/ML IJ SOLN
10.0000 mg | Freq: Once | INTRAMUSCULAR | Status: AC
  Administered 2017-06-01: 10 mg via INTRAVENOUS

## 2017-06-01 MED ORDER — ALTEPLASE 2 MG IJ SOLR
INTRAMUSCULAR | Status: AC
Start: 2017-06-01 — End: 2017-06-01
  Filled 2017-06-01: qty 2

## 2017-06-01 MED ORDER — ALTEPLASE 2 MG IJ SOLR
2.0000 mg | Freq: Once | INTRAMUSCULAR | Status: AC
  Administered 2017-06-01: 2 mg
  Filled 2017-06-01: qty 2

## 2017-06-01 MED ORDER — DIPHENHYDRAMINE HCL 50 MG/ML IJ SOLN
25.0000 mg | Freq: Once | INTRAMUSCULAR | Status: AC
  Administered 2017-06-01: 25 mg via INTRAVENOUS

## 2017-06-01 MED ORDER — FAMOTIDINE IN NACL 20-0.9 MG/50ML-% IV SOLN
INTRAVENOUS | Status: AC
  Filled 2017-06-01: qty 50

## 2017-06-01 MED ORDER — SODIUM CHLORIDE 0.9 % IV SOLN
Freq: Once | INTRAVENOUS | Status: AC
  Administered 2017-06-01: 14:00:00 via INTRAVENOUS

## 2017-06-01 MED ORDER — SODIUM CHLORIDE 0.9 % IV SOLN
190.0000 mg | Freq: Once | INTRAVENOUS | Status: AC
  Administered 2017-06-01: 190 mg via INTRAVENOUS
  Filled 2017-06-01: qty 19

## 2017-06-01 MED ORDER — SODIUM CHLORIDE 0.9 % IV SOLN
80.0000 mg/m2 | Freq: Once | INTRAVENOUS | Status: AC
  Administered 2017-06-01: 150 mg via INTRAVENOUS
  Filled 2017-06-01: qty 25

## 2017-06-01 NOTE — Patient Instructions (Signed)
Alteplase, TPA injection What is this medicine? ALTEPLASE (AL te plase) can dissolve blood clots that form in the heart, blood vessels, or lungs after a heart attack. This medicine is also given to improve recovery and decrease the chance of disability in patients having symptoms of a stroke. This medicine may be used for other purposes; ask your health care provider or pharmacist if you have questions. COMMON BRAND NAME(S): Activase, Cathflo Activase What should I tell my health care provider before I take this medicine? They need to know if you have any of these conditions: -aneurysm -bleeding problems or problems with blood clotting -blood vessel disease or damaged blood vessels -diabetic retinopathy -head injury or tumor -high blood pressure -infection -irregular heartbeats -previous stroke -recent biopsy or surgery -an unusual or allergic reaction to alteplase, other medicines, foods, dyes, or preservatives -pregnant or trying to get pregnant -breast-feeding How should I use this medicine? This medicine is for injection into a vein. It is given by a health care professional in a hospital or clinic setting. Talk to your pediatrician regarding the use of this medicine in children. Special care may be needed. Overdosage: If you think you have taken too much of this medicine contact a poison control center or emergency room at once. NOTE: This medicine is only for you. Do not share this medicine with others. What if I miss a dose? This does not apply. What may interact with this medicine? Do not take this medicine with any of the following medications: -aminocaproic acid -aprotinin -tranexamic acid This medicine may also interact with the following medications: -antiinflammatory drugs, NSAIDs like ibuprofen -aspirin and aspirin-like medicines -blood thinners, like warfarin, heparin or enoxaparin -dipyridamole This list may not describe all possible interactions. Give your health  care provider a list of all the medicines, herbs, non-prescription drugs, or dietary supplements you use. Also tell them if you smoke, drink alcohol, or use illegal drugs. Some items may interact with your medicine. What should I watch for while using this medicine? Your condition will be monitored carefully while you are receiving this medicine. Follow the advice of your doctor or health care professional exactly. You may need bed rest to minimize the risk of bleeding. This medicine can make you bleed more easily. This effect can last for several days. Take special care brushing or flossing your teeth. Do not take aspirin, ibuprofen, or other nonprescription pain relievers during or for several days after alteplase treatment unless otherwise instructed by your doctor or health care professional. You may feel dizzy or lightheaded. To avoid the risk of dizzy or fainting spells, sit or stand up slowly, especially if you are an older patient. What side effects may I notice from receiving this medicine? Side effects that you should report to your doctor or health care professional as soon as possible: -allergic reactions like skin rash, itching or hives, swelling of the face, lips, or tongue -signs and symptoms of bleeding such as bloody or black, tarry stools; red or dark-brown urine; spitting up blood or brown material that looks like coffee grounds; red spots on the skin; unusual bruising or bleeding from the eye, gums, or nose -signs and symptoms of a stroke such as changes in vision; confusion; trouble speaking or understanding; severe headaches; sudden numbness or weakness of the face, arm, or leg; trouble walking; dizziness; loss of balance or coordination -slow or fast heart rate Side effects that usually do not require medical attention (report to your doctor or health care professional   if they continue or are bothersome): -dizziness -fever -nausea, vomiting This list may not describe all  possible side effects. Call your doctor for medical advice about side effects. You may report side effects to FDA at 1-800-FDA-1088. Where should I keep my medicine? This does not apply. You will not be given this medicine to store at home. NOTE: This sheet is a summary. It may not cover all possible information. If you have questions about this medicine, talk to your doctor, pharmacist, or health care provider.  2018 Elsevier/Gold Standard (2012-05-15 19:16:18)  

## 2017-06-01 NOTE — Progress Notes (Signed)
CMET reviewed by Dr. Benay Spice: OK to treat today, despite AST 130 and alk phos 1475. Tim, Infusion RN made aware.

## 2017-06-01 NOTE — Patient Instructions (Signed)
   [facility] Discharge Instructions for Patients Receiving Chemotherapy  Today you received the following chemotherapy agents Taxol and Carboplatin   To help prevent nausea and vomiting after your treatment, we encourage you to take your nausea medication as directed.    If you develop nausea and vomiting that is not controlled by your nausea medication, call the clinic.   BELOW ARE SYMPTOMS THAT SHOULD BE REPORTED IMMEDIATELY:  *FEVER GREATER THAN 100.5 F  *CHILLS WITH OR WITHOUT FEVER  NAUSEA AND VOMITING THAT IS NOT CONTROLLED WITH YOUR NAUSEA MEDICATION  *UNUSUAL SHORTNESS OF BREATH  *UNUSUAL BRUISING OR BLEEDING  TENDERNESS IN MOUTH AND THROAT WITH OR WITHOUT PRESENCE OF ULCERS  *URINARY PROBLEMS  *BOWEL PROBLEMS  UNUSUAL RASH Items with * indicate a potential emergency and should be followed up as soon as possible.  Feel free to call the clinic should you have any questions or concerns. The clinic phone number is [phone].  Please show the CHEMO ALERT CARD at check-in to the Emergency Department and triage nurse.   

## 2017-06-01 NOTE — Progress Notes (Addendum)
[facility] OFFICE PROGRESS NOTE   Diagnosis: Esophagus cancer  INTERVAL HISTORY:   Grant Todd returns as scheduled.  He completed cycle 3 weekly Taxol/carboplatin 05/24/2017.  Main complaint today is sinus congestion and dryness.  He has tried humidification with the oxygen but did not note improvement.  He has also tried Mucinex, Claritin, Zyrtec, fluticasone and most recently Nasacort and ipratropium.  He feels the sinus congestion is causing an increase in shortness of breath.  Oxygen is currently at 4 L/min.  No fever at home.  He notes temperature 99.3 in the office today.  Appetite and energy level are poor.  He notes a rash over the forearms.  He denies pain.  Objective:  Vital signs in last 24 hours:  Blood pressure 122/78, pulse (!) 101, temperature 99.3 F (37.4 C), temperature source Oral, resp. rate (!) 22, SpO2 93 %.    HEENT: No thrush or ulcers. Resp: Good air movement bilaterally.  Breath sounds diminished at the right upper lung field. Cardio: Regular rate and rhythm. GI: No hepatomegaly. Vascular: No significant leg edema. Neuro: Alert and oriented. Skin: Forearms with scattered purpura. Portacath without erythema.  Lab Results:  Lab Results  Component Value Date   WBC 8.4 06/01/2017   HGB 9.6 (L) 06/01/2017   HCT 29.9 (L) 06/01/2017   MCV 90.1 06/01/2017   PLT 396 06/01/2017   NEUTROABS 6.6 (H) 06/01/2017    Imaging:  No results found.  Medications: I have reviewed the patient's current medications.  Assessment/Plan: 1. Metastatic esophagus cancer ? Distal esophagus mass at upper endoscopy 12/15/2016, biopsy confirmed invasive poorly differentiated adenocarcinoma ? CTs of 02/16/2016-distal esophagus thickening, mediastinal/right hilar lymphadenopathy, abdominal/retroperitoneal lymphadenopathy, liver metastases ? MRI lumbar spine 12/20/2016-7 mm S1 lesion suspicious for metastasis, bulky retroperitoneal and prevertebral malignant  lymphadenopathy, leftpsoasmuscle metastasis, degenerative disease causing severe neural foraminal stenosis at right L4 and left L5 ? PET scan 12/27/2016-hypermetabolic mass at the gastroesophageal junction; extensive hypermetabolic adenopathy involving the right supraclavicular nodal station, mediastinal lymph nodes, retrocrural lymph nodes, periaortic lymph nodes, left iliac lymph nodes and central mesenteric lymph nodes; fine nodularity in the right upper lobe with hypermetabolic thickening in the right suprahilar peribronchial structures; hypermetabolic metastasis to the right hepatic lobe; several small skeletal metastasis including right iliac bone, left sacrum and left scapula; multiple soft tissue metastases to the subcutaneous tissues adjacent to the left scapular musculature, retroperitoneal nodal metastasis along the left psoas muscle and metastatic lesion within the left gluteal musculature. No lesion in the lumbar spine or spinal canal to explain the patient's acute right-sided leg pain. ? Ultrasound-guided biopsy of a right liver lesion 01/04/2017-poorly differentiated adenocarcinoma ? PD-L1 expression30% ? Cycle 1 FOLFOX 01/05/2017 ? Cycle 2 FOLFOX 01/19/2017 ? Cycle 3 FOLFOX1/03/2017 ? Cycle 4 FOLFOX 02/15/2017 ? Cycle 5 FOLFOX 03/01/2017 ? CTs 03/13/2017 decreased size and number of lymph nodes in the chest, abdomen, and retroperitoneum. Decreased size of largest liver lesion, slight increase in size of small liver lesion, slight increase in left pleural effusion, development of inflammatory appearing airspace disease ? Cycle 6 FOLFOX 03/15/2017 ? Cycle 7 FOLFOX 03/29/2017 ? Cycle 8 FOLFOX 04/17/2017 (Oxaliplatin discontinued) ? CT chest 04/21/2017-progression of thoracic and upper abdominal nodal metastases, masslike opacity in the right upper lobe-progressive with associated lymphangitic tumor spread in the right lung, new right pleural effusion, unchanged left pleural  effusion ? Thoracentesis 04/24/2017-metastatic adenocarcinoma ? Cycle 1 pembrolizumab 04/26/2017 ? Cycle 1 weekly Taxol/carboplatin 05/09/2017 ? Taxol/carboplatin/Pembrolizumab 05/17/2017 ? Week 3 Taxol/carboplatin  05/24/2017 ? Week 4 Taxol/carboplatin 06/01/2017 2. Painpossibly due to disc herniation-the pain may be related to retroperitoneal lymphadenopathy or bone metastases;plain x-ray right hip 12/21/2016 with no blastic or lytic bone lesions. No fracture or dislocation.Review of the lumbar spine MRI shows right L4 disc herniation which could be responsible for the right leg pain and weakness. Trial of dexamethasone initiatedwith improvement.  3.Rheumatoid arthritis  4.Dysphagia/weight loss secondary to #1-improved 02/01/2017.  5.Chronic left pleural effusion  6.Port-A-Cath placement 01/04/2017  7.Early oxaliplatin neuropathy-diminished vibratory sense;mildly decreased to intact 03/29/2017.  8.Right lung inflammatory changes noted on chest CT 03/13/2017;increased cough 04/12/2017. Chest x-ray 04/12/2017 with similar appearance left chronic pleural effusion. Diffuse airspace disease right lung similar to prior chest CT.  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 Atrial fibrillation  10.  Elevated liver enzymes 06/01/2017   Disposition: Grant Todd appears unchanged.  Plan to proceed with week 4 Taxol/carboplatin today as scheduled.  The plan is for restaging CTs after he has completed approximately 2 months of treatment with Taxol/carboplatin/Pembrolizumab.  We reviewed today's labs.  The liver enzymes are elevated.  These will be repeated in 1 week prior to cycle 3  Pembrolizumab.  He will return for cycle 3 Pembrolizumab 06/08/2017.  He will return for lab, follow-up and weekly Taxol/carboplatin on 06/15/2017.  He will contact the office in the interim with any problems.  Patient seen with Dr. Benay Spice.  Chest x-ray images from 05/22/2017 and 05/04/2017, CT images from 04/28/2017 reviewed with Grant Todd and his wife at today's visit.  25 minutes were spent face-to-face at today's visit with the majority of that time involved in counseling/coordination of care.    Ned Card ANP/GNP-BC   06/01/2017  1:38 PM  Shared visit with Ned Card.  Grant Todd is interviewed and examined.  His overall status appears unchanged.  We reviewed recent imaging studies and treatment options with him.  We discussed continuing the current therapy versus switching to comfort care.  He agrees to continue the current therapy for the next 3-4 weeks and then undergo a restaging chest CT.  Julieanne Manson, MD

## 2017-06-01 NOTE — Progress Notes (Signed)
1205: Port accessed without difficulty but unable to get blood return. Attempted to get blood return via multiple position changes including hands up, coughing, laying back unsuccessfully. Pt received 30 cc of saline via port flushes. Pt denied pain with saline flushes. Pt asking to have labs drawn peripherally via 23 gauge needle to Left AC without difficulty and sent to lab. Alteplase placed to port without difficulty. Charge RN aware.

## 2017-06-01 NOTE — Patient Instructions (Signed)
[facility] Discharge Instructions for Patients Receiving Chemotherapy  Today you received the following chemotherapy agents Taxol and Carboplatin.   To help prevent nausea and vomiting after your treatment, we encourage you to take your nausea medication as prescribed.    If you develop nausea and vomiting that is not controlled by your nausea medication, call the clinic.   BELOW ARE SYMPTOMS THAT SHOULD BE REPORTED IMMEDIATELY:  *FEVER GREATER THAN 100.5 F  *CHILLS WITH OR WITHOUT FEVER  NAUSEA AND VOMITING THAT IS NOT CONTROLLED WITH YOUR NAUSEA MEDICATION  *UNUSUAL SHORTNESS OF BREATH  *UNUSUAL BRUISING OR BLEEDING  TENDERNESS IN MOUTH AND THROAT WITH OR WITHOUT PRESENCE OF ULCERS  *URINARY PROBLEMS  *BOWEL PROBLEMS  UNUSUAL RASH Items with * indicate a potential emergency and should be followed up as soon as possible.  Feel free to call the clinic should you have any questions or concerns. The clinic phone number is [phone].  Please show the CHEMO ALERT CARD at check-in to the Emergency Department and triage nurse.   

## 2017-06-02 ENCOUNTER — Telehealth: Payer: Self-pay | Admitting: Nurse Practitioner

## 2017-06-02 NOTE — Telephone Encounter (Signed)
Scheduled appt per 5/3 los - unable to schedule appt for 5/16 due to capped  - left message with appt date and time and logged add on . Will contact pt when appt is added.

## 2017-06-04 ENCOUNTER — Other Ambulatory Visit: Payer: Self-pay | Admitting: Oncology

## 2017-06-07 ENCOUNTER — Telehealth: Payer: Self-pay | Admitting: *Deleted

## 2017-06-07 NOTE — Telephone Encounter (Signed)
"  Calling to schedule appointment tomorrow with Dr.Sherrill or Lattie Haw.  Call my number [phone].  Grant Todd is on a break but he is so weak, at times he seems out of breath, not eating or drinking.  No nausea or vomiting,no pain, it's like he's given up.  What ever time he needs to come in tomorrow is no problem"

## 2017-06-08 ENCOUNTER — Inpatient Hospital Stay: Payer: No Typology Code available for payment source

## 2017-06-08 ENCOUNTER — Ambulatory Visit (HOSPITAL_COMMUNITY)
Admission: RE | Admit: 2017-06-08 | Discharge: 2017-06-08 | Disposition: A | Discharge status: Home / Self Care | Payer: PRIVATE HEALTH INSURANCE | Source: Ambulatory Visit | Attending: Nurse Practitioner | Admitting: Nurse Practitioner

## 2017-06-08 ENCOUNTER — Inpatient Hospital Stay (HOSPITAL_BASED_OUTPATIENT_CLINIC_OR_DEPARTMENT_OTHER): Payer: No Typology Code available for payment source | Admitting: Nurse Practitioner

## 2017-06-08 ENCOUNTER — Other Ambulatory Visit: Payer: Self-pay

## 2017-06-08 ENCOUNTER — Telehealth: Payer: Self-pay | Admitting: Nurse Practitioner

## 2017-06-08 ENCOUNTER — Other Ambulatory Visit: Payer: Self-pay | Admitting: Nurse Practitioner

## 2017-06-08 ENCOUNTER — Telehealth: Payer: Self-pay

## 2017-06-08 VITALS — BP 107/72 | HR 81 | Temp 98.8°F | Resp 17 | Ht 68.0 in | Wt 156.7 lb

## 2017-06-08 DIAGNOSIS — R918 Other nonspecific abnormal finding of lung field: Secondary | ICD-10-CM | POA: Diagnosis not present

## 2017-06-08 DIAGNOSIS — R0600 Dyspnea, unspecified: Secondary | ICD-10-CM | POA: Insufficient documentation

## 2017-06-08 DIAGNOSIS — R05 Cough: Secondary | ICD-10-CM | POA: Insufficient documentation

## 2017-06-08 DIAGNOSIS — C7951 Secondary malignant neoplasm of bone: Secondary | ICD-10-CM

## 2017-06-08 DIAGNOSIS — C155 Malignant neoplasm of lower third of esophagus: Secondary | ICD-10-CM

## 2017-06-08 DIAGNOSIS — J90 Pleural effusion, not elsewhere classified: Secondary | ICD-10-CM | POA: Insufficient documentation

## 2017-06-08 DIAGNOSIS — R590 Localized enlarged lymph nodes: Secondary | ICD-10-CM | POA: Diagnosis not present

## 2017-06-08 DIAGNOSIS — C787 Secondary malignant neoplasm of liver and intrahepatic bile duct: Secondary | ICD-10-CM

## 2017-06-08 DIAGNOSIS — Z5111 Encounter for antineoplastic chemotherapy: Secondary | ICD-10-CM | POA: Diagnosis not present

## 2017-06-08 LAB — CMP (CANCER CENTER ONLY)
ALT: 34 U/L (ref 0–55)
AST: 31 U/L (ref 5–34)
Albumin: 2.4 g/dL — ABNORMAL LOW (ref 3.5–5.0)
Alkaline Phosphatase: 490 U/L — ABNORMAL HIGH (ref 40–150)
Anion gap: 7 (ref 3–11)
BUN: 19 mg/dL (ref 7–26)
CHLORIDE: 100 mmol/L (ref 98–109)
CO2: 30 mmol/L — ABNORMAL HIGH (ref 22–29)
Calcium: 9.2 mg/dL (ref 8.4–10.4)
Creatinine: 0.84 mg/dL (ref 0.70–1.30)
Glucose, Bld: 103 mg/dL (ref 70–140)
POTASSIUM: 4.3 mmol/L (ref 3.5–5.1)
Sodium: 137 mmol/L (ref 136–145)
Total Bilirubin: 0.3 mg/dL (ref 0.2–1.2)
Total Protein: 6 g/dL — ABNORMAL LOW (ref 6.4–8.3)

## 2017-06-08 NOTE — Telephone Encounter (Signed)
Returned call to pt wife regarding triage message below. Per wife, pt is "doing better. He is eating and drinking more today". appt offered for today or tomorrow. Pt coming today for lab/inf, but wife is willing to come back tomorrow if needed. Will consult MD.   Per Dr. Benay Spice ,

## 2017-06-08 NOTE — Telephone Encounter (Signed)
Spoke with central scheduling in regards to a STAT CT scan of the chest w/o contrast per Ned Card NP. Advised pt that this will be done at [facility].

## 2017-06-08 NOTE — Telephone Encounter (Signed)
Called patient to confirm appt for tomorrow per 5/9 los - left message with appt date and time.

## 2017-06-08 NOTE — Progress Notes (Addendum)
[place] [place] OFFICE PROGRESS NOTE   Diagnosis: Esophagus cancer  INTERVAL HISTORY:   Grant Todd returns prior to scheduled follow-up.  He continues to have dyspnea and a productive cough.  He continues to have "congestion".  No fever.  Energy level is poor.  He reports that he "sleeps a lot".  Appetite is poor.  Objective:  Vital signs in last 24 hours:  Blood pressure 107/72, pulse 81, temperature 98.8 F (37.1 C), temperature source Oral, resp. rate 17, height _0  (1.727 m), weight 156 lb 11.2 oz (71.1 kg), SpO2 96 %.    HEENT: No thrush or ulcers. Resp: Good air movement bilaterally.  Breath sounds diminished right lower lung field. Cardio: Regular rate and rhythm. GI: No hepatomegaly. Vascular: Trace pitting edema at the lower legs/ankles bilaterally. Neuro: Alert and oriented. Port-A-Cath without erythema.   Lab Results:  Lab Results  Component Value Date   WBC 8.4 06/01/2017   HGB 9.6 (L) 06/01/2017   HCT 29.9 (L) 06/01/2017   MCV 90.1 06/01/2017   PLT 396 06/01/2017   NEUTROABS 6.6 (H) 06/01/2017    Imaging:  No results found.  Medications: I have reviewed the patient's current medications.  Assessment/Plan: 1. Metastatic esophagus cancer ? Distal esophagus mass at upper endoscopy 12/15/2016, biopsy confirmed invasive poorly differentiated adenocarcinoma ? CTs of 02/16/2016-distal esophagus thickening, mediastinal/right hilar lymphadenopathy, abdominal/retroperitoneal lymphadenopathy, liver metastases ? MRI lumbar spine 12/20/2016-7 mm S1 lesion suspicious for metastasis, bulky retroperitoneal and prevertebral malignant lymphadenopathy, leftpsoasmuscle metastasis, degenerative disease causing severe neural foraminal stenosis at right L4 and left L5 ? PET scan 12/27/2016-hypermetabolic mass at the gastroesophageal junction; extensive hypermetabolic adenopathy involving the right supraclavicular nodal station, mediastinal lymph nodes,  retrocrural lymph nodes, periaortic lymph nodes, left iliac lymph nodes and central mesenteric lymph nodes; fine nodularity in the right upper lobe with hypermetabolic thickening in the right suprahilar peribronchial structures; hypermetabolic metastasis to the right hepatic lobe; several small skeletal metastasis including right iliac bone, left sacrum and left scapula; multiple soft tissue metastases to the subcutaneous tissues adjacent to the left scapular musculature, retroperitoneal nodal metastasis along the left psoas muscle and metastatic lesion within the left gluteal musculature. No lesion in the lumbar spine or spinal canal to explain the patient's acute right-sided leg pain. ? Ultrasound-guided biopsy of a right liver lesion 01/04/2017-poorly differentiated adenocarcinoma ? PD-L1 expression30% ? Cycle 1 FOLFOX 01/05/2017 ? Cycle 2 FOLFOX 01/19/2017 ? Cycle 3 FOLFOX1/03/2017 ? Cycle 4 FOLFOX 02/15/2017 ? Cycle 5 FOLFOX 03/01/2017 ? CTs 03/13/2017 decreased size and number of lymph nodes in the chest, abdomen, and retroperitoneum. Decreased size of largest liver lesion, slight increase in size of small liver lesion, slight increase in left pleural effusion, development of inflammatory appearing airspace disease ? Cycle 6 FOLFOX 03/15/2017 ? Cycle 7 FOLFOX 03/29/2017 ? Cycle 8 FOLFOX 04/17/2017 (Oxaliplatin discontinued) ? CT chest 04/21/2017-progression of thoracic and upper abdominal nodal metastases, masslike opacity in the right upper lobe-progressive with associated lymphangitic tumor spread in the right lung, new right pleural effusion, unchanged left pleural effusion ? Thoracentesis 04/24/2017-metastatic adenocarcinoma ? Cycle 1 pembrolizumab 04/26/2017 ? Cycle 1 weekly Taxol/carboplatin 05/09/2017 ? Taxol/carboplatin/Pembrolizumab 05/17/2017 ? Week 3 Taxol/carboplatin 05/24/2017 ? Week 4 Taxol/carboplatin 06/01/2017 2. Painpossibly due to disc herniation-the pain may be related to  retroperitoneal lymphadenopathy or bone metastases;plain x-ray right hip 12/21/2016 with no blastic or lytic bone lesions. No fracture or dislocation.Review of the lumbar spine MRI shows right L4 disc herniation which could be responsible for the  right leg pain and weakness. Trial of dexamethasone initiatedwith improvement.  3.Rheumatoid arthritis  4.Dysphagia/weight loss secondary to #1-improved 02/01/2017.  5.Chronic left pleural effusion  6.Port-A-Cath placement 01/04/2017  7.Early oxaliplatin neuropathy-diminished vibratory sense;mildly decreased to intact 03/29/2017.  8.Right lung inflammatory changes noted on chest CT 03/13/2017;increased cough 04/12/2017. Chest x-ray 04/12/2017 with similar appearance left chronic pleural effusion. Diffuse airspace disease right lung similar to prior chest CT.  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 Atrial fibrillation  10.  Elevated liver enzymes 06/01/2017-improved 06/08/2017     Disposition: Grant Todd performance status continues to decline.  We decided to hold the third cycle of Pembrolizumab today and refer for a restaging noncontrast CT scan of the chest.  We reviewed the chemistry panel from today.  Liver enzymes are better.  He will return for a follow-up visit 06/09/2017 to discuss the chest CT results.  Patient seen with Dr. Benay Spice.    Ned Card ANP/GNP-BC   06/08/2017  3:08 PM This was a shared visit with Ned Card.  Grant Todd has experienced a continued decline in his performance status over the past week.  Pembrolizumab will be held today.  He will be referred for a restaging chest CT and return for  an office visit on 06/09/2017.  Julieanne Manson, MD

## 2017-06-09 ENCOUNTER — Ambulatory Visit: Payer: PRIVATE HEALTH INSURANCE | Admitting: Oncology

## 2017-06-09 ENCOUNTER — Telehealth: Payer: Self-pay | Admitting: *Deleted

## 2017-06-09 NOTE — Telephone Encounter (Signed)
Call from pt's wife asking if Dr. Benay Spice can call with CT result. He was too weak to come in today. Message to providers.

## 2017-06-14 ENCOUNTER — Ambulatory Visit: Payer: PRIVATE HEALTH INSURANCE | Admitting: Acute Care

## 2017-06-15 ENCOUNTER — Ambulatory Visit: Payer: PRIVATE HEALTH INSURANCE

## 2017-06-15 ENCOUNTER — Inpatient Hospital Stay: Payer: No Typology Code available for payment source

## 2017-06-15 ENCOUNTER — Inpatient Hospital Stay: Payer: No Typology Code available for payment source | Admitting: Oncology

## 2017-06-19 ENCOUNTER — Telehealth: Payer: Self-pay

## 2017-06-19 DIAGNOSIS — C155 Malignant neoplasm of lower third of esophagus: Secondary | ICD-10-CM

## 2017-06-19 MED ORDER — DEXAMETHASONE 4 MG PO TABS: 4.0000 mg | ORAL_TABLET | Freq: Two times a day (BID) | ORAL | 0 refills | Status: AC

## 2017-06-19 MED ORDER — HYDROCODONE-HOMATROPINE 5-1.5 MG/5ML PO SYRP
5.0000 mL | ORAL_SOLUTION | Freq: Four times a day (QID) | ORAL | 0 refills | Status: AC | PRN
Start: 2017-06-19 — End: ?

## 2017-06-19 MED ORDER — MORPHINE SULFATE (CONCENTRATE) 20 MG/ML PO SOLN: ORAL | 0 refills | Status: DC

## 2017-06-19 MED ORDER — DEXAMETHASONE 4 MG PO TABS: 4.0000 mg | ORAL_TABLET | Freq: Two times a day (BID) | ORAL | 0 refills | Status: DC

## 2017-06-19 NOTE — Telephone Encounter (Addendum)
Returned call to [place] with [facility]. RN reports "increased SOB and his o2 saturation is 83% on 5L O2 via nasal cannula". Also requesting refill on hydrocodone cough syrup. Per MD Roxanol and decadron prescribed. Informed Maurine Minister that MD wanted [facility] discussed with family. Per Almyra Free, she discussed [employer] and family "would prefer to keep him at home". This RN voiced understanding and made MD aware.    [facility] pharmacy called to inform that they do not have Roxanol in stock but [facility] does. Spoke with [facility] to confirm that they have medication in stock. This RN voiced understanding. Medication faxed to [facility] and Almyra Free RN made aware.

## 2017-06-22 ENCOUNTER — Other Ambulatory Visit: Payer: PRIVATE HEALTH INSURANCE

## 2017-06-22 ENCOUNTER — Ambulatory Visit: Payer: PRIVATE HEALTH INSURANCE

## 2017-06-23 ENCOUNTER — Telehealth: Payer: Self-pay

## 2017-06-23 DIAGNOSIS — C155 Malignant neoplasm of lower third of esophagus: Secondary | ICD-10-CM

## 2017-06-23 MED ORDER — MORPHINE SULFATE (CONCENTRATE) 20 MG/ML PO SOLN: ORAL | 0 refills | Status: AC

## 2017-06-29 ENCOUNTER — Ambulatory Visit: Payer: PRIVATE HEALTH INSURANCE

## 2017-06-29 ENCOUNTER — Other Ambulatory Visit: Payer: PRIVATE HEALTH INSURANCE

## 2017-07-01 NOTE — Telephone Encounter (Signed)
Spoke with Almyra Free, RN [facility]. Almyra Free informed that pt is actively dying and they have initiated "[facility] ativan protocol". Also requested refill for roxanol. This RN voiced understanding and made MD aware. Refill sent to pharmacy.

## 2017-07-01 DEATH — deceased

## 2019-01-01 DEATH — deceased

## 2019-02-28 IMAGING — CT CT ANGIO CHEST
2 of 6 series · 18 of 46 positions shown · IV contrast (ISOVUE)
Comparison: 04/21/2017

CLINICAL DATA: Esophageal cancer. Ongoing chemotherapy. Patient
complains of shortness breath.

EXAM:
CT ANGIOGRAPHY CHEST WITH CONTRAST
TECHNIQUE: Multidetector CT imaging of the chest was performed using the
standard protocol during bolus administration of intravenous
contrast. Multiplanar CT image reconstructions and MIPs were
obtained to evaluate the vascular anatomy.
CONTRAST:  100mL [id number] IOPAMIDOL ([id number]) INJECTION 76%

[Series 5: thins · axial · 0.70mm/px · z∈[+113,+369]mm · 15 of 282 slices shown]
[im 13/282  lung]
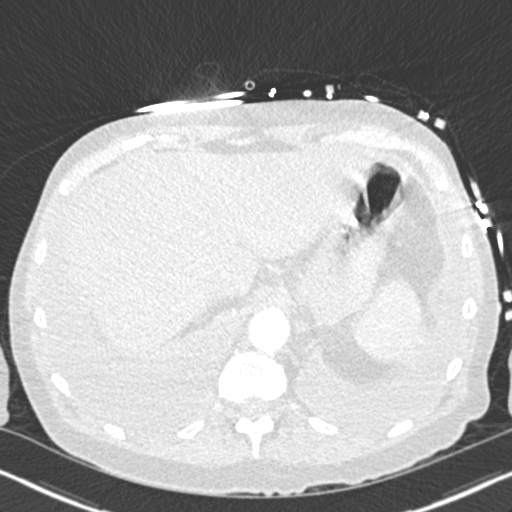
[im 37/282  soft-tissue]
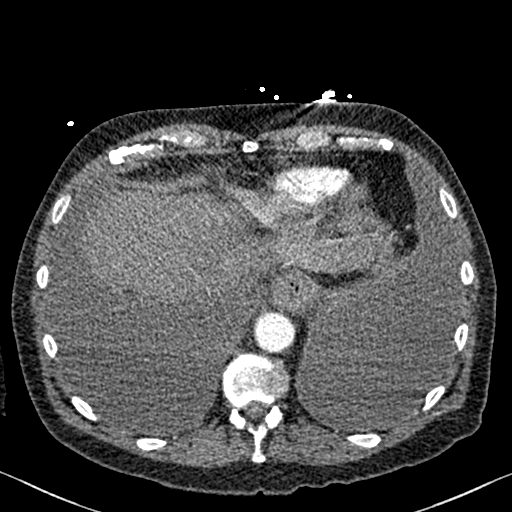
[im 49/282  lung]
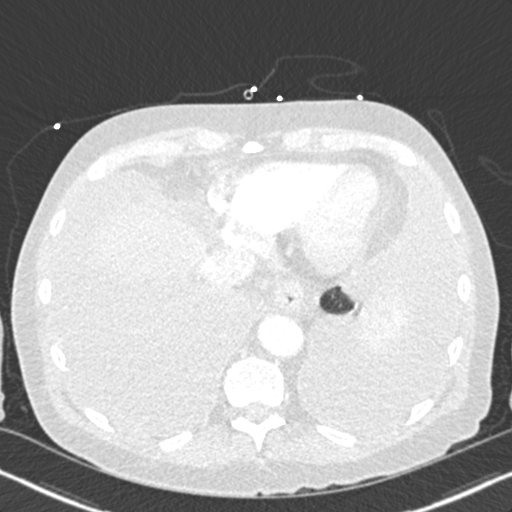
[im 74/282  soft-tissue]
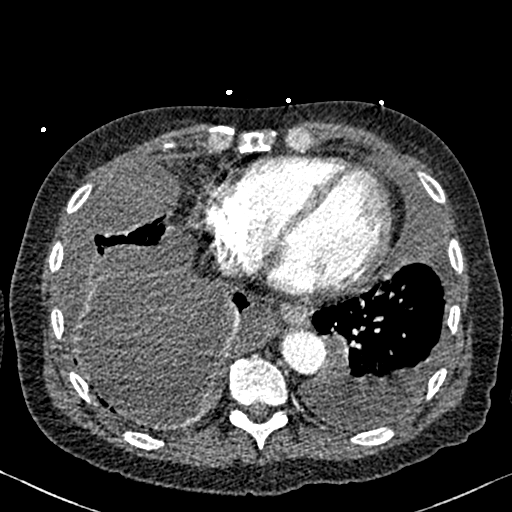
[im 86/282  lung]
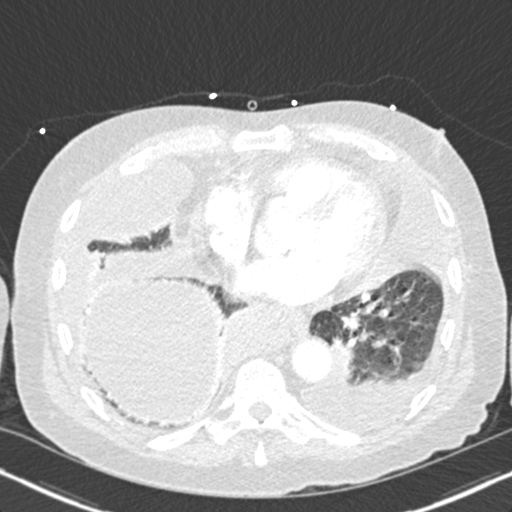
[im 110/282  soft-tissue]
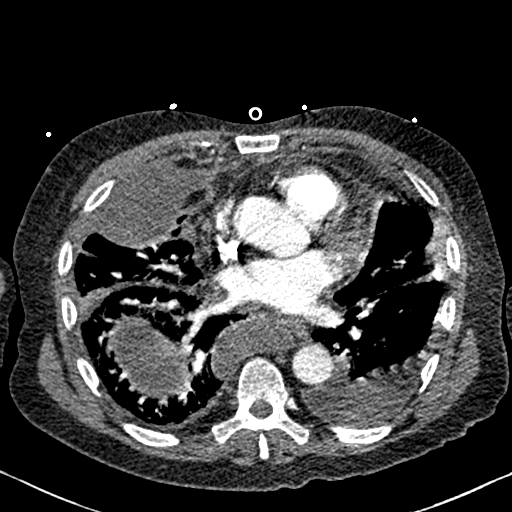
[im 123/282  lung]
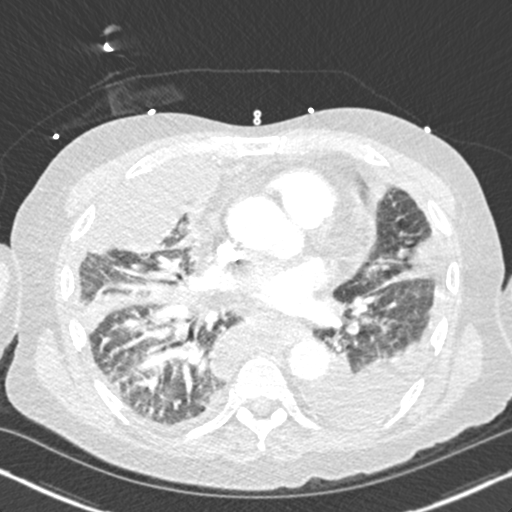
[im 147/282  soft-tissue]
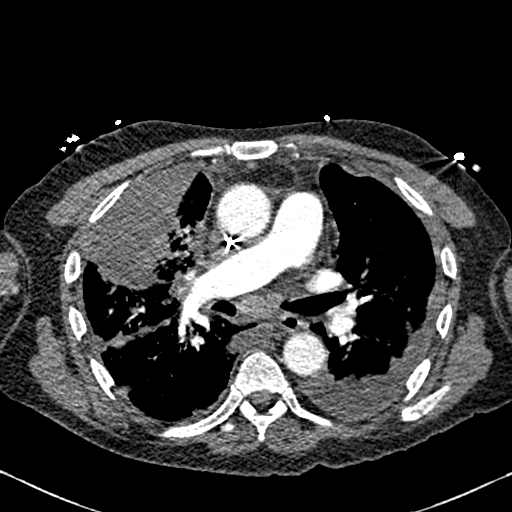
[im 159/282  lung]
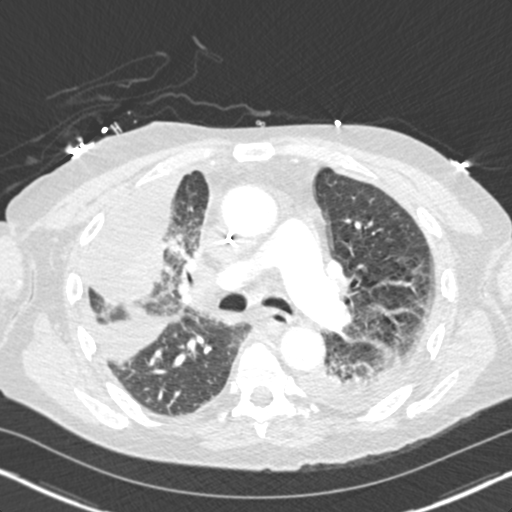
[im 172/282  soft-tissue]
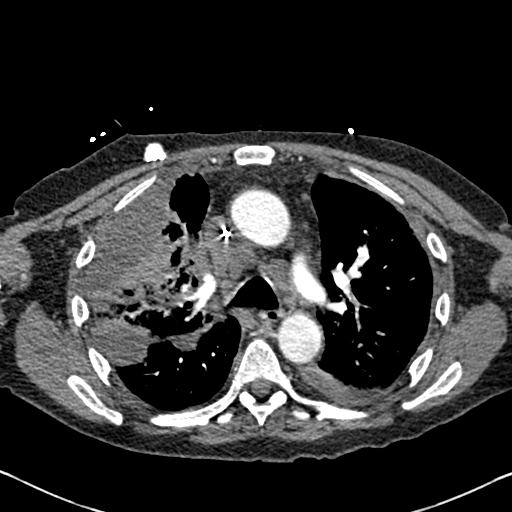
[im 196/282  lung]
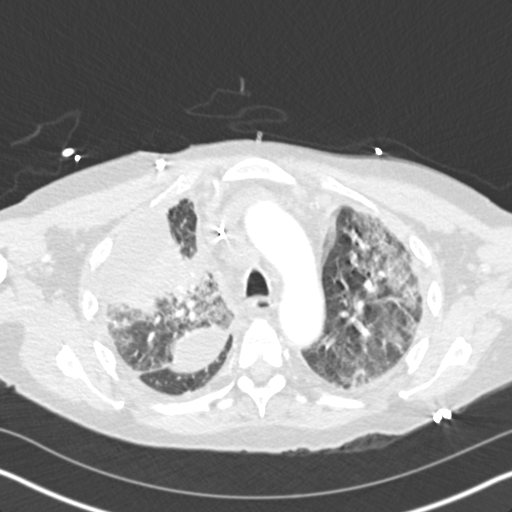
[im 208/282  soft-tissue]
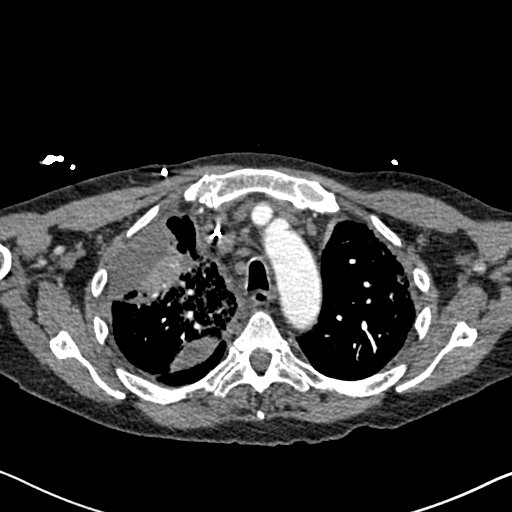
[im 233/282  lung]
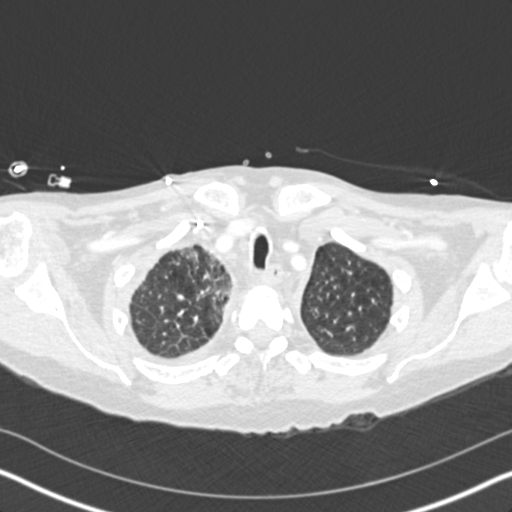
[im 245/282  soft-tissue]
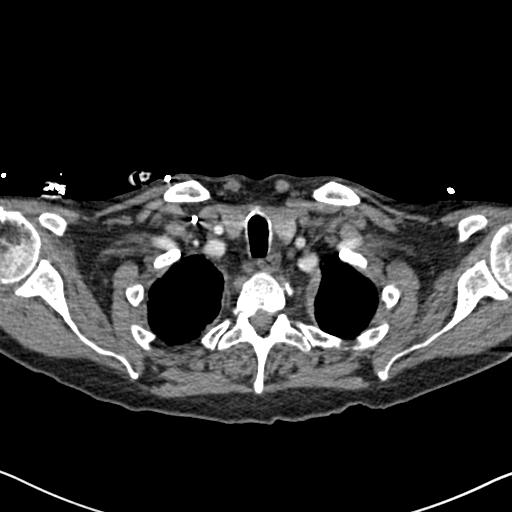
[im 269/282  lung]
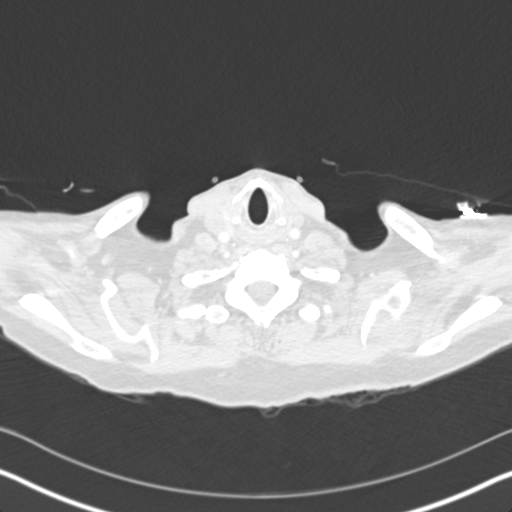

[Series 7: coronal mpr · coronal · 0.58mm/px · 3 of 151 slices shown]
[im 38/151  soft-tissue]
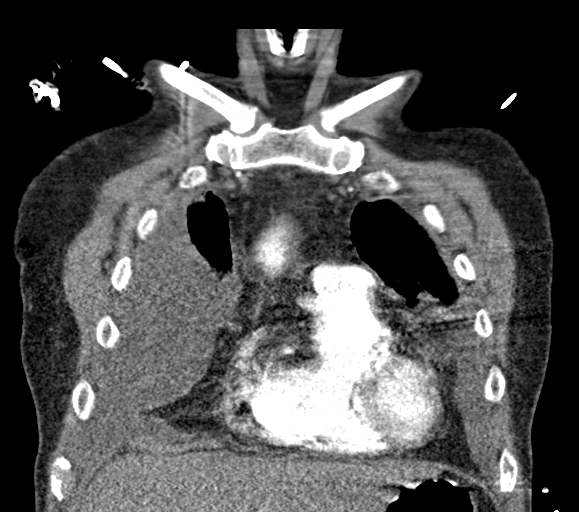
[im 76/151  soft-tissue]
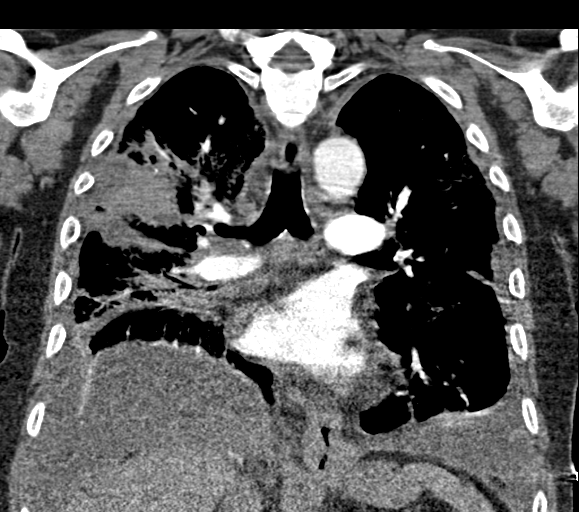
[im 113/151  soft-tissue]
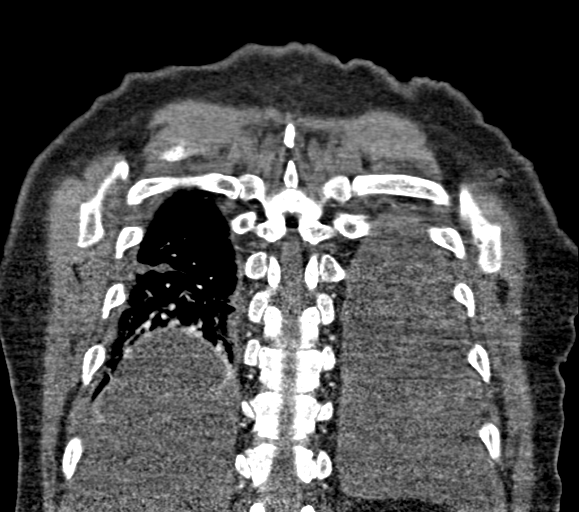

[18 of 46 positions shown; findings below may reference images not displayed]

FINDINGS: Cardiovascular: Satisfactory opacification of the pulmonary arteries
to the segmental level. No evidence of pulmonary embolism. Mildly
enlarged heart. Tiny pericardial effusion. Mild coronary artery
disease. Central venous catheter terminates in the distal superior
vena cava.

Mediastinum/Nodes: Enlarged bilateral mediastinal lymph nodes are
not significantly changed with the largest node in right pretracheal
location measuring 1.9 cm. Again seen is circumferential thickening
of the distal esophagus just above the GE junction. The thyroid is
grossly unremarkable.

Lungs/Pleura: Right upper lobe pulmonary mass appears more
fragmented and measures approximately 4.4 x 5.1 cm. There is
subpleural soft tissue thickening extending inferiorly along the
anterior pleura of the right upper lobe and right middle lobe,
likely representing extension of disease. There is persistent
interstitial thickening in the right upper lobe and right middle
lobe suggestive of lymphangitic spread of disease. Interval
development of interstitial thickening in the left lung, with upper
lobe predominance. Stable moderate in size left pleural effusion.
The right pleural effusion demonstrates loculations.

Upper Abdomen: Interval development of water density pleural
effusion, moderate.

Musculoskeletal: No chest wall abnormality. No acute or significant
osseous findings.

Review of the MIP images confirms the above findings.
IMPRESSION: No evidence of pulmonary embolus.

Previously demonstrated right upper lobe malignancy appears more
fragmented and measures slightly smaller at 5.1 cm in greatest
dimension. Progression of lymphangitic spread of disease in the
right upper and right middle lobes.

Interval development of interstitial thickening throughout the left
lung with upper lobe predominance. This may represent contralateral
lymphangitic spread of malignancy versus development of pulmonary
edema.

Interval development of loculations within the known right pleural
effusion.

Stable moderate in size left pleural effusion.

Interval development of abdominal ascites.

Stable thickening of the distal esophagus.

## 2019-03-04 IMAGING — DX DG CHEST 1V PORT
1 series · 1 of 1 positions shown · non-contrast
Comparison: 05/01/2017

CLINICAL DATA: Followup pulmonary edema.

EXAM:
PORTABLE CHEST 1 VIEW

[chest ap]
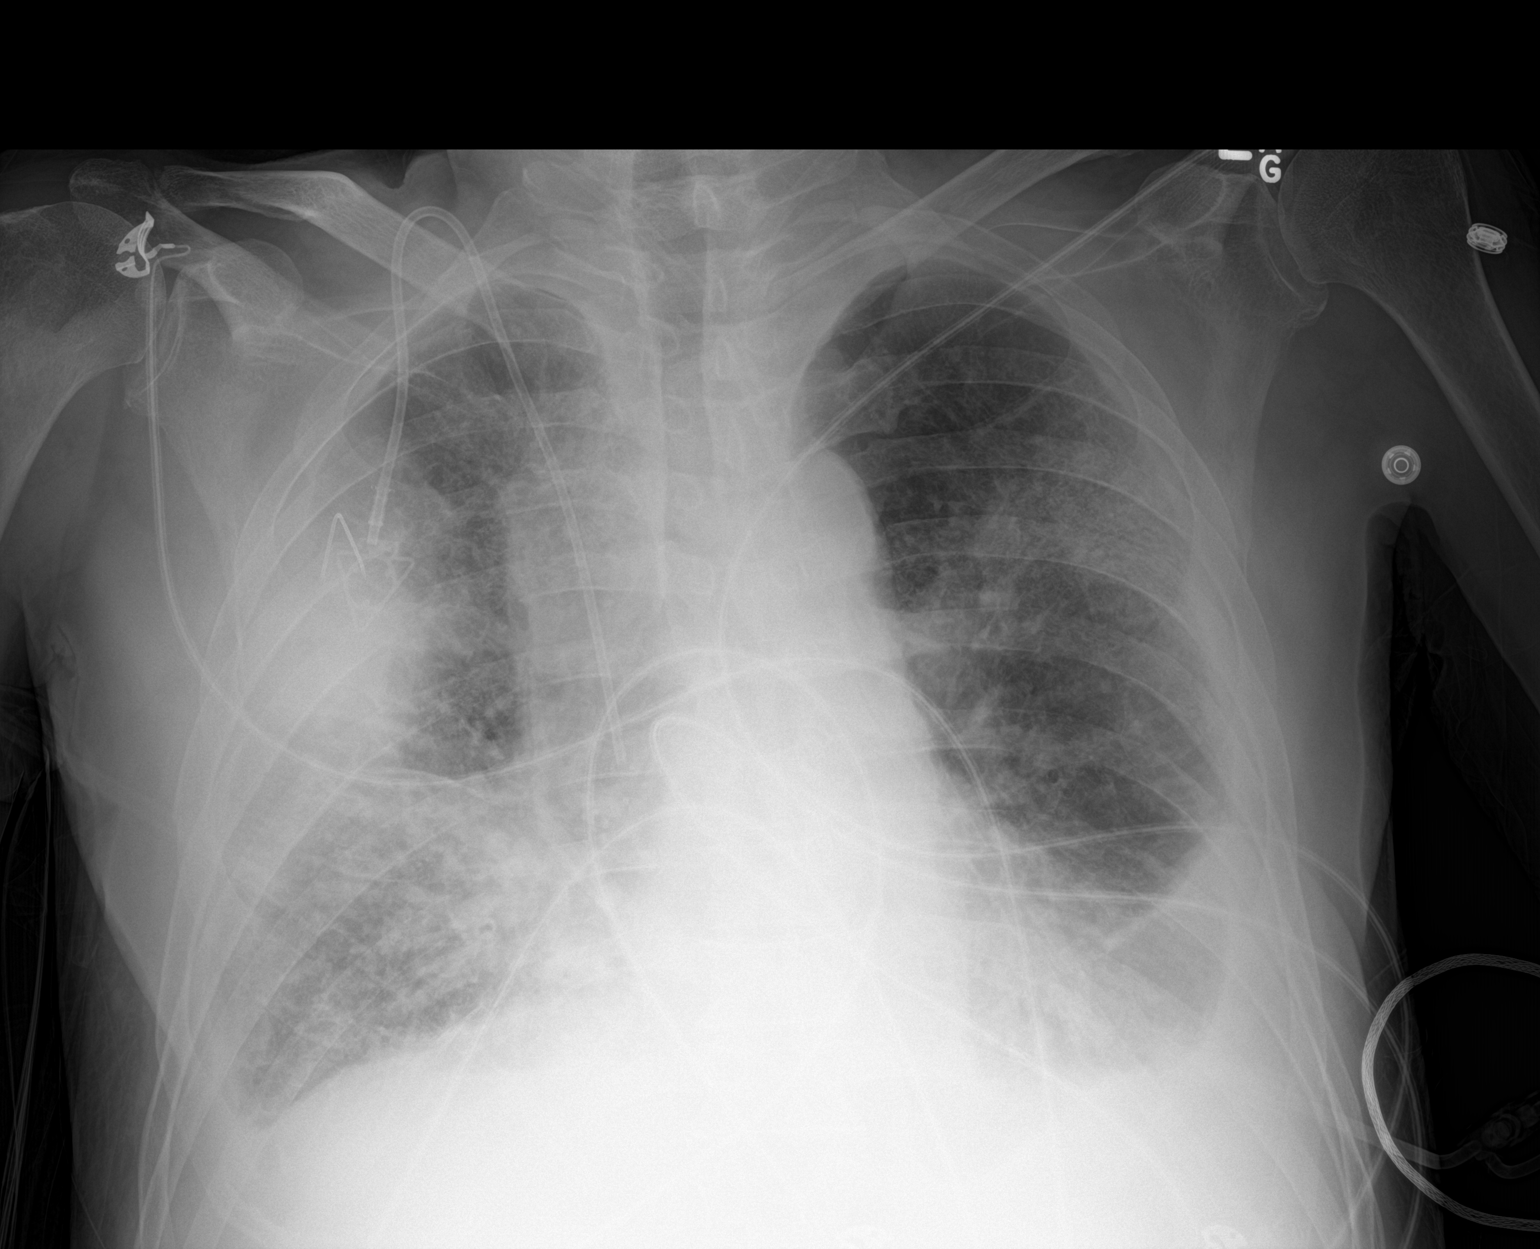

[1 of 1 positions shown; findings below may reference images not displayed]

FINDINGS: Power port on the right is unchanged with tip at the SVC RA
junction. Small pleural effusions persist. Abnormal bilateral lung
density which could be due to a combination of edema, collapse and
pneumonia persists. This is worse on the right the left. Findings
show slight worsening over time.
IMPRESSION: Slow worsening bilateral effusions, atelectasis and areas airspace
filling right worse left.

## 2019-03-06 IMAGING — DX DG CHEST 1V PORT
1 series · 1 of 1 positions shown · non-contrast
Comparison: Portable chest x-ray of 05/02/2016, and CT chest of
04/28/2017

CLINICAL DATA: History of esophageal carcinoma with metastatic
involvement of the lungs, recent chemotherapy, now with cardiac
issues

EXAM:
PORTABLE CHEST 1 VIEW

[chest ap]
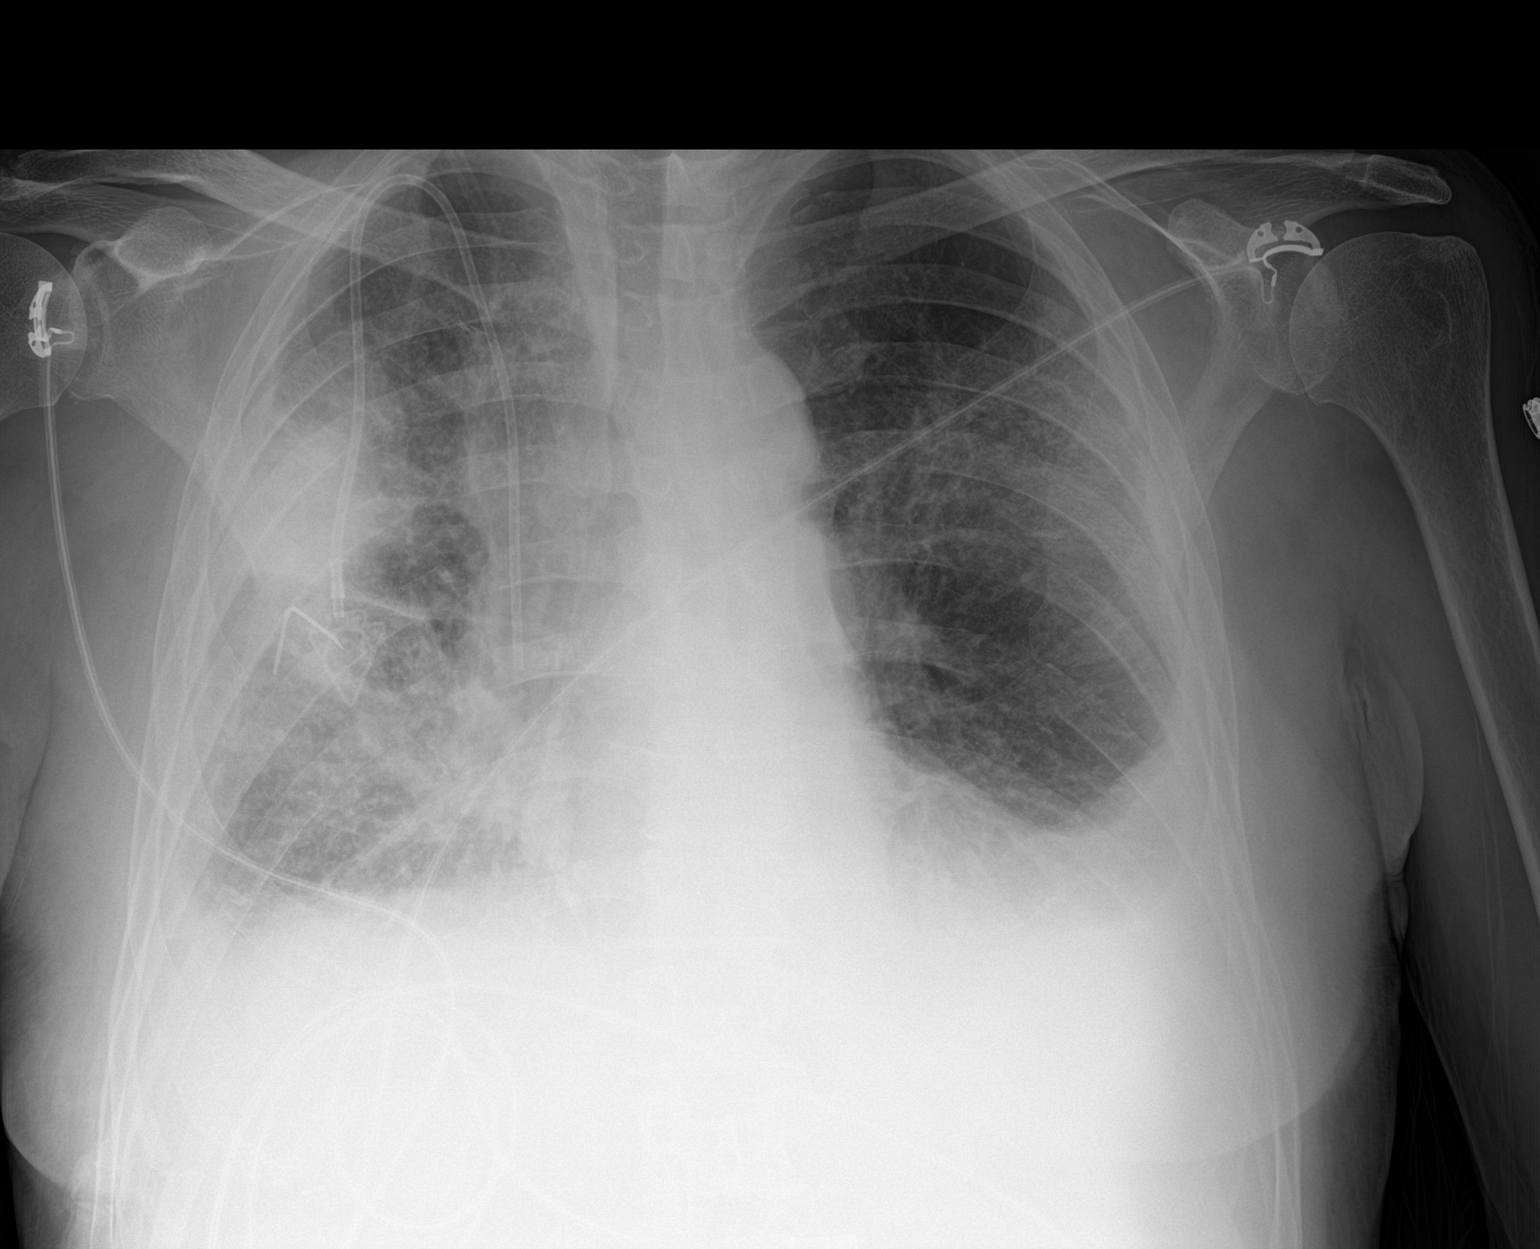

[1 of 1 positions shown; findings below may reference images not displayed]

FINDINGS: There are bilateral pleural effusions with mild cardiomegaly and
minimal congestion somewhat improved compared to the chest x-ray of
05/02/2017. These findings again are most consistent with mild CHF.
There is also parenchymal infiltrate in the right upper lobe
consistent with loculated effusion and known malignancy. Right-sided
Port-A-Cath remains with the tip overlying the lower SVC. No bony
abnormality is noted.
IMPRESSION: 1. Little change in bilateral effusions and some haziness throughout
the lungs which may indicate residual although slightly improved
CHF.
2. Parenchymal opacity in the right upper lobe peripherally
consistent with loculated effusion and known malignancy when
compared to the recent CT of the chest.
